# Patient Record
Sex: Male | Born: 1980 | Race: Black or African American | Hispanic: No | Marital: Single | Smoking: Never smoker
Health system: Southern US, Community
[De-identification: ages and names within clinical notes are randomized; demographics above are authoritative.]

## PROBLEM LIST (undated history)

## (undated) DIAGNOSIS — S92902A Unspecified fracture of left foot, initial encounter for closed fracture: Secondary | ICD-10-CM

## (undated) DIAGNOSIS — E119 Type 2 diabetes mellitus without complications: Secondary | ICD-10-CM

## (undated) DIAGNOSIS — T402X5A Adverse effect of other opioids, initial encounter: Secondary | ICD-10-CM

## (undated) DIAGNOSIS — F200 Paranoid schizophrenia: Secondary | ICD-10-CM

## (undated) DIAGNOSIS — I10 Essential (primary) hypertension: Secondary | ICD-10-CM

## (undated) DIAGNOSIS — S92901A Unspecified fracture of right foot, initial encounter for closed fracture: Secondary | ICD-10-CM

## (undated) DIAGNOSIS — K5903 Drug induced constipation: Secondary | ICD-10-CM

## (undated) HISTORY — DX: Essential (primary) hypertension: I10

## (undated) HISTORY — DX: Type 2 diabetes mellitus without complications: E11.9

## (undated) HISTORY — DX: Paranoid schizophrenia: F20.0

## (undated) HISTORY — DX: Adverse effect of other opioids, initial encounter: T40.2X5A

## (undated) HISTORY — DX: Unspecified fracture of right foot, initial encounter for closed fracture: S92.901A

## (undated) HISTORY — DX: Unspecified fracture of left foot, initial encounter for closed fracture: S92.902A

## (undated) HISTORY — DX: Drug induced constipation: K59.03

---

## 1999-08-04 ENCOUNTER — Emergency Department (HOSPITAL_COMMUNITY): Admission: EM | Admit: 1999-08-04 | Discharge: 1999-08-05 | Payer: Self-pay

## 2007-04-28 ENCOUNTER — Inpatient Hospital Stay (HOSPITAL_COMMUNITY): Admission: EM | Admit: 2007-04-28 | Discharge: 2007-05-04 | Payer: Self-pay | Admitting: Psychiatry

## 2007-05-09 ENCOUNTER — Ambulatory Visit: Payer: Self-pay | Admitting: Psychiatry

## 2007-06-14 ENCOUNTER — Emergency Department (HOSPITAL_COMMUNITY): Admission: EM | Admit: 2007-06-14 | Discharge: 2007-06-14 | Payer: Self-pay | Admitting: Emergency Medicine

## 2007-07-01 ENCOUNTER — Emergency Department (HOSPITAL_COMMUNITY): Admission: EM | Admit: 2007-07-01 | Discharge: 2007-07-02 | Payer: Self-pay | Admitting: Emergency Medicine

## 2008-03-10 ENCOUNTER — Ambulatory Visit: Payer: Self-pay | Admitting: Psychiatry

## 2008-03-10 ENCOUNTER — Emergency Department (HOSPITAL_COMMUNITY): Admission: EM | Admit: 2008-03-10 | Discharge: 2008-03-11 | Payer: Self-pay | Admitting: Emergency Medicine

## 2008-03-11 ENCOUNTER — Inpatient Hospital Stay (HOSPITAL_COMMUNITY): Admission: RE | Admit: 2008-03-11 | Discharge: 2008-03-17 | Payer: Self-pay | Admitting: Psychiatry

## 2010-05-31 LAB — CBC
HCT: 47.1 % (ref 39.0–52.0)
MCV: 87.6 fL (ref 78.0–100.0)
RBC: 5.37 MIL/uL (ref 4.22–5.81)
WBC: 9.5 10*3/uL (ref 4.0–10.5)

## 2010-05-31 LAB — COMPREHENSIVE METABOLIC PANEL
AST: 21 U/L (ref 0–37)
Alkaline Phosphatase: 60 U/L (ref 39–117)
BUN: 15 mg/dL (ref 6–23)
CO2: 25 mEq/L (ref 19–32)
Chloride: 104 mEq/L (ref 96–112)
Creatinine, Ser: 0.96 mg/dL (ref 0.4–1.5)
GFR calc non Af Amer: >60 mL/min (ref >60)
Potassium: 2.9 mEq/L — ABNORMAL LOW (ref 3.5–5.1)
Total Bilirubin: 1.9 mg/dL — ABNORMAL HIGH (ref 0.3–1.2)

## 2010-05-31 LAB — DIFFERENTIAL
Basophils Absolute: 0.1 10*3/uL (ref 0.0–0.1)
Basophils Relative: 1 % (ref 0–1)
Eosinophils Relative: 0 % (ref 0–5)
Lymphocytes Relative: 13 % (ref 12–46)
Neutro Abs: 7.3 10*3/uL (ref 1.7–7.7)

## 2010-05-31 LAB — RAPID URINE DRUG SCREEN, HOSP PERFORMED: Barbiturates: NOT DETECTED

## 2010-06-29 NOTE — Discharge Summary (Signed)
NAME:  Bob Reyes NO.:  [id number]   MEDICAL RECORD NO.:  [id number]          PATIENT TYPE:  IPS   LOCATION:  0407                          FACILITY:  [facility]   PHYSICIAN:  Anselm Jungling, MD  DATE OF BIRTH:  [birth year]   DATE OF ADMISSION:  04/28/2007  DATE OF DISCHARGE:  05/04/2007                               DISCHARGE SUMMARY   IDENTIFYING DATA/REASON FOR ADMISSION:  This was an inpatient  psychiatric admission for Bob Reyes, a 30 year old male who was admitted due  to recurrence of psychosis.  Please refer to the admission note for  further details pertaining to the symptoms, circumstances and history  that led to his hospitalization.  He was given initial an initial Axis I  diagnosis of psychosis NOS.   MEDICAL AND LABORATORY:  The patient was medically and physically  assessed by the psychiatric nurse practitioner.  He was in good health  without any chronic or active medical problems.   HOSPITAL COURSE:  The patient was admitted to the adult inpatient  psychiatric service.  He presented as an alert young man who was very  guarded and suspicious.  He was very slow in responding to questions in  the initial interview.  He appeared to be responding to internal  stimuli.   His family indicated that he had been previously treated at [facility] and had been treated there with Risperdal.   The patient was restarted on Risperdal with a plan for Risperdal 2 mg  q.h.s., and Risperdal Consta 50 mg IM every 2 weeks.  He was given  injections during the course of his inpatient stay with [place].  He was also  given Cogentin 1 mg twice daily.   The patient was initially evaluated by Dr. Geoffery Lyons.  The undersigned  assumed his care on hospital day #4.  On that day, I met with the  patient, and reviewed his care with the case manager and the nursing  staff.  The patient, at that point, continued impulsive, with  inappropriate behaviors.  That day, he tore  down the day room curtains,  not in anger, but for no apparent reason.  He showed very poor judgment  and no insight at all.  He needed one-to-one supervision.  At this  point, Risperdal was increased and Depakote was initiated because of the  appearance of this being a manic psychosis.   Unfortunately, the patient was willing to take Risperdal, but flatly  refused to take Depakote, for reasons that he was not able to make clear  to [place].  Fortunately, he did agree to continuing with Risperdal Consta,  and he did accept such an injection.  He continued highly variable and  impulsive over the next 3-4 days.   On hospital day #7, the patient was significantly improved.  He was  pleasant, calm, and had no complaints.  There were no overt symptoms of  psychosis or mania.  He had a positive family visit the night before,  and had slept well.  He was eating normally.  He was agreeable to  continuing to receive Risperdal Consta.  Following this, he was  discharged, as his family indicated they were that they were comfortable  with him coming home at that point.   AFTERCARE:  The patient was to follow-up at [facility] with an appointment on May 10, 2007, with their psychiatrist.   DISCHARGE MEDICATIONS:  1. Risperdal 2 mg q.h.s.  2. Cogentin 1 mg b.i.d.  3. Risperdal Consta 50 mg IM every 2 weeks, next one due on May 19, 2007.   DISCHARGE DIAGNOSES:  AXIS I:  Schizophrenia, paranoid type.  AXIS II:  Deferred.  AXIS III:  No acute or chronic illnesses.  AXIS IV:  Stressors severe.  AXIS V:  GAF on discharge 55.      Anselm Jungling, MD  Electronically Signed     SPB/MEDQ  D:  05/31/2007  T:  05/31/2007  Job:  [id number]

## 2010-07-02 NOTE — Discharge Summary (Signed)
NAME:  NORLAN, RANN NO.:  [id number]   MEDICAL RECORD NO.:  [id number]          PATIENT TYPE:  IPS   LOCATION:  0403                          FACILITY:  [facility]   PHYSICIAN:  Anselm Jungling, MD  DATE OF BIRTH:  01-12-1981   DATE OF ADMISSION:  03/11/2008  DATE OF DISCHARGE:  03/17/2008                               DISCHARGE SUMMARY   IDENTIFYING DATA AND REASON FOR ADMISSION:  This was the second [facility]  admission for Bob Reyes, a 30 year old single African American male with a  history of chronic schizophrenia.  He was admitted with increasing  symptoms of psychosis, after getting away from taking his medication on  a regular basis.  Please refer to the admission note for further details  pertaining to the symptoms, circumstances and history that led to his  hospitalization.  He was given an initial Axis I diagnosis of  schizophrenia, chronic paranoid type, acute exacerbation.   MEDICAL AND LABORATORY:  The patient was medically and physically  assessed by the psychiatric nurse practitioner.  He was in good health  without any active or chronic medical problems.  There were no  significant medical issues.   HOSPITAL COURSE:  The patient was admitted to the adult inpatient  psychiatric service.  He presented as a tall, slender [place]  male who showed prominent negative symptoms of schizophrenia, with  withdrawal, thought disorganization, latency, suspiciousness, and  guardedness.  However, he was cooperative.  He accepted medication when  offered, and he was restarted on an antipsychotic regimen.  He showed a  good response to Risperdal, which was restarted at a dose of 2 mg  nightly, and Risperdal Consta 25 mg IM q. 2 weeks which was given during  his stay.   The patient made gradual daily progress, becoming more spontaneous, more  verbal, and showing more in the way of insight and good judgment.   His mother was contacted and discharge and aftercare  planning were  coordinated with her.  He appeared appropriate for discharge on the  eighth hospital day.   DISCHARGE AND AFTERCARE PLANS:  The patient was to follow-up at [facility] with an appointment to see a psychiatrist on  February 2 at 3:00 p.m.   DISCHARGE MEDICATIONS:  1. Risperdal 2 mg nightly.  2. Risperdal Consta 25 mg IM q. 2 weeks, next due March 27, 2008.   DISCHARGE DIAGNOSES:  AXIS I:  Schizophrenia, chronic paranoid type,  acute exacerbation, resolving.  AXIS II:  Deferred.  AXIS III:  No acute or chronic illnesses.  AXIS IV:  Stressors severe.  AXIS V:  Global Assessment of Functioning on discharge 65.   It should be noted that the patient did have some asymptomatic  tachycardia during his stay.  Electrocardiogram did not show any acute  abnormality.  The patient was to follow-up with his usual medical  Teola Felipe regarding this.      Anselm Jungling, MD  Electronically Signed     SPB/MEDQ  D:  03/19/2008  T:  03/19/2008  Job:  [phone]

## 2010-10-15 ENCOUNTER — Emergency Department (HOSPITAL_COMMUNITY)
Admission: EM | Admit: 2010-10-15 | Discharge: 2010-10-15 | Discharge status: Against Medical Advice | Payer: Self-pay | Attending: Emergency Medicine | Admitting: Emergency Medicine

## 2010-11-08 LAB — COMPREHENSIVE METABOLIC PANEL
BUN: 10
Chloride: 103
GFR calc non Af Amer: >60
Potassium: 3.3 — ABNORMAL LOW
Sodium: 140

## 2010-11-08 LAB — CBC
HCT: 44.5
Platelets: 291
RDW: 12.8
WBC: 10.6 — ABNORMAL HIGH

## 2010-11-08 LAB — MAGNESIUM: Magnesium: 2.2

## 2010-11-09 LAB — CBC
HCT: 43.5
Hemoglobin: 15.3
MCHC: 35.2
RDW: 12.7

## 2010-11-09 LAB — DIFFERENTIAL
Basophils Absolute: 0
Eosinophils Relative: 1
Lymphocytes Relative: 19
Monocytes Absolute: 0.6

## 2010-11-09 LAB — BASIC METABOLIC PANEL
CO2: 28
GFR calc non Af Amer: >60
Glucose, Bld: 116 — ABNORMAL HIGH
Potassium: 4.2
Sodium: 137

## 2010-11-09 LAB — RAPID URINE DRUG SCREEN, HOSP PERFORMED
Barbiturates: NOT DETECTED
Opiates: NOT DETECTED

## 2010-11-10 LAB — COMPREHENSIVE METABOLIC PANEL
ALT: 17
BUN: 12
Calcium: 9.8
Creatinine, Ser: 0.96
Glucose, Bld: 106 — ABNORMAL HIGH
Sodium: 137
Total Protein: 7.7

## 2010-11-10 LAB — COMPREHENSIVE METABOLIC PANEL WITH GFR
AST: 19
Albumin: 4.6
Alkaline Phosphatase: 65
CO2: 25
Chloride: 101
GFR calc Af Amer: >60
GFR calc non Af Amer: >60
Potassium: 3.8
Total Bilirubin: 1.2

## 2010-11-10 LAB — RAPID URINE DRUG SCREEN, HOSP PERFORMED
Amphetamines: NOT DETECTED
Barbiturates: NOT DETECTED
Benzodiazepines: NOT DETECTED
Cocaine: NOT DETECTED
Opiates: NOT DETECTED
Tetrahydrocannabinol: NOT DETECTED

## 2010-11-10 LAB — CBC
HCT: 50.2
Hemoglobin: 16.9
MCHC: 33.7
MCV: 85.9
Platelets: 236
RBC: 5.84 — ABNORMAL HIGH
RDW: 12.7
WBC: 9.3

## 2010-11-10 LAB — DIFFERENTIAL
Basophils Absolute: 0
Basophils Relative: 0
Eosinophils Absolute: 0
Eosinophils Relative: 0
Lymphocytes Relative: 9 — ABNORMAL LOW
Lymphs Abs: 0.9
Monocytes Absolute: 0.5
Monocytes Relative: 5
Neutro Abs: 8 — ABNORMAL HIGH
Neutrophils Relative %: 86 — ABNORMAL HIGH

## 2010-11-10 LAB — URINALYSIS, ROUTINE W REFLEX MICROSCOPIC
Bilirubin Urine: NEGATIVE
Glucose, UA: NEGATIVE
Hgb urine dipstick: NEGATIVE
Ketones, ur: 15 — AB
Nitrite: NEGATIVE
Protein, ur: NEGATIVE
Specific Gravity, Urine: 1.028
Urobilinogen, UA: 1
pH: 6.5

## 2010-11-10 LAB — ETHANOL: Alcohol, Ethyl (B): <5

## 2014-05-31 ENCOUNTER — Encounter (HOSPITAL_COMMUNITY): Payer: Self-pay | Admitting: Emergency Medicine

## 2014-05-31 ENCOUNTER — Emergency Department (HOSPITAL_COMMUNITY)
Admission: EM | Admit: 2014-05-31 | Discharge: 2014-05-31 | Disposition: A | Discharge status: Home / Self Care | Payer: Medicaid Other | Source: Home / Self Care | Attending: Emergency Medicine | Admitting: Emergency Medicine

## 2014-05-31 DIAGNOSIS — Z760 Encounter for issue of repeat prescription: Secondary | ICD-10-CM | POA: Diagnosis not present

## 2014-05-31 DIAGNOSIS — Z8659 Personal history of other mental and behavioral disorders: Secondary | ICD-10-CM

## 2014-05-31 NOTE — ED Notes (Signed)
Pt is here needing refill on his Zyprex 15 mg for schizophrenic  Has appt w/physchiatrist on Monday Alert, no signs of acute distress

## 2014-05-31 NOTE — ED Provider Notes (Signed)
CSN: [id number]     Arrival date & time 05/31/14  1744 History   First MD Initiated Contact with Patient 05/31/14 1901     Chief Complaint  Patient presents with  . Medication Refill   (Consider location/radiation/quality/duration/timing/severity/associated sxs/prior Treatment) HPI  He is a 34 year old man here for medication question. He is on Zyprexa 15 mg daily. His current prescription has run out. He does have an appointment with his psychiatrist on Monday. He has some old pills, but they have expired 02/24/2014. He is wondering if he can take these today and tomorrow until he sees his psychiatrist.  History reviewed. No pertinent past medical history. History reviewed. No pertinent past surgical history. No family history on file. History  Substance Use Topics  . Smoking status: Never Smoker   . Smokeless tobacco: Not on file  . Alcohol Use: No    Review of Systems Negative Allergies  Review of patient's allergies indicates no known allergies.  Home Medications   Prior to Admission medications   Medication Sig Start Date End Date Taking? Authorizing Provider  OLANZapine (ZYPREXA) 15 MG tablet Take 15 mg by mouth at bedtime.   Yes Historical Provider, MD   BP 145/98 mmHg  Pulse 106  Temp(Src) 98.4 F (36.9 C) (Oral)  Resp 16  SpO2 100% Physical Exam  Constitutional: He is oriented to person, place, and time. He appears well-developed and well-nourished. No distress.  Cardiovascular: Normal rate.   Pulmonary/Chest: Effort normal.  Neurological: He is alert and oriented to person, place, and time.    ED Course  Procedures (including critical care time) Labs Review Labs Reviewed - No data to display  Imaging Review No results found.   MDM   1. Medication refill    Okay to use the Zyprexa tablets. Follow-up with psychiatrist as scheduled on Monday.    Charm RingsErin J Ion Gonnella, MD 05/31/14 [id number]

## 2014-05-31 NOTE — Discharge Instructions (Signed)
It is okay to use the Zyprexa. Follow-up with your psychiatrist as scheduled on Monday.

## 2016-06-27 ENCOUNTER — Encounter (HOSPITAL_COMMUNITY): Payer: Self-pay

## 2016-06-27 ENCOUNTER — Inpatient Hospital Stay (HOSPITAL_COMMUNITY)
Admission: EM | Admit: 2016-06-27 | Discharge: 2016-07-04 | DRG: 504 | Disposition: A | Discharge status: Skilled Nursing Facility | Payer: Medicaid Other | Attending: Oncology | Admitting: Oncology

## 2016-06-27 ENCOUNTER — Emergency Department (HOSPITAL_COMMUNITY): Payer: Medicaid Other

## 2016-06-27 ENCOUNTER — Inpatient Hospital Stay (HOSPITAL_COMMUNITY): Payer: Medicaid Other

## 2016-06-27 DIAGNOSIS — M25579 Pain in unspecified ankle and joints of unspecified foot: Secondary | ICD-10-CM | POA: Diagnosis present

## 2016-06-27 DIAGNOSIS — S92901A Unspecified fracture of right foot, initial encounter for closed fracture: Secondary | ICD-10-CM

## 2016-06-27 DIAGNOSIS — R5082 Postprocedural fever: Secondary | ICD-10-CM | POA: Diagnosis not present

## 2016-06-27 DIAGNOSIS — S92062A Displaced intraarticular fracture of left calcaneus, initial encounter for closed fracture: Principal | ICD-10-CM | POA: Diagnosis present

## 2016-06-27 DIAGNOSIS — S92061A Displaced intraarticular fracture of right calcaneus, initial encounter for closed fracture: Secondary | ICD-10-CM | POA: Diagnosis not present

## 2016-06-27 DIAGNOSIS — S92902A Unspecified fracture of left foot, initial encounter for closed fracture: Secondary | ICD-10-CM

## 2016-06-27 DIAGNOSIS — S92001A Unspecified fracture of right calcaneus, initial encounter for closed fracture: Secondary | ICD-10-CM | POA: Diagnosis not present

## 2016-06-27 DIAGNOSIS — W134XXA Fall from, out of or through window, initial encounter: Secondary | ICD-10-CM | POA: Diagnosis present

## 2016-06-27 DIAGNOSIS — I10 Essential (primary) hypertension: Secondary | ICD-10-CM | POA: Diagnosis present

## 2016-06-27 DIAGNOSIS — S92002A Unspecified fracture of left calcaneus, initial encounter for closed fracture: Secondary | ICD-10-CM

## 2016-06-27 DIAGNOSIS — W19XXXA Unspecified fall, initial encounter: Secondary | ICD-10-CM

## 2016-06-27 DIAGNOSIS — Z91040 Latex allergy status: Secondary | ICD-10-CM

## 2016-06-27 DIAGNOSIS — W1789XD Other fall from one level to another, subsequent encounter: Secondary | ICD-10-CM | POA: Diagnosis not present

## 2016-06-27 DIAGNOSIS — S92002D Unspecified fracture of left calcaneus, subsequent encounter for fracture with routine healing: Secondary | ICD-10-CM | POA: Diagnosis not present

## 2016-06-27 DIAGNOSIS — W1789XA Other fall from one level to another, initial encounter: Secondary | ICD-10-CM

## 2016-06-27 DIAGNOSIS — N62 Hypertrophy of breast: Secondary | ICD-10-CM | POA: Diagnosis not present

## 2016-06-27 DIAGNOSIS — Y92008 Other place in unspecified non-institutional (private) residence as the place of occurrence of the external cause: Secondary | ICD-10-CM | POA: Diagnosis not present

## 2016-06-27 DIAGNOSIS — F309 Manic episode, unspecified: Secondary | ICD-10-CM | POA: Diagnosis not present

## 2016-06-27 DIAGNOSIS — Z79899 Other long term (current) drug therapy: Secondary | ICD-10-CM | POA: Diagnosis not present

## 2016-06-27 DIAGNOSIS — T1491XA Suicide attempt, initial encounter: Secondary | ICD-10-CM | POA: Diagnosis not present

## 2016-06-27 DIAGNOSIS — Y9339 Activity, other involving climbing, rappelling and jumping off: Secondary | ICD-10-CM | POA: Diagnosis not present

## 2016-06-27 DIAGNOSIS — R000 Tachycardia, unspecified: Secondary | ICD-10-CM | POA: Diagnosis not present

## 2016-06-27 DIAGNOSIS — F209 Schizophrenia, unspecified: Secondary | ICD-10-CM

## 2016-06-27 DIAGNOSIS — F200 Paranoid schizophrenia: Secondary | ICD-10-CM | POA: Diagnosis present

## 2016-06-27 DIAGNOSIS — R9431 Abnormal electrocardiogram [ECG] [EKG]: Secondary | ICD-10-CM | POA: Diagnosis not present

## 2016-06-27 DIAGNOSIS — L818 Other specified disorders of pigmentation: Secondary | ICD-10-CM | POA: Diagnosis not present

## 2016-06-27 DIAGNOSIS — X80XXXA Intentional self-harm by jumping from a high place, initial encounter: Secondary | ICD-10-CM | POA: Diagnosis not present

## 2016-06-27 DIAGNOSIS — Z419 Encounter for procedure for purposes other than remedying health state, unspecified: Secondary | ICD-10-CM

## 2016-06-27 DIAGNOSIS — S92001D Unspecified fracture of right calcaneus, subsequent encounter for fracture with routine healing: Secondary | ICD-10-CM | POA: Diagnosis not present

## 2016-06-27 DIAGNOSIS — Y92009 Unspecified place in unspecified non-institutional (private) residence as the place of occurrence of the external cause: Secondary | ICD-10-CM

## 2016-06-27 LAB — CBC
HCT: 45.3 % (ref 39.0–52.0)
HEMOGLOBIN: 16.1 g/dL (ref 13.0–17.0)
MCH: 29.5 pg (ref 26.0–34.0)
MCHC: 35.5 g/dL (ref 30.0–36.0)
MCV: 83.1 fL (ref 78.0–100.0)
PLATELETS: 271 10*3/uL (ref 150–400)
RBC: 5.45 MIL/uL (ref 4.22–5.81)
RDW: 13 % (ref 11.5–15.5)
WBC: 18.2 10*3/uL — ABNORMAL HIGH (ref 4.0–10.5)

## 2016-06-27 LAB — BASIC METABOLIC PANEL
ANION GAP: 14 (ref 5–15)
BUN: 12 mg/dL (ref 6–20)
CALCIUM: 9.7 mg/dL (ref 8.9–10.3)
CO2: 22 mmol/L (ref 22–32)
Chloride: 103 mmol/L (ref 101–111)
Creatinine, Ser: 1.22 mg/dL (ref 0.61–1.24)
GFR calc Af Amer: >60 mL/min (ref >60)
GLUCOSE: 77 mg/dL (ref 65–99)
POTASSIUM: 3.6 mmol/L (ref 3.5–5.1)
SODIUM: 139 mmol/L (ref 135–145)

## 2016-06-27 LAB — DIFFERENTIAL
BASOS PCT: 0 %
Basophils Absolute: 0 10*3/uL (ref 0.0–0.1)
EOS PCT: 0 %
Eosinophils Absolute: 0 10*3/uL (ref 0.0–0.7)
LYMPHS PCT: 13 %
Lymphs Abs: 2 10*3/uL (ref 0.7–4.0)
MONO ABS: 0.7 10*3/uL (ref 0.1–1.0)
Monocytes Relative: 5 %
Neutro Abs: 12.7 10*3/uL — ABNORMAL HIGH (ref 1.7–7.7)
Neutrophils Relative %: 82 %

## 2016-06-27 LAB — ETHANOL

## 2016-06-27 MED ORDER — IBUPROFEN 800 MG PO TABS
800.0000 mg | ORAL_TABLET | Freq: Once | ORAL | Status: AC
  Administered 2016-06-27: 800 mg via ORAL
  Filled 2016-06-27: qty 1

## 2016-06-27 MED ORDER — HYDROCODONE-ACETAMINOPHEN 5-325 MG PO TABS
1.0000 | ORAL_TABLET | ORAL | Status: DC | PRN
  Administered 2016-06-28 – 2016-06-29 (×3): 1 via ORAL
  Filled 2016-06-27 (×3): qty 1

## 2016-06-27 MED ORDER — ACETAMINOPHEN 325 MG PO TABS: 650.0000 mg | ORAL_TABLET | Freq: Four times a day (QID) | ORAL | Status: DC | PRN

## 2016-06-27 MED ORDER — OXYCODONE HCL 5 MG PO TABS: 5.0000 mg | ORAL_TABLET | Freq: Once | ORAL | Status: DC

## 2016-06-27 MED ORDER — ACETAMINOPHEN 650 MG RE SUPP: 650.0000 mg | Freq: Four times a day (QID) | RECTAL | Status: DC | PRN

## 2016-06-27 MED ORDER — HEPARIN SODIUM (PORCINE) 5000 UNIT/ML IJ SOLN
5000.0000 [IU] | Freq: Three times a day (TID) | INTRAMUSCULAR | Status: DC
  Administered 2016-06-27 – 2016-06-29 (×5): 5000 [IU] via SUBCUTANEOUS
  Filled 2016-06-27 (×5): qty 1

## 2016-06-27 MED ORDER — OLANZAPINE 5 MG PO TBDP
10.0000 mg | ORAL_TABLET | Freq: Every morning | ORAL | Status: DC
  Administered 2016-06-28 – 2016-07-04 (×8): 10 mg via ORAL
  Filled 2016-06-27 (×8): qty 2
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
## 2016-06-27 NOTE — ED Notes (Signed)
Attempted report 
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
## 2016-06-27 NOTE — ED Notes (Signed)
Patient transported to X-ray 

## 2016-06-27 NOTE — Progress Notes (Signed)
Patient ID: Bob Reyes, male   DOB: 06/12/1980, 36 y.o.   MRN: [id number] Full consult to follow . Needs dressing by Ortho tech, their call back pending, needs elevation of both feet above heart and NWB.   Will be scheduled for surgery Wed. Afternoon on one foot and the other Friday. My cell [phone]
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
## 2016-06-27 NOTE — Progress Notes (Signed)
Orthopedic Tech Progress Note Patient Details:  Claris GladdenJosh M Lufkin November 20, 1980 [id number]  Ortho Devices Type of Ortho Device: Lenora BoysWatson Jones splint Ortho Device/Splint Location: As requested by Dr. Ophelia CharterYates applied Lenora BoysWatson Jones splints to pt right and left feet. Pt tolerated well.  Mother/family at bedside at the time.   (left and right feet).  Ortho Device/Splint Interventions: Application   Alvina ChouWilliams, Anhad Sheeley C 06/27/2016, 8:39 PM

## 2016-06-28 ENCOUNTER — Encounter (HOSPITAL_COMMUNITY): Payer: Self-pay | Admitting: *Deleted

## 2016-06-28 DIAGNOSIS — S92001A Unspecified fracture of right calcaneus, initial encounter for closed fracture: Secondary | ICD-10-CM

## 2016-06-28 DIAGNOSIS — F200 Paranoid schizophrenia: Secondary | ICD-10-CM

## 2016-06-28 DIAGNOSIS — S92002A Unspecified fracture of left calcaneus, initial encounter for closed fracture: Secondary | ICD-10-CM

## 2016-06-28 DIAGNOSIS — T1491XA Suicide attempt, initial encounter: Secondary | ICD-10-CM

## 2016-06-28 DIAGNOSIS — R9431 Abnormal electrocardiogram [ECG] [EKG]: Secondary | ICD-10-CM

## 2016-06-28 DIAGNOSIS — X80XXXA Intentional self-harm by jumping from a high place, initial encounter: Secondary | ICD-10-CM

## 2016-06-28 DIAGNOSIS — L818 Other specified disorders of pigmentation: Secondary | ICD-10-CM

## 2016-06-28 HISTORY — DX: Paranoid schizophrenia: F20.0

## 2016-06-28 LAB — RAPID URINE DRUG SCREEN, HOSP PERFORMED
Amphetamines: NOT DETECTED
Barbiturates: NOT DETECTED
Benzodiazepines: NOT DETECTED
Cocaine: NOT DETECTED
Opiates: NOT DETECTED
Tetrahydrocannabinol: NOT DETECTED

## 2016-06-28 LAB — HIV ANTIBODY (ROUTINE TESTING W REFLEX): HIV SCREEN 4TH GENERATION: NONREACTIVE

## 2016-06-28 NOTE — Plan of Care (Signed)
Problem: Education: Goal: Knowledge of [facility] Education information/materials will improve Outcome: Progressing POC reviewed with pt./mother.   

## 2016-06-28 NOTE — Progress Notes (Signed)
Subjective:   Procedure(s) (LRB): OPEN REDUCTION INTERNAL FIXATION (ORIF) CALCANEOUS FRACTURE (Left) Patient reports pain as moderate and severe.  Feet were not elevated  above heart level.   Objective: Vital signs in last 24 hours: Temp:  [98.1 F (36.7 C)-100.2 F (37.9 C)] 98.1 F (36.7 C) (05/15 0445) Pulse Rate:  [93-120] 93 (05/15 0445) Resp:  [13-26] 18 (05/15 0445) BP: (137-150)/(85-110) 139/85 (05/15 0445) SpO2:  [95 %-99 %] 99 % (05/15 0445)  Intake/Output from previous day: No intake/output data recorded. Intake/Output this shift: No intake/output data recorded.   Recent Labs  06/27/16 1837  HGB 16.1    Recent Labs  06/27/16 1837  WBC 18.2*  RBC 5.45  HCT 45.3  PLT 271    Recent Labs  06/27/16 1837  NA 139  K 3.6  CL 103  CO2 22  BUN 12  CREATININE 1.22  GLUCOSE 77  CALCIUM 9.7   No results for input(s): LABPT, INR in the last 72 hours.  Neurologically intact Dg Lumbar Spine Complete  Result Date: 06/27/2016 CLINICAL DATA:  Jumped from a 9 foot balcony. Lower extremity numbness and pain. EXAM: LUMBAR SPINE - COMPLETE 4+ VIEW COMPARISON:  None. FINDINGS: Five non rib-bearing lumbar-type vertebral bodies are intact and aligned with maintenance of the lumbar lordosis. Intervertebral disc heights are normal. No destructive bony lesions. Sacroiliac joints are symmetric. Included prevertebral and paraspinal soft tissue planes are non-suspicious. IMPRESSION: Negative. Electronically Signed   By: Awilda Metro M.D.   On: 06/27/2016 17:49   Dg Tibia/fibula Left  Result Date: 06/27/2016 CLINICAL DATA:  Patient reports jumping from a 11ft balcony, reports immediately after his left and right tib/fib were numb, reports now his pain is distal to both of his tib/fibs. EXAM: LEFT TIBIA AND FIBULA - 2 VIEW COMPARISON:  None. FINDINGS: No fracture of the tibia or fibula. Knee joint and ankle joint appear normal on two views. On the lateral view there is a  fracture of the calcaneus which is complex involving the posterior calcaneus as well as the neck of the calcaneus with depression of the subtalar surface. IMPRESSION: 1. Complex fracture of the calcaneus.  Recommend CT of the hindfoot. 2. No tibial fracture. Electronically Signed   By: Genevive Bi M.D.   On: 06/27/2016 16:13   Dg Tibia/fibula Right  Result Date: 06/27/2016 CLINICAL DATA:  Patient reports jumping from a 52ft balcony, reports immediately after his left and right tib/fib were numb, reports now his pain is distal to both of his tib/fibs. EXAM: RIGHT TIBIA AND FIBULA - 2 VIEW COMPARISON:  None. FINDINGS: No acute fracture or dislocation of the right tibia or fibula. No knee joint effusion. Severely comminuted fracture of the anterior, mid and posterior right calcaneus. Relative widening of the posterior subtalar joint. IMPRESSION: 1.  No acute osseous injury of the right tibia and fibula. 2. Severely comminuted fracture of the anterior, mid and posterior calcaneus. Electronically Signed   By: Elige Ko   On: 06/27/2016 16:09   Ct Foot Left Wo Contrast  Result Date: 06/27/2016 CLINICAL DATA:  Bilateral foot pain after jumping out from balcony. EXAM: CT OF THE LEFT FOOT WITHOUT CONTRAST CT OF THE RIGHT FOOT WITHOUT CONTRAST TECHNIQUE: Multidetector CT imaging of the both feet were performed according to the standard protocol. Multiplanar CT image reconstructions were also generated. COMPARISON:  Same day radiographs of the feet FINDINGS: RIGHT FOOT: An acute, comminuted Sanders type 3 AC fracture of the calcaneus with intra-articular  involvement of the posterior articular facet of the subtalar joint is noted with depressed middle component up to 5 mm. Fracture lucencies also extend into the calcaneocuboid articulation as well as the sinus tarsi without encroachment. Ligaments Suboptimally assessed by CT. Muscles and Tendons No intramuscular hemorrhage. The peroneal tendons are intact without  encroachment or entrapment. Medial tendons crossing the ankle joint are without definite entrapment. Extensor tendons crossing the ankle joint are unremarkable. Soft tissues Expected soft tissue swelling about the ankle and hindfoot secondary to the comminuted calcaneal fracture. No abnormal fluid collections. LEFT FOOT: An acute, comminuted Sanders type 3 AB fracture of the calcaneus is noted with intra-articular extension into the subtalar and calcaneocuboid articulations. Slight varus deformity of the calcaneus secondary to the fracture, series 12, image 162. The main fracture fragment is slightly depressed on the coronal images along its medial aspect by 5 mm as well. Remainder of the visualized mid foot, metatarsals and toes are intact. The distal tibia and fibula are unremarkable. The ankle mortise is congruent. Ligaments Suboptimally assessed by CT. Muscle and tendons The medial and lateral tendons crossing the ankle joint do not appear entrapped by the fracture fragments. Soft tissues: Expected soft tissue swelling about the ankle and hindfoot secondary to the comminuted calcaneal fractures. IMPRESSION: 1. Acute, closed, comminuted Sanders type 3 AC fracture the calcaneus with intra-articular involvement of the subtalar and calcaneocuboid articulations. Approximately 5 mm depression is noted of the central calcaneal fracture fragment. 2. Acute, closed, comminuted Sanders type 3 AB fracture of the calcaneus with intra-articular extension into the subtalar and calcaneocuboid articulations and also demonstrating 5 mm of depression along the medial aspect of the main central calcaneal fracture fragment. Electronically Signed   By: Tollie Eth M.D.   On: 06/27/2016 19:19   Ct Foot Right Wo Contrast  Result Date: 06/27/2016 CLINICAL DATA:  Bilateral foot pain after jumping out from balcony. EXAM: CT OF THE LEFT FOOT WITHOUT CONTRAST CT OF THE RIGHT FOOT WITHOUT CONTRAST TECHNIQUE: Multidetector CT imaging of  the both feet were performed according to the standard protocol. Multiplanar CT image reconstructions were also generated. COMPARISON:  Same day radiographs of the feet FINDINGS: RIGHT FOOT: An acute, comminuted Sanders type 3 AC fracture of the calcaneus with intra-articular involvement of the posterior articular facet of the subtalar joint is noted with depressed middle component up to 5 mm. Fracture lucencies also extend into the calcaneocuboid articulation as well as the sinus tarsi without encroachment. Ligaments Suboptimally assessed by CT. Muscles and Tendons No intramuscular hemorrhage. The peroneal tendons are intact without encroachment or entrapment. Medial tendons crossing the ankle joint are without definite entrapment. Extensor tendons crossing the ankle joint are unremarkable. Soft tissues Expected soft tissue swelling about the ankle and hindfoot secondary to the comminuted calcaneal fracture. No abnormal fluid collections. LEFT FOOT: An acute, comminuted Sanders type 3 AB fracture of the calcaneus is noted with intra-articular extension into the subtalar and calcaneocuboid articulations. Slight varus deformity of the calcaneus secondary to the fracture, series 12, image 162. The main fracture fragment is slightly depressed on the coronal images along its medial aspect by 5 mm as well. Remainder of the visualized mid foot, metatarsals and toes are intact. The distal tibia and fibula are unremarkable. The ankle mortise is congruent. Ligaments Suboptimally assessed by CT. Muscle and tendons The medial and lateral tendons crossing the ankle joint do not appear entrapped by the fracture fragments. Soft tissues: Expected soft tissue swelling about the ankle and  hindfoot secondary to the comminuted calcaneal fractures. IMPRESSION: 1. Acute, closed, comminuted Sanders type 3 AC fracture the calcaneus with intra-articular involvement of the subtalar and calcaneocuboid articulations. Approximately 5 mm  depression is noted of the central calcaneal fracture fragment. 2. Acute, closed, comminuted Sanders type 3 AB fracture of the calcaneus with intra-articular extension into the subtalar and calcaneocuboid articulations and also demonstrating 5 mm of depression along the medial aspect of the main central calcaneal fracture fragment. Electronically Signed   By: Tollie Ethavid  Kwon M.D.   On: 06/27/2016 19:19   Dg Foot Complete Left  Result Date: 06/27/2016 CLINICAL DATA:  Jumping from a balcony, left foot pain EXAM: LEFT FOOT - COMPLETE 3+ VIEW COMPARISON:  06/27/2016 FINDINGS: There is an acute comminuted fracture of the calcaneus extending into the subtalar joint articular surface. Lateral view is oblique but the calcaneus is severely flattened and deformed. The talus appears impacted upon the fractured calcaneus. No subluxation or dislocation. Diffuse soft tissue swelling. IMPRESSION: Comminuted calcaneal fracture involving the subtalar joint with soft tissue swelling. Electronically Signed   By: Judie PetitM.  Shick M.D.   On: 06/27/2016 16:16   Dg Foot Complete Right  Result Date: 06/27/2016 CLINICAL DATA:  Patient reports jumping from a 889ft balcony, reports immediately after his left and right tib/fib were numb, reports now his pain is distal to both of his tib/fibs. EXAM: RIGHT FOOT COMPLETE - 3+ VIEW COMPARISON:  None. FINDINGS: There is a comminuted fracture of the calcaneus, not well-defined due to positioning on the lateral view. This appears have an intra-articular component. No other fractures. The calcaneal articulations are not well visualized in this exam. Remaining foot articulations are normally spaced and aligned. There is diffuse mid and hindfoot soft tissue swelling. IMPRESSION: Comminuted fracture of the calcaneus not well visualized on this exam. Recommend follow-up CT for further characterization of the fracture. Electronically Signed   By: Amie Portlandavid  Ormond M.D.   On: 06/27/2016 16:12     Assessment/Plan:   Procedure(s) (LRB): OPEN REDUCTION INTERNAL FIXATION (ORIF) CALCANEOUS FRACTURE (Left) Plan:  Surgery left foot on Wednesday.   Eldred MangesMark C Duilio Heritage 06/28/2016, 7:32 AM
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
## 2016-06-28 NOTE — Progress Notes (Signed)
Subjective: Currently, the patient feels ok. Denies significant pain. Concerned about sedation, but understands need for surgery. Has feet elevated in bed, but concerned that he will not be able to use the bathroom.  Interval Events: Bilateral foot/ankle splints placed. Surgery L foot on Wed. Surgery R foot on Friday.  Objective: Vital signs in last 24 hours: Vitals:   06/27/16 1815 06/27/16 1830 06/27/16 2150 06/28/16 0445  BP:  (!) 147/110 (!) 137/97 139/85  Pulse: (!) 107 96 (!) 103 93  Resp: (!) 26 (!) 24 20 18   Temp:   99 F (37.2 C) 98.1 F (36.7 C)  TempSrc:   Oral Oral  SpO2: 98% 97% 99% 99%   Physical Exam: Physical Exam  Constitutional: He is oriented to person, place, and time. He appears well-developed and well-nourished. He is cooperative. No distress.  HENT:  Head: Normocephalic and atraumatic.  Right Ear: Hearing normal.  Left Ear: Hearing normal.  Nose: Nose normal.  Mouth/Throat: Mucous membranes are normal.  Cardiovascular: Normal rate, regular rhythm, S1 normal, S2 normal and intact distal pulses.  Exam reveals no gallop.   No murmur heard. Pulmonary/Chest: Effort normal and breath sounds normal. No respiratory distress. He has no wheezes. He has no rhonchi. He has no rales. He exhibits no tenderness.  Abdominal: Soft. Normal appearance and bowel sounds are normal. He exhibits no ascites. There is no hepatosplenomegaly. There is no tenderness.  Musculoskeletal:  Bilateral feet and ankles splinted. Able to moves toes. Sensation intact. No significant pain on exam.  Neurological: He is alert and oriented to person, place, and time. He has normal strength.  Skin: Skin is warm, dry and intact. He is not diaphoretic.  Psychiatric: His behavior is normal. His mood appears anxious. His speech is tangential (minimally).   Labs: CBC:  Recent Labs Lab 06/27/16 1837 06/27/16 2106  WBC 18.2*  --   NEUTROABS  --  12.7*  HGB 16.1  --   HCT 45.3  --   MCV 83.1   --   PLT 271  --    Metabolic Panel:  Recent Labs Lab 06/27/16 1837  NA 139  K 3.6  CL 103  CO2 22  GLUCOSE 77  BUN 12  CREATININE 1.22  CALCIUM 9.7    Medications: Scheduled Medications: . heparin  5,000 Units Subcutaneous Q8H  . OLANZapine zydis  10 mg Oral q morning - 10a  . oxyCODONE  5 mg Oral Once   PRN Medications: acetaminophen **OR** acetaminophen, HYDROcodone-acetaminophen  Assessment/Plan: Bob Reyes is a 36 y.o. male with schizophrenia who presents with bilateral calcaneal fractures after jumping off a balcony.  1) Schizophrenia: On Zyprexa 10mg , follows with [facility]. Previously on 15mg  1 year ago, but felt to be over-sedated. Mother has concerns that pt is now impulsive and hyper-active. No A/V hallucinations. Denies SI/HI. UDS negative for intoxication. Appreciate Psych assistance. - continue Zyprexa 10mg   2) Bilateral closed, comminuted calcaneal fx: After jumping off a second story balcony. Ortho c/s, appreciate recs, plan ORIF L foot Wed, R foot Fri. Splinted and elevated in bed. Strict non-wt bearing. Pain control NSAID/Percocet PRN, pt has high pain tolerance, comfortable w/o opioids today. Will appreciate CSW assistance with disposition. Pt currently lives on 3rd floot w/o elevator. Mother's residence is not wheelchair accessible. - Appreciate Ortho Recs - pain control w/ NSAIDs/Oxy PRN - PT/OT  Length of Stay: 1 day(s) Dispo: Anticipated discharge after stabilization from 2nd surgery Friday, approx 5-6 days.  Bob Comment,  MD Pager: [phone] (7AM-5PM) 06/28/2016, 9:08 AM

## 2016-06-28 NOTE — Progress Notes (Signed)
  Date: 06/28/2016  Patient name: Bob GladdenJosh M Reyes  Medical record number: [id number]  Date of birth: January 26, 1981   I have seen and evaluated Bob Reyes and discussed their care with the Residency Team. Mr. Bob GumsWinnex is a 36 year old man who presented to the ED with chief complaint of inability to walk after jumping off of a second floor balcony. I have read Dr. Alm BustardStrelow's H&P and we did not get any additional history today to supplement his documentation.  This morning, the patient is feeling well without significant pain. He only required ibuprofen overnight for pain control. He was requesting permission to get in a wheelchair to go to the toilet in the bathroom instead of using a bedpan. He is nervous about undergoing sedation for his surgery but understands the need. His mother was in the room and appreciated our psychiatry consult. We broached disposition plans today. His apartment is on the third floor and does not have an elevator. His mother's home is not wheelchair accessible and he prefers not to live with her at discharge. Her sister works at a SNF type facility and he and his mother are in agreement to looking into a temporary stay at the facility at discharge.  PMHx, Fam Hx, and/or Soc Hx : PMHx sig for schizophrenia. Soc hx : lives in 3rd floor apartment. Goes to [facility]. Never smoked.  Vitals:   06/27/16 2150 06/28/16 0445  BP: (!) 137/97 139/85  Pulse: (!) 103 93  Resp: 20 18  Temp: 99 F (37.2 C) 98.1 F (36.7 C)  NAD, lying with feet above heart Skin : warm and dry, multiple tattoos Neuro : Alert, clear speech but at times nonsensical comments, able to move feet B  I personally viewed his EKG and confirmed by reading with the official read. Sinus, Q I and aVL, RAD  Assessment and Plan: I have seen and evaluated the patient as outlined above. I agree with the formulated Assessment and Plan as detailed in the residents' note, with the following changes:   1. Schizophrenia - He  had a med adjustment to a lower dose one year ago to decrease sedation and increase energy per the mother. Records indicate that he has been stable on this dose. Due to the unusual activity leading to the need for admission and surgery, we will consult psychiatry to ensure he is on appropriate medications and dosages.  2. B calcaneal fx - surgery per orthopedics. Left foot on the 16th and right foot on the 18th. He will be at high risk for DVT and will need appropriate DVT prophylaxis peri and postoperatively.  3. Multiple tattoos - Case control studies have ID'd tattoos as an indep risk factor for Hep C infxn. He has been tested for HIV but not Hep C. Rec Hep C Ab testing.   4. Q waves in I and aVL - he is asymptomatic and low risk for CAD. W/U will not change need for surgical intervention. RCRI of 0 for a peri-operative risk of 0.4-0.5% for cardiac issues. I suspect this is an aberrant EKG.   Burns SpainButcher, Bob A, MD 5/15/201810:19 AM

## 2016-06-29 ENCOUNTER — Inpatient Hospital Stay (HOSPITAL_COMMUNITY): Payer: Medicaid Other

## 2016-06-29 ENCOUNTER — Encounter (HOSPITAL_COMMUNITY)
Admission: EM | Disposition: A | Discharge status: Skilled Nursing Facility | Payer: Self-pay | Source: Home / Self Care | Attending: Internal Medicine

## 2016-06-29 ENCOUNTER — Inpatient Hospital Stay (HOSPITAL_COMMUNITY): Payer: Medicaid Other | Admitting: Anesthesiology

## 2016-06-29 ENCOUNTER — Encounter (HOSPITAL_COMMUNITY): Payer: Self-pay | Admitting: *Deleted

## 2016-06-29 DIAGNOSIS — N62 Hypertrophy of breast: Secondary | ICD-10-CM

## 2016-06-29 DIAGNOSIS — S92902A Unspecified fracture of left foot, initial encounter for closed fracture: Secondary | ICD-10-CM

## 2016-06-29 HISTORY — PX: ORIF CALCANEOUS FRACTURE: SHX5030

## 2016-06-29 LAB — HEPATIC FUNCTION PANEL
ALBUMIN: 4 g/dL (ref 3.5–5.0)
ALT: 31 U/L (ref 17–63)
AST: 25 U/L (ref 15–41)
Alkaline Phosphatase: 70 U/L (ref 38–126)
Bilirubin, Direct: 0.2 mg/dL (ref 0.1–0.5)
Indirect Bilirubin: 1.5 mg/dL — ABNORMAL HIGH (ref 0.3–0.9)
Total Bilirubin: 1.7 mg/dL — ABNORMAL HIGH (ref 0.3–1.2)
Total Protein: 7 g/dL (ref 6.5–8.1)

## 2016-06-29 LAB — LIPID PANEL
Cholesterol: 159 mg/dL (ref 0–200)
HDL: 33 mg/dL — AB (ref >40)
LDL CALC: 90 mg/dL (ref 0–99)
Total CHOL/HDL Ratio: 4.8 RATIO
Triglycerides: 179 mg/dL — ABNORMAL HIGH (ref <150)
VLDL: 36 mg/dL (ref 0–40)

## 2016-06-29 LAB — HCV COMMENT:

## 2016-06-29 LAB — SURGICAL PCR SCREEN
MRSA, PCR: NEGATIVE
Staphylococcus aureus: POSITIVE — AB

## 2016-06-29 LAB — HEPATITIS C ANTIBODY (REFLEX)

## 2016-06-29 SURGERY — OPEN REDUCTION INTERNAL FIXATION (ORIF) CALCANEOUS FRACTURE
Anesthesia: Regional | Laterality: Left

## 2016-06-29 MED ORDER — ASPIRIN 325 MG PO TABS
325.0000 mg | ORAL_TABLET | Freq: Every day | ORAL | Status: DC
  Administered 2016-06-29 – 2016-07-04 (×6): 325 mg via ORAL
  Filled 2016-06-29 (×6): qty 1

## 2016-06-29 MED ORDER — METOCLOPRAMIDE HCL 5 MG PO TABS: 5.0000 mg | ORAL_TABLET | Freq: Three times a day (TID) | ORAL | Status: DC | PRN

## 2016-06-29 MED ORDER — HYDROMORPHONE HCL 1 MG/ML IJ SOLN: 0.5000 mg | INTRAMUSCULAR | Status: DC | PRN

## 2016-06-29 MED ORDER — HEPARIN SODIUM (PORCINE) 5000 UNIT/ML IJ SOLN: 5000.0000 [IU] | Freq: Three times a day (TID) | INTRAMUSCULAR | Status: DC

## 2016-06-29 MED ORDER — POLYETHYLENE GLYCOL 3350 17 G PO PACK: 17.0000 g | PACK | Freq: Every day | ORAL | Status: DC | PRN

## 2016-06-29 MED ORDER — ONDANSETRON HCL 4 MG/2ML IJ SOLN
INTRAMUSCULAR | Status: DC | PRN
  Administered 2016-06-29: 4 mg via INTRAVENOUS

## 2016-06-29 MED ORDER — ONDANSETRON HCL 4 MG PO TABS: 4.0000 mg | ORAL_TABLET | Freq: Four times a day (QID) | ORAL | Status: DC | PRN

## 2016-06-29 MED ORDER — HYDROMORPHONE HCL 1 MG/ML IJ SOLN: 0.2500 mg | INTRAMUSCULAR | Status: DC | PRN

## 2016-06-29 MED ORDER — LACTATED RINGERS IV SOLN
INTRAVENOUS | Status: DC
  Administered 2016-06-29 – 2016-07-01 (×2): via INTRAVENOUS

## 2016-06-29 MED ORDER — FENTANYL CITRATE (PF) 250 MCG/5ML IJ SOLN
INTRAMUSCULAR | Status: AC
  Filled 2016-06-29: qty 5

## 2016-06-29 MED ORDER — ROPIVACAINE HCL 5 MG/ML IJ SOLN
INTRAMUSCULAR | Status: DC | PRN
  Administered 2016-06-29: 30 mL via PERINEURAL

## 2016-06-29 MED ORDER — ACETAMINOPHEN 325 MG PO TABS
650.0000 mg | ORAL_TABLET | Freq: Four times a day (QID) | ORAL | Status: DC | PRN
  Administered 2016-07-01 – 2016-07-03 (×4): 650 mg via ORAL
  Filled 2016-06-29 (×4): qty 2

## 2016-06-29 MED ORDER — SODIUM CHLORIDE 0.45 % IV SOLN: INTRAVENOUS | Status: DC

## 2016-06-29 MED ORDER — FENTANYL CITRATE (PF) 100 MCG/2ML IJ SOLN
50.0000 ug | Freq: Once | INTRAMUSCULAR | Status: AC
  Administered 2016-06-29: 50 ug via INTRAVENOUS

## 2016-06-29 MED ORDER — LIDOCAINE 2% (20 MG/ML) 5 ML SYRINGE
INTRAMUSCULAR | Status: AC
  Filled 2016-06-29: qty 5

## 2016-06-29 MED ORDER — DOCUSATE SODIUM 50 MG/5ML PO LIQD
100.0000 mg | Freq: Two times a day (BID) | ORAL | Status: DC
  Administered 2016-06-29 – 2016-07-01 (×4): 100 mg via ORAL
  Filled 2016-06-29 (×8): qty 10

## 2016-06-29 MED ORDER — ROCURONIUM BROMIDE 10 MG/ML (PF) SYRINGE
PREFILLED_SYRINGE | INTRAVENOUS | Status: AC
  Filled 2016-06-29: qty 5

## 2016-06-29 MED ORDER — DOCUSATE SODIUM 100 MG PO CAPS
100.0000 mg | ORAL_CAPSULE | Freq: Two times a day (BID) | ORAL | Status: DC
  Filled 2016-06-29: qty 1

## 2016-06-29 MED ORDER — FENTANYL CITRATE (PF) 100 MCG/2ML IJ SOLN
INTRAMUSCULAR | Status: DC | PRN
Start: 2016-06-29 — End: 2016-06-29
  Administered 2016-06-29: 100 ug via INTRAVENOUS

## 2016-06-29 MED ORDER — LIDOCAINE 2% (20 MG/ML) 5 ML SYRINGE
INTRAMUSCULAR | Status: DC | PRN
  Administered 2016-06-29: 60 mg via INTRAVENOUS

## 2016-06-29 MED ORDER — METOCLOPRAMIDE HCL 5 MG/ML IJ SOLN: 5.0000 mg | Freq: Three times a day (TID) | INTRAMUSCULAR | Status: DC | PRN

## 2016-06-29 MED ORDER — ONDANSETRON HCL 4 MG/2ML IJ SOLN: 4.0000 mg | Freq: Four times a day (QID) | INTRAMUSCULAR | Status: DC | PRN

## 2016-06-29 MED ORDER — PROPOFOL 10 MG/ML IV BOLUS
INTRAVENOUS | Status: DC | PRN
  Administered 2016-06-29: 200 mg via INTRAVENOUS

## 2016-06-29 MED ORDER — MIDAZOLAM HCL 2 MG/2ML IJ SOLN
2.0000 mg | Freq: Once | INTRAMUSCULAR | Status: AC
  Administered 2016-06-29: 2 mg via INTRAVENOUS

## 2016-06-29 MED ORDER — CEFAZOLIN SODIUM-DEXTROSE 2-4 GM/100ML-% IV SOLN
INTRAVENOUS | Status: AC
  Filled 2016-06-29: qty 100

## 2016-06-29 MED ORDER — DEXAMETHASONE SODIUM PHOSPHATE 10 MG/ML IJ SOLN
INTRAMUSCULAR | Status: AC
  Filled 2016-06-29: qty 1

## 2016-06-29 MED ORDER — EPHEDRINE 5 MG/ML INJ
INTRAVENOUS | Status: AC
  Filled 2016-06-29: qty 10

## 2016-06-29 MED ORDER — OXYCODONE HCL 5 MG PO TABS
5.0000 mg | ORAL_TABLET | ORAL | Status: DC | PRN
  Administered 2016-07-01 – 2016-07-04 (×9): 5 mg via ORAL
  Filled 2016-06-29 (×7): qty 1

## 2016-06-29 MED ORDER — EPHEDRINE SULFATE-NACL 50-0.9 MG/10ML-% IV SOSY
PREFILLED_SYRINGE | INTRAVENOUS | Status: DC | PRN
  Administered 2016-06-29: 10 mg via INTRAVENOUS

## 2016-06-29 MED ORDER — ONDANSETRON HCL 4 MG/2ML IJ SOLN
INTRAMUSCULAR | Status: AC
  Filled 2016-06-29: qty 2

## 2016-06-29 MED ORDER — ROCURONIUM BROMIDE 10 MG/ML (PF) SYRINGE
PREFILLED_SYRINGE | INTRAVENOUS | Status: DC | PRN
  Administered 2016-06-29: 50 mg via INTRAVENOUS

## 2016-06-29 MED ORDER — DEXAMETHASONE SODIUM PHOSPHATE 10 MG/ML IJ SOLN
INTRAMUSCULAR | Status: DC | PRN
  Administered 2016-06-29: 5 mg via INTRAVENOUS

## 2016-06-29 MED ORDER — CEFAZOLIN SODIUM-DEXTROSE 1-4 GM/50ML-% IV SOLN
1.0000 g | Freq: Three times a day (TID) | INTRAVENOUS | Status: AC
  Administered 2016-06-29 – 2016-06-30 (×2): 1 g via INTRAVENOUS
  Filled 2016-06-29 (×2): qty 50

## 2016-06-29 MED ORDER — FENTANYL CITRATE (PF) 100 MCG/2ML IJ SOLN
INTRAMUSCULAR | Status: AC
  Administered 2016-06-29: 50 ug via INTRAVENOUS
  Filled 2016-06-29: qty 2

## 2016-06-29 MED ORDER — CEFAZOLIN SODIUM-DEXTROSE 2-3 GM-% IV SOLR
INTRAVENOUS | Status: DC | PRN
  Administered 2016-06-29: 2 g via INTRAVENOUS

## 2016-06-29 MED ORDER — SUGAMMADEX SODIUM 200 MG/2ML IV SOLN
INTRAVENOUS | Status: AC
  Filled 2016-06-29: qty 2

## 2016-06-29 MED ORDER — ACETAMINOPHEN 650 MG RE SUPP: 650.0000 mg | Freq: Four times a day (QID) | RECTAL | Status: DC | PRN

## 2016-06-29 MED ORDER — MIDAZOLAM HCL 2 MG/2ML IJ SOLN
INTRAMUSCULAR | Status: AC
  Administered 2016-06-29: 2 mg via INTRAVENOUS
  Filled 2016-06-29: qty 2

## 2016-06-29 SURGICAL SUPPLY — 68 items
BANDAGE ACE 4X5 VEL STRL LF (GAUZE/BANDAGES/DRESSINGS) IMPLANT
BANDAGE ACE 6X5 VEL STRL LF (GAUZE/BANDAGES/DRESSINGS) IMPLANT
BANDAGE ESMARK 6X9 LF (GAUZE/BANDAGES/DRESSINGS) IMPLANT
BIT DRILL 2.5X2.75 QC CALB (BIT) ×2 IMPLANT
BNDG CMPR 9X6 STRL LF SNTH (GAUZE/BANDAGES/DRESSINGS)
BNDG COHESIVE 6X5 TAN STRL LF (GAUZE/BANDAGES/DRESSINGS) IMPLANT
BNDG ESMARK 6X9 LF (GAUZE/BANDAGES/DRESSINGS)
CONNECTOR 5 IN 1 STRAIGHT STRL (MISCELLANEOUS) ×2 IMPLANT
COTTON STERILE ROLL (GAUZE/BANDAGES/DRESSINGS) ×2 IMPLANT
COVER MAYO STAND STRL (DRAPES) ×3 IMPLANT
COVER SURGICAL LIGHT HANDLE (MISCELLANEOUS) ×3 IMPLANT
CUFF TOURNIQUET SINGLE 34IN LL (TOURNIQUET CUFF) ×2 IMPLANT
CUFF TOURNIQUET SINGLE 44IN (TOURNIQUET CUFF) IMPLANT
DRAPE C-ARM 42X72 X-RAY (DRAPES) ×2 IMPLANT
DRAPE C-ARMOR (DRAPES) ×2 IMPLANT
DRAPE HALF SHEET 40X57 (DRAPES) ×3 IMPLANT
DRAPE INCISE IOBAN 66X45 STRL (DRAPES) ×1 IMPLANT
DRAPE U-SHAPE 47X51 STRL (DRAPES) ×3 IMPLANT
DRSG PAD ABDOMINAL 8X10 ST (GAUZE/BANDAGES/DRESSINGS) ×3 IMPLANT
DURAPREP 26ML APPLICATOR (WOUND CARE) ×3 IMPLANT
ELECT REM PT RETURN 9FT ADLT (ELECTROSURGICAL) ×3
ELECTRODE REM PT RTRN 9FT ADLT (ELECTROSURGICAL) ×1 IMPLANT
GAUZE SPONGE 4X4 12PLY STRL (GAUZE/BANDAGES/DRESSINGS) ×3 IMPLANT
GAUZE XEROFORM 1X8 LF (GAUZE/BANDAGES/DRESSINGS) ×2 IMPLANT
GAUZE XEROFORM 5X9 LF (GAUZE/BANDAGES/DRESSINGS) ×3 IMPLANT
GLOVE BIOGEL PI IND STRL 8 (GLOVE) ×2 IMPLANT
GLOVE BIOGEL PI INDICATOR 8 (GLOVE) ×4
GLOVE ORTHO TXT STRL SZ7.5 (GLOVE) ×6 IMPLANT
GOWN STRL REUS W/ TWL LRG LVL3 (GOWN DISPOSABLE) ×1 IMPLANT
GOWN STRL REUS W/ TWL XL LVL3 (GOWN DISPOSABLE) ×1 IMPLANT
GOWN STRL REUS W/TWL 2XL LVL3 (GOWN DISPOSABLE) ×3 IMPLANT
GOWN STRL REUS W/TWL LRG LVL3 (GOWN DISPOSABLE) ×3
GOWN STRL REUS W/TWL XL LVL3 (GOWN DISPOSABLE) ×3
KIT BASIN OR (CUSTOM PROCEDURE TRAY) ×3 IMPLANT
KIT ROOM TURNOVER OR (KITS) ×3 IMPLANT
MANIFOLD NEPTUNE II (INSTRUMENTS) ×3 IMPLANT
NS IRRIG 1000ML POUR BTL (IV SOLUTION) ×3 IMPLANT
PACK ORTHO EXTREMITY (CUSTOM PROCEDURE TRAY) ×3 IMPLANT
PAD ARMBOARD 7.5X6 YLW CONV (MISCELLANEOUS) ×6 IMPLANT
PAD CAST 4YDX4 CTTN HI CHSV (CAST SUPPLIES) ×1 IMPLANT
PADDING CAST COTTON 4X4 STRL (CAST SUPPLIES) ×3
PADDING CAST COTTON 6X4 STRL (CAST SUPPLIES) ×3 IMPLANT
PIN 4.5MM CANC THREADED AO (PIN) ×2 IMPLANT
PLATE LOCK LG LT CALC FOOT (Plate) ×2 IMPLANT
SCREW 3.5MM CORT LP 34MM (Screw) ×2 IMPLANT
SCREW CORT 3.5X40 (Screw) ×2 IMPLANT
SCREW CORT T15 30X3.5XST LCK (Screw) IMPLANT
SCREW CORTICAL 3.5X30MM (Screw) ×3 IMPLANT
SCREW CORTICAL LOW PROF 3.5X20 (Screw) ×2 IMPLANT
SCREW LOCK CORT STAR 3.5X20 (Screw) ×2 IMPLANT
SCREW LOCK CORT STAR 3.5X28 (Screw) ×4 IMPLANT
SCREW LOCK CORT STAR 3.5X30 (Screw) ×6 IMPLANT
SCREW LOCK CORT STAR 3.5X38 (Screw) ×2 IMPLANT
SCREW LOCK CORT STAR 3.5X48 (Screw) ×2 IMPLANT
SPONGE LAP 18X18 X RAY DECT (DISPOSABLE) ×3 IMPLANT
STAPLER VISISTAT 35W (STAPLE) IMPLANT
SUCTION FRAZIER HANDLE 10FR (MISCELLANEOUS) ×2
SUCTION TUBE FRAZIER 10FR DISP (MISCELLANEOUS) ×1 IMPLANT
SUT ETHILON 3 0 PS 1 (SUTURE) ×2 IMPLANT
SUT VIC AB 2-0 CT1 27 (SUTURE)
SUT VIC AB 2-0 CT1 TAPERPNT 27 (SUTURE) ×2 IMPLANT
TOWEL OR 17X24 6PK STRL BLUE (TOWEL DISPOSABLE) ×3 IMPLANT
TOWEL OR 17X26 10 PK STRL BLUE (TOWEL DISPOSABLE) ×3 IMPLANT
TUBE CONNECTING 12'X1/4 (SUCTIONS) ×2
TUBE CONNECTING 12X1/4 (SUCTIONS) ×3 IMPLANT
WATER STERILE IRR 1000ML POUR (IV SOLUTION) ×3 IMPLANT
WIRE K 1.6MM 144256 (MISCELLANEOUS) ×12 IMPLANT
YANKAUER SUCT BULB TIP NO VENT (SUCTIONS) ×3 IMPLANT

## 2016-06-29 NOTE — Anesthesia Postprocedure Evaluation (Signed)
Anesthesia Post Note  Patient: Colin BentonJosh M Yokley  Procedure(s) Performed: Procedure(s) (LRB): OPEN REDUCTION INTERNAL FIXATION (ORIF) CALCANEOUS FRACTURE (Left)  Patient location during evaluation: PACU Anesthesia Type: Regional and General Level of consciousness: awake and alert Pain management: pain level controlled Vital Signs Assessment: post-procedure vital signs reviewed and stable Respiratory status: spontaneous breathing, nonlabored ventilation and respiratory function stable Cardiovascular status: blood pressure returned to baseline and stable Postop Assessment: no signs of nausea or vomiting Anesthetic complications: no       Last Vitals:  Vitals:   06/29/16 1645 06/29/16 1700  BP: 139/89 139/89  Pulse: 93 92  Resp: 18 17  Temp:  36.7 C    Last Pain:  Vitals:   06/29/16 1415  TempSrc:   PainSc: 0-No pain                 Kyri Shader,W. EDMOND

## 2016-06-29 NOTE — Progress Notes (Signed)
To O.R. via bed

## 2016-06-29 NOTE — Progress Notes (Signed)
   Subjective: Currently, the patient feels ok. Anxious about surgery, but ready.  Interval Events: Cleared by Psych yesterday.  Objective: Vital signs in last 24 hours: Vitals:   06/28/16 0445 06/28/16 1427 06/28/16 2153 06/29/16 0422  BP: 139/85 122/68 (!) 155/89 121/74  Pulse: 93 94 80 93  Resp: 18 18 18 18   Temp: 98.1 F (36.7 C) 99.8 F (37.7 C) 98.5 F (36.9 C) 98.5 F (36.9 C)  TempSrc: Oral Oral Oral Oral  SpO2: 99% 99% 100% 99%   Physical Exam: Physical Exam  Constitutional: He is oriented to person, place, and time. He appears well-developed and well-nourished. He is cooperative. No distress.  HENT:  Head: Normocephalic and atraumatic.  Right Ear: Hearing normal.  Left Ear: Hearing normal.  Nose: Nose normal.  Mouth/Throat: Mucous membranes are normal.  Cardiovascular: Normal rate, regular rhythm, S1 normal, S2 normal and intact distal pulses.  Exam reveals no gallop.   No murmur heard. Pulmonary/Chest: Effort normal and breath sounds normal. No respiratory distress. He has no wheezes. He has no rhonchi. He has no rales. He exhibits no tenderness.  Abdominal: Soft. Normal appearance and bowel sounds are normal. He exhibits no ascites. There is no hepatosplenomegaly. There is no tenderness.  Musculoskeletal:  Bilateral feet and ankles splinted. Able to moves toes. Sensation intact. No significant pain on exam.  Neurological: He is alert and oriented to person, place, and time. He has normal strength.  Skin: Skin is warm, dry and intact. He is not diaphoretic.  Psychiatric: His behavior is normal. His mood appears anxious. His speech is tangential (minimally).   Labs: CBC:  Recent Labs Lab 06/27/16 1837 06/27/16 2106  WBC 18.2*  --   NEUTROABS  --  12.7*  HGB 16.1  --   HCT 45.3  --   MCV 83.1  --   PLT 271  --    Metabolic Panel:  Recent Labs Lab 06/27/16 1837  NA 139  K 3.6  CL 103  CO2 22  GLUCOSE 77  BUN 12  CREATININE 1.22  CALCIUM 9.7      Medications: Scheduled Medications: . heparin  5,000 Units Subcutaneous Q8H  . OLANZapine zydis  10 mg Oral q morning - 10a  . oxyCODONE  5 mg Oral Once   PRN Medications: acetaminophen **OR** acetaminophen, HYDROcodone-acetaminophen  Assessment/Plan: [name] is a 36 y.o. male with schizophrenia who presents with bilateral calcaneal fractures after jumping off a balcony.  1) Schizophrenia: On Zyprexa 10mg , follows with [facility]. Psych c/s yesterday. Recommend continuing current therapy. - continue Zyprexa 10mg   2) Bilateral closed, comminuted calcaneal fx: Plan ORIF L foot today, R foot Fri. Splinted and elevated in bed. Strict non-wt bearing. Pain control NSAID/Percocet PRN. CSW assistance with disposition. - Appreciate Ortho Recs - pain control w/ NSAIDs/Oxy PRN - PT/OT - CSW  Length of Stay: 2 day(s) Dispo: Anticipated discharge after stabilization from 2nd surgery Friday, approx 5-6 days.  Carolynn CommentStrelow, [name], MD Pager: [phone] (7AM-5PM) 06/29/2016, 7:22 AM
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
## 2016-06-29 NOTE — Op Note (Signed)
Preop diagnosis: Left comminuted displaced closed calcaneus fracture  Postop diagnosis: Same  Procedure: ORIF left calcaneus fracture. Lateral calcaneal plate.  Surgeon: Annell GreeningMark Devonta Blanford M.D.  Asst.: Zonia KiefJames Owens PA-C medically necessary and present for the entire procedure  Anesthesia Gen. plus preoperative block.  Procedure after the standard prepping draping patient lateral position with axillary roll proximal thigh tourniquet DuraPrep externally sheets and drapes were applied sterile gloves over the toe timeout procedure Ancef prophylaxis. Incision was made after sterile skin marker midway between the Achilles tendon and peroneal tendons bun the fibula extended to 90 angle and then following the inferior aspect of the calcaneus. Sharp dissection directly down the cortex of the the calcaneus was performed with direct elevation coming off the periosteum sharply with a 15 blade. It was elevated anterior to the peroneal tendons. A Schanz pin was placed in the calcaneus to restore Bohler's angle. Small well wide Hohmann retractors are used push up the anterior portion and the elevate given up to match the medial sustentacular fragment. K wires used dependent holder. Checking intermittently with the sterilely draped C-arm. Once reduction was good position the large anatomic plate was selected adjusted checked under C-arm K wires placed a hold it and then filled with locking and nonlocking screws appropriately. Nonlocking screws used to suck the plate down flush with the bone . There was good correction of varus and restoration of Boehler's angle. Couple to holes were left empty not required for fixation. Gutters were too long no screws were in the sub-talar joint. Satisfactory position and alignment. Tourniquet was deflated after about the 40 minutes irrigated with saline solution and then the closure of the skin with interrupted 2-0 nylon sutures. Skin flap viability looked good. Xeroform 4 x 4's ABDs giant  cotton roll and Coban was applied.

## 2016-06-29 NOTE — Progress Notes (Addendum)
   Subjective:   Procedure(s) (LRB): OPEN REDUCTION INTERNAL FIXATION (ORIF) CALCANEOUS FRACTURE (Left) Patient reports pain as mild.    Objective: Vital signs in last 24 hours: Temp:  [98.5 F (36.9 C)-99.8 F (37.7 C)] 98.5 F (36.9 C) (05/16 0422) Pulse Rate:  [80-94] 93 (05/16 0422) Resp:  [18] 18 (05/16 0422) BP: (121-155)/(68-89) 121/74 (05/16 0422) SpO2:  [99 %-100 %] 99 % (05/16 0422)  Intake/Output from previous day: 05/15 0701 - 05/16 0700 In: 720 [P.O.:720] Out: 650 [Urine:650] Intake/Output this shift: No intake/output data recorded.   Recent Labs  06/27/16 1837  HGB 16.1    Recent Labs  06/27/16 1837  WBC 18.2*  RBC 5.45  HCT 45.3  PLT 271    Recent Labs  06/27/16 1837  NA 139  K 3.6  CL 103  CO2 22  BUN 12  CREATININE 1.22  GLUCOSE 77  CALCIUM 9.7   No results for input(s): LABPT, INR in the last 72 hours.  Neurologically intact No results found.  Assessment/Plan:   Procedure(s) (LRB): OPEN REDUCTION INTERNAL FIXATION (ORIF) CALCANEOUS FRACTURE (Left)  Plan:  Surgery today for ORIF left calcaneus fracture. IV fluid at 75cc/hr , NPO.   SCD's  Hold heparin Eldred MangesMark C Yates 06/29/2016, 7:36 AM

## 2016-06-29 NOTE — Interval H&P Note (Signed)
History and Physical Interval Note:  06/29/2016 2:03 PM  Bob Reyes  has presented today for surgery, with the diagnosis of Left Calcaneous fracture  The various methods of treatment have been discussed with the patient and family. After consideration of risks, benefits and other options for treatment, the patient has consented to  Procedure(s): OPEN REDUCTION INTERNAL FIXATION (ORIF) CALCANEOUS FRACTURE (Left) as a surgical intervention .  The patient's history has been reviewed, patient examined, no change in status, stable for surgery.  I have reviewed the patient's chart and labs.  Questions were answered to the patient's satisfaction.     Eldred MangesMark C Terriana Barreras

## 2016-06-29 NOTE — Anesthesia Procedure Notes (Signed)
Anesthesia Regional Block: Popliteal block   Pre-Anesthetic Checklist: ,, timeout performed, Correct Patient, Correct Site, Correct Laterality, Correct Procedure,, site marked, risks and benefits discussed, Surgical consent,  Pre-op evaluation,  At surgeon's request and post-op pain management  Laterality: Left  Prep: chloraprep       Needles:  Injection technique: Single-shot  Needle Type: Echogenic Stimulator Needle     Needle Length: 9cm  Needle Gauge: 21     Additional Needles:   Procedures: ultrasound guided,,,,,,,,   Nerve Stimulator or Paresthesia:  Response: Quadriceps muscle contraction, 0.45 mA,   Additional Responses:   Narrative:  Start time: 06/29/2016 2:15 PM End time: 06/29/2016 2:25 PM Injection made incrementally with aspirations every 5 mL.  Performed by: Personally  Anesthesiologist: Karna ChristmasELLENDER, RYAN P  Additional Notes: Functioning IV was confirmed and monitors were applied.  A 90mm 21ga Arrow echogenic stimulator needle was used. Sterile prep,hand hygiene and sterile gloves were used.  Negative aspiration and negative test dose prior to incremental administration of local anesthetic. The patient tolerated the procedure well.

## 2016-06-29 NOTE — Transfer of Care (Signed)
Immediate Anesthesia Transfer of Care Note  Patient: Colin BentonJosh M Picchi  Procedure(s) Performed: Procedure(s): OPEN REDUCTION INTERNAL FIXATION (ORIF) CALCANEOUS FRACTURE (Left)  Patient Location: PACU  Anesthesia Type:GA combined with regional for post-op pain  Level of Consciousness: awake, oriented and patient cooperative  Airway & Oxygen Therapy: Patient Spontanous Breathing and Patient connected to nasal cannula oxygen  Post-op Assessment: Report given to RN, Post -op Vital signs reviewed and stable and Patient moving all extremities  Post vital signs: Reviewed and stable  Last Vitals:  Vitals:   06/29/16 1415 06/29/16 1641  BP: (!) 147/102   Pulse: (!) 125 (!) 112  Resp: (!) 24 18  Temp:  37.1 C    Last Pain:  Vitals:   06/29/16 1415  TempSrc:   PainSc: 0-No pain      Patients Stated Pain Goal: 2 (06/28/16 1755)  Complications: No apparent anesthesia complications

## 2016-06-29 NOTE — Anesthesia Preprocedure Evaluation (Addendum)
Anesthesia Evaluation  Patient identified by MRN, date of birth, ID band Patient awake    Reviewed: Allergy & Precautions, NPO status , Patient's Chart, lab work & pertinent test results  Airway Mallampati: II  TM Distance: >3 FB Neck ROM: Full    Dental no notable dental hx.    Pulmonary neg pulmonary ROS,    Pulmonary exam normal breath sounds clear to auscultation       Cardiovascular negative cardio ROS Normal cardiovascular exam Rhythm:Regular Rate:Normal     Neuro/Psych Schizophrenia negative neurological ROS     GI/Hepatic negative GI ROS, Neg liver ROS,   Endo/Other  negative endocrine ROS  Renal/GU negative Renal ROS  negative genitourinary   Musculoskeletal negative musculoskeletal ROS (+)   Abdominal   Peds negative pediatric ROS (+)  Hematology negative hematology ROS (+)   Anesthesia Other Findings   Reproductive/Obstetrics negative OB ROS                             Anesthesia Physical Anesthesia Plan  ASA: II  Anesthesia Plan: General and Regional   Post-op Pain Management: GA combined w/ Regional for post-op pain   Induction: Intravenous  Airway Management Planned: Oral ETT  Additional Equipment:   Intra-op Plan:   Post-operative Plan: Extubation in OR  Informed Consent: I have reviewed the patients History and Physical, chart, labs and discussed the procedure including the risks, benefits and alternatives for the proposed anesthesia with the patient or authorized representative who has indicated his/her understanding and acceptance.   Dental advisory given  Plan Discussed with: CRNA and Surgeon  Anesthesia Plan Comments:         Anesthesia Quick Evaluation

## 2016-06-29 NOTE — Brief Op Note (Signed)
06/27/2016 - 06/29/2016  4:52 PM  PATIENT:  Colin BentonJosh M Delorey  36 y.o. male  PRE-OPERATIVE DIAGNOSIS:  Left Calcaneous fracture  POST-OPERATIVE DIAGNOSIS:  Left Calcaneous fracture  PROCEDURE:  Procedure(s): OPEN REDUCTION INTERNAL FIXATION (ORIF) CALCANEOUS FRACTURE (Left)  SURGEON:  Surgeon(s) and Role:    * Eldred MangesYates, Mark C, MD - Primary  PHYSICIAN ASSISTANT: Zonia Kiefjames Joylene Wescott     ANESTHESIA:   general  EBL:  No intake/output data recorded.  BLOOD ADMINISTERED:none  DRAINS: none   LOCAL MEDICATIONS USED:  NONE  SPECIMEN:  No Specimen  DISPOSITION OF SPECIMEN:  N/A  COUNTS:  YES  TOURNIQUET:   Total Tourniquet Time Documented: Thigh (Left) - 52 minutes Total: Thigh (Left) - 52 minutes   PATIENT DISPOSITION:  PACU - hemodynamically stable.

## 2016-06-29 NOTE — Anesthesia Procedure Notes (Signed)
Procedure Name: Intubation Date/Time: 06/29/2016 2:56 PM Performed by: Melina Copa, Raychelle Hudman R Pre-anesthesia Checklist: Patient identified, Emergency Drugs available, Suction available and Patient being monitored Patient Re-evaluated:Patient Re-evaluated prior to inductionOxygen Delivery Method: Circle System Utilized Preoxygenation: Pre-oxygenation with 100% oxygen Intubation Type: IV induction Ventilation: Mask ventilation without difficulty Laryngoscope Size: Mac and 4 Grade View: Grade I Tube type: Oral Tube size: 8.0 mm Number of attempts: 1 Airway Equipment and Method: Stylet Placement Confirmation: ETT inserted through vocal cords under direct vision,  positive ETCO2 and breath sounds checked- equal and bilateral Secured at: 22 cm Tube secured with: Tape Dental Injury: Teeth and Oropharynx as per pre-operative assessment

## 2016-06-30 ENCOUNTER — Encounter (HOSPITAL_COMMUNITY): Payer: Self-pay | Admitting: Orthopaedic Surgery

## 2016-06-30 ENCOUNTER — Other Ambulatory Visit (INDEPENDENT_AMBULATORY_CARE_PROVIDER_SITE_OTHER): Payer: Self-pay | Admitting: Orthopaedic Surgery

## 2016-06-30 LAB — BASIC METABOLIC PANEL
ANION GAP: 12 (ref 5–15)
BUN: 11 mg/dL (ref 6–20)
CHLORIDE: 98 mmol/L — AB (ref 101–111)
CO2: 26 mmol/L (ref 22–32)
Calcium: 9 mg/dL (ref 8.9–10.3)
Creatinine, Ser: 1.14 mg/dL (ref 0.61–1.24)
GFR calc Af Amer: >60 mL/min (ref >60)
GLUCOSE: 119 mg/dL — AB (ref 65–99)
POTASSIUM: 3.5 mmol/L (ref 3.5–5.1)
Sodium: 136 mmol/L (ref 135–145)

## 2016-06-30 LAB — PROLACTIN: PROLACTIN: 23.5 ng/mL — AB (ref 4.0–15.2)

## 2016-06-30 MED ORDER — CEFAZOLIN SODIUM-DEXTROSE 2-4 GM/100ML-% IV SOLN
2.0000 g | Freq: Once | INTRAVENOUS | Status: AC
  Administered 2016-07-01: 2 g via INTRAVENOUS
  Filled 2016-06-30: qty 100

## 2016-06-30 NOTE — Progress Notes (Signed)
   Subjective: 1 Day Post-Op Procedure(s) (LRB): OPEN REDUCTION INTERNAL FIXATION (ORIF) CALCANEOUS FRACTURE (Left) Patient reports pain as mild.    Objective: Vital signs in last 24 hours: Temp:  [98.1 F (36.7 C)-99.8 F (37.7 C)] 99.1 F (37.3 C) (05/17 0300) Pulse Rate:  [92-125] 105 (05/17 0300) Resp:  [17-25] 18 (05/17 0300) BP: (119-158)/(73-104) 147/88 (05/17 0300) SpO2:  [96 %-100 %] 98 % (05/17 0300) Weight:  [225 lb (102.1 kg)] 225 lb (102.1 kg) (05/16 1341)  Intake/Output from previous day: 05/16 0701 - 05/17 0700 In: 462 [P.O.:240; I.V.:172; IV Piggyback:50] Out: 400 [Urine:400] Intake/Output this shift: No intake/output data recorded.   Recent Labs  06/27/16 1837  HGB 16.1    Recent Labs  06/27/16 1837  WBC 18.2*  RBC 5.45  HCT 45.3  PLT 271    Recent Labs  06/27/16 1837 06/30/16 0546  NA 139 136  K 3.6 3.5  CL 103 98*  CO2 22 26  BUN 12 11  CREATININE 1.22 1.14  GLUCOSE 77 119*  CALCIUM 9.7 9.0   No results for input(s): LABPT, INR in the last 72 hours.  Neurologically intact Dg Os Calcis Left  Result Date: 06/29/2016 CLINICAL DATA:  ORIF left calcaneus. EXAM: LEFT OS CALCIS - 2+ VIEW COMPARISON:  CT 06/27/2016.  Left foot 06/27/2016. FINDINGS: Plate and screw fixation of left calcaneal fracture with near anatomic alignment. Hardware intact. Two images obtained. 0 minutes 43 seconds fluoroscopy time . IMPRESSION: ORIF left calcaneal fracture. Electronically Signed   By: Maisie Fushomas  Register   On: 06/29/2016 16:28   Dg C-arm 1-60 Min  Result Date: 06/29/2016 CLINICAL DATA:  ORIF left calcaneal fracture. EXAM: DG C-ARM 61-120 MIN COMPARISON:  CT 06/27/2016.  Left foot 06/27/2016 FINDINGS: ORIF left calcaneal fracture. Hardware intact. Near anatomic alignment. Two images obtained. 0 minutes 43 seconds fluoroscopy time . IMPRESSION: ORIF left calcaneal fracture. Electronically Signed   By: Maisie Fushomas  Register   On: 06/29/2016 16:23     Assessment/Plan: 1 Day Post-Op Procedure(s) (LRB): OPEN REDUCTION INTERNAL FIXATION (ORIF) CALCANEOUS FRACTURE (Left) Plan:  Surgery on right calcaneus on Friday AM.   NPO after MN  Eldred MangesMark C Yates 06/30/2016, 7:36 AM

## 2016-06-30 NOTE — Progress Notes (Addendum)
   Subjective: Currently, the patient is doing well POD 1 of 1/2 ORIF procedures. No pain. No other complaints. Had BM this AM. Eating well.  Objective: Vital signs in last 24 hours: Vitals:   06/29/16 1700 06/29/16 2014 06/30/16 0013 06/30/16 0300  BP: 139/89 119/85 134/74 (!) 147/88  Pulse: 92 (!) 110 94 (!) 105  Resp: 17 18 18 18   Temp: 98.1 F (36.7 C) 99.2 F (37.3 C) 98.2 F (36.8 C) 99.1 F (37.3 C)  TempSrc:  Oral Oral Oral  SpO2: 100% 96% 98% 98%  Weight:      Height:       Physical Exam: Physical Exam  Constitutional: He is oriented to person, place, and time. He appears well-developed and well-nourished. He is cooperative. No distress.  HENT:  Head: Normocephalic and atraumatic.  Right Ear: Hearing normal.  Left Ear: Hearing normal.  Nose: Nose normal.  Mouth/Throat: Mucous membranes are normal.  Cardiovascular: Normal rate, regular rhythm, S1 normal, S2 normal and intact distal pulses.  Exam reveals no gallop.   No murmur heard. Pulmonary/Chest: Effort normal and breath sounds normal. No respiratory distress. He has no wheezes. He has no rhonchi. He has no rales. He exhibits no tenderness.  Abdominal: Soft. Normal appearance and bowel sounds are normal. He exhibits no ascites. There is no hepatosplenomegaly. There is no tenderness.  Musculoskeletal:  R foot in splint and wrap. L ankle in sterile dressing from OR.  Neurological: He is alert and oriented to person, place, and time. He has normal strength.  Skin: Skin is warm, dry and intact. He is not diaphoretic.  Psychiatric: His behavior is normal.   Labs: CBC:  Recent Labs Lab 06/27/16 1837 06/27/16 2106  WBC 18.2*  --   NEUTROABS  --  12.7*  HGB 16.1  --   HCT 45.3  --   MCV 83.1  --   PLT 271  --    Metabolic Panel:  Recent Labs Lab 06/27/16 1837 06/29/16 0654 06/30/16 0546  NA 139  --  136  K 3.6  --  3.5  CL 103  --  98*  CO2 22  --  26  GLUCOSE 77  --  119*  BUN 12  --  11    CREATININE 1.22  --  1.14  CALCIUM 9.7  --  9.0  ALT  --  31  --   ALKPHOS  --  70  --   BILITOT  --  1.7*  --   PROT  --  7.0  --   ALBUMIN  --  4.0  --     Medications: Scheduled Medications: . aspirin  325 mg Oral Daily  . docusate  100 mg Oral BID  . OLANZapine zydis  10 mg Oral q morning - 10a   PRN Medications: acetaminophen **OR** acetaminophen, HYDROmorphone (DILAUDID) injection, metoCLOPramide **OR** metoCLOPramide (REGLAN) injection, oxyCODONE, polyethylene glycol  Assessment/Plan: Mr. Bob GladdenJosh M Reyes is a 36 y.o. male with schizophrenia who presents with bilateral calcaneal fractures after jumping off a balcony.  1) Schizophrenia: Stable. - continue Zyprexa 10mg   2) Bilateral closed, comminuted calcaneal fx: s/p ORIF, plan R foot Fri. Appreciate Ortho management. - pain control post-op per Ortho - PT/OT - CSW for Dispo  Length of Stay: 3 day(s) Dispo: Anticipated discharge after stabilization surgery Friday, approx 3-4 days.  Carolynn CommentStrelow, Quinterius Gaida, MD Pager: [phone] (7AM-5PM) 06/30/2016, 11:20 AM

## 2016-06-30 NOTE — NC FL2 (Signed)
[place] MEDICAID FL2 LEVEL OF CARE SCREENING TOOL     IDENTIFICATION  Patient Name: Bob Reyes Birthdate: [birth year] Sex: male Admission Date (Current Location): 06/27/2016  [facility] [place]   Facility and Address:  [facility], [address], [place], [place] [address]      Provider Number: [id number]  Attending Physician Name and Address:  Burns SpainButcher, Anniston Nellums A, MD  Relative Name and Phone Number:       Current Level of Care: Hospital Recommended Level of Care: Skilled Nursing Facility Prior Approval Number:    Date Approved/Denied:   PASRR Number:    Discharge Plan: SNF    Current Diagnoses: Patient Active Problem List   Diagnosis Date Noted  . Closed fracture of bone of left foot   . Paranoid schizophrenia, chronic condition (HCC) 06/28/2016  . Bilateral calcaneal fractures 06/27/2016    Orientation RESPIRATION BLADDER Height & Weight     Self, Time, Situation, Place  Normal Continent Weight: 225 lb (102.1 kg) Height:  6\' 5"  (195.6 cm)  BEHAVIORAL SYMPTOMS/MOOD NEUROLOGICAL BOWEL NUTRITION STATUS      Continent    AMBULATORY STATUS COMMUNICATION OF NEEDS Skin   Limited Assist Verbally Surgical wounds                       Personal Care Assistance Level of Assistance  Bathing, Dressing Bathing Assistance: Limited assistance   Dressing Assistance: Limited assistance     Functional Limitations Info             SPECIAL CARE FACTORS FREQUENCY  PT (By licensed PT), OT (By licensed OT)     PT Frequency: 5x/wk OT Frequency: 5x/wk            Contractures      Additional Factors Info  Code Status, Allergies, Psychotropic Code Status Info: full Allergies Info: nka Psychotropic Info: Zyprexa 10 mg         Current Medications (06/30/2016):  This is the current hospital active medication list Current Facility-Administered Medications  Medication Dose Route Frequency Provider Last Rate  Last Dose  . 0.45 % sodium chloride infusion   Intravenous Continuous Eldred MangesYates, Mark C, MD      . acetaminophen (TYLENOL) tablet 650 mg  650 mg Oral Q6H PRN Naida Sleightwens, James M, PA-C       Or  . acetaminophen (TYLENOL) suppository 650 mg  650 mg Rectal Q6H PRN Naida Sleightwens, James M, PA-C      . aspirin tablet 325 mg  325 mg Oral Daily Zonia KiefOwens, James M, PA-C   325 mg at 06/30/16 1135  . docusate (COLACE) 50 MG/5ML liquid 100 mg  100 mg Oral BID Burns SpainButcher, Babara Buffalo A, MD   100 mg at 06/30/16 1135  . HYDROmorphone (DILAUDID) injection 0.5 mg  0.5 mg Intravenous Q3H PRN Zonia Kiefwens, James M, PA-C      . lactated ringers infusion   Intravenous Continuous Ellender, Catheryn Baconyan P, MD 10 mL/hr at 06/29/16 1342    . metoCLOPramide (REGLAN) tablet 5-10 mg  5-10 mg Oral Q8H PRN Naida Sleightwens, James M, PA-C       Or  . metoCLOPramide (REGLAN) injection 5-10 mg  5-10 mg Intravenous Q8H PRN Zonia Kiefwens, James M, PA-C      . OLANZapine zydis (ZYPREXA) disintegrating tablet 10 mg  10 mg Oral q morning - 10a John Giovanniathore, Vasundhra, MD   10 mg at 06/30/16 1136  . oxyCODONE (Oxy IR/ROXICODONE) immediate release tablet  5 mg  5 mg Oral Q4H PRN Naida Sleight, PA-C      . polyethylene glycol (MIRALAX / GLYCOLAX) packet 17 g  17 g Oral Daily PRN Naida Sleight, PA-C         Discharge Medications: Please see discharge summary for a list of discharge medications.  Relevant Imaging Results:  Relevant Lab Results:   Additional Information SS#: [id number]  Baldemar Lenis, LCSW

## 2016-07-01 ENCOUNTER — Encounter (HOSPITAL_COMMUNITY)
Admission: EM | Disposition: A | Discharge status: Skilled Nursing Facility | Payer: Self-pay | Source: Home / Self Care | Attending: Internal Medicine

## 2016-07-01 ENCOUNTER — Inpatient Hospital Stay (HOSPITAL_COMMUNITY): Payer: Medicaid Other | Admitting: Anesthesiology

## 2016-07-01 ENCOUNTER — Inpatient Hospital Stay (HOSPITAL_COMMUNITY): Payer: Medicaid Other

## 2016-07-01 ENCOUNTER — Encounter (HOSPITAL_COMMUNITY): Payer: Self-pay | Admitting: Anesthesiology

## 2016-07-01 DIAGNOSIS — S92901A Unspecified fracture of right foot, initial encounter for closed fracture: Secondary | ICD-10-CM

## 2016-07-01 DIAGNOSIS — S92002D Unspecified fracture of left calcaneus, subsequent encounter for fracture with routine healing: Secondary | ICD-10-CM

## 2016-07-01 DIAGNOSIS — F203 Undifferentiated schizophrenia: Secondary | ICD-10-CM

## 2016-07-01 DIAGNOSIS — R030 Elevated blood-pressure reading, without diagnosis of hypertension: Secondary | ICD-10-CM

## 2016-07-01 DIAGNOSIS — S92001D Unspecified fracture of right calcaneus, subsequent encounter for fracture with routine healing: Secondary | ICD-10-CM

## 2016-07-01 DIAGNOSIS — R5082 Postprocedural fever: Secondary | ICD-10-CM

## 2016-07-01 DIAGNOSIS — R000 Tachycardia, unspecified: Secondary | ICD-10-CM

## 2016-07-01 DIAGNOSIS — W1789XD Other fall from one level to another, subsequent encounter: Secondary | ICD-10-CM

## 2016-07-01 HISTORY — PX: ORIF CALCANEOUS FRACTURE: SHX5030

## 2016-07-01 LAB — BASIC METABOLIC PANEL
Anion gap: 9 (ref 5–15)
BUN: 15 mg/dL (ref 6–20)
CALCIUM: 8.6 mg/dL — AB (ref 8.9–10.3)
CHLORIDE: 102 mmol/L (ref 101–111)
CO2: 26 mmol/L (ref 22–32)
CREATININE: 1.09 mg/dL (ref 0.61–1.24)
Glucose, Bld: 110 mg/dL — ABNORMAL HIGH (ref 65–99)
Potassium: 3.4 mmol/L — ABNORMAL LOW (ref 3.5–5.1)
SODIUM: 137 mmol/L (ref 135–145)

## 2016-07-01 SURGERY — OPEN REDUCTION INTERNAL FIXATION (ORIF) CALCANEOUS FRACTURE
Anesthesia: Regional | Laterality: Right

## 2016-07-01 MED ORDER — MIDAZOLAM HCL 2 MG/2ML IJ SOLN
2.0000 mg | Freq: Once | INTRAMUSCULAR | Status: AC
  Administered 2016-07-01: 2 mg via INTRAVENOUS

## 2016-07-01 MED ORDER — LIDOCAINE HCL (CARDIAC) 20 MG/ML IV SOLN
INTRAVENOUS | Status: DC | PRN
  Administered 2016-07-01: 50 mg via INTRAVENOUS

## 2016-07-01 MED ORDER — CEFAZOLIN SODIUM-DEXTROSE 2-3 GM-% IV SOLR
INTRAVENOUS | Status: DC | PRN
  Administered 2016-07-01: 2 g via INTRAVENOUS

## 2016-07-01 MED ORDER — MIDAZOLAM HCL 2 MG/2ML IJ SOLN
INTRAMUSCULAR | Status: AC
  Filled 2016-07-01: qty 2

## 2016-07-01 MED ORDER — SUGAMMADEX SODIUM 200 MG/2ML IV SOLN
INTRAVENOUS | Status: DC | PRN
  Administered 2016-07-01: 200 mg via INTRAVENOUS

## 2016-07-01 MED ORDER — ONDANSETRON HCL 4 MG/2ML IJ SOLN
INTRAMUSCULAR | Status: DC | PRN
  Administered 2016-07-01: 4 mg via INTRAVENOUS

## 2016-07-01 MED ORDER — CEFAZOLIN SODIUM-DEXTROSE 1-4 GM/50ML-% IV SOLN
1.0000 g | Freq: Three times a day (TID) | INTRAVENOUS | Status: AC
  Administered 2016-07-01 (×2): 1 g via INTRAVENOUS
  Filled 2016-07-01 (×2): qty 50

## 2016-07-01 MED ORDER — FENTANYL CITRATE (PF) 100 MCG/2ML IJ SOLN
INTRAMUSCULAR | Status: AC
  Administered 2016-07-01: 50 ug via INTRAVENOUS
  Filled 2016-07-01: qty 2

## 2016-07-01 MED ORDER — ROCURONIUM BROMIDE 10 MG/ML (PF) SYRINGE
PREFILLED_SYRINGE | INTRAVENOUS | Status: AC
Start: 2016-07-01 — End: 2016-07-01
  Filled 2016-07-01: qty 5

## 2016-07-01 MED ORDER — DEXAMETHASONE SODIUM PHOSPHATE 10 MG/ML IJ SOLN
INTRAMUSCULAR | Status: DC | PRN
  Administered 2016-07-01: 10 mg via INTRAVENOUS

## 2016-07-01 MED ORDER — FENTANYL CITRATE (PF) 100 MCG/2ML IJ SOLN
50.0000 ug | Freq: Once | INTRAMUSCULAR | Status: AC
  Administered 2016-07-01: 50 ug via INTRAVENOUS

## 2016-07-01 MED ORDER — CHLORHEXIDINE GLUCONATE CLOTH 2 % EX PADS
6.0000 | MEDICATED_PAD | Freq: Every day | CUTANEOUS | Status: DC
  Administered 2016-07-02 – 2016-07-04 (×3): 6 via TOPICAL

## 2016-07-01 MED ORDER — FENTANYL CITRATE (PF) 250 MCG/5ML IJ SOLN
INTRAMUSCULAR | Status: AC
  Filled 2016-07-01: qty 5

## 2016-07-01 MED ORDER — MIDAZOLAM HCL 2 MG/2ML IJ SOLN
INTRAMUSCULAR | Status: AC
  Administered 2016-07-01: 2 mg via INTRAVENOUS
  Filled 2016-07-01: qty 2

## 2016-07-01 MED ORDER — FENTANYL CITRATE (PF) 100 MCG/2ML IJ SOLN
INTRAMUSCULAR | Status: DC | PRN
  Administered 2016-07-01: 50 ug via INTRAVENOUS

## 2016-07-01 MED ORDER — OXYCODONE HCL 5 MG/5ML PO SOLN: 5.0000 mg | Freq: Once | ORAL | Status: DC | PRN

## 2016-07-01 MED ORDER — ONDANSETRON HCL 4 MG/2ML IJ SOLN
INTRAMUSCULAR | Status: AC
  Filled 2016-07-01: qty 2

## 2016-07-01 MED ORDER — HYDROMORPHONE HCL 1 MG/ML IJ SOLN: 0.2500 mg | INTRAMUSCULAR | Status: DC | PRN

## 2016-07-01 MED ORDER — PROMETHAZINE HCL 25 MG/ML IJ SOLN: 6.2500 mg | INTRAMUSCULAR | Status: DC | PRN

## 2016-07-01 MED ORDER — OXYCODONE HCL 5 MG PO TABS
5.0000 mg | ORAL_TABLET | Freq: Once | ORAL | Status: DC | PRN
  Filled 2016-07-01 (×2): qty 1

## 2016-07-01 MED ORDER — PROPOFOL 10 MG/ML IV BOLUS
INTRAVENOUS | Status: DC | PRN
  Administered 2016-07-01: 200 mg via INTRAVENOUS
  Administered 2016-07-01: 50 mg via INTRAVENOUS

## 2016-07-01 MED ORDER — CEFAZOLIN SODIUM-DEXTROSE 2-4 GM/100ML-% IV SOLN
INTRAVENOUS | Status: AC
  Filled 2016-07-01: qty 100

## 2016-07-01 MED ORDER — DEXAMETHASONE SODIUM PHOSPHATE 10 MG/ML IJ SOLN
INTRAMUSCULAR | Status: AC
  Filled 2016-07-01: qty 1

## 2016-07-01 MED ORDER — 0.9 % SODIUM CHLORIDE (POUR BTL) OPTIME
TOPICAL | Status: DC | PRN
  Administered 2016-07-01: 1000 mL

## 2016-07-01 MED ORDER — ROPIVACAINE HCL 5 MG/ML IJ SOLN
INTRAMUSCULAR | Status: DC | PRN
  Administered 2016-07-01: 30 mL via PERINEURAL

## 2016-07-01 MED ORDER — PROPOFOL 10 MG/ML IV BOLUS
INTRAVENOUS | Status: AC
  Filled 2016-07-01: qty 40

## 2016-07-01 MED ORDER — SUGAMMADEX SODIUM 200 MG/2ML IV SOLN
INTRAVENOUS | Status: AC
  Filled 2016-07-01: qty 2

## 2016-07-01 MED ORDER — LACTATED RINGERS IV SOLN
INTRAVENOUS | Status: DC
  Administered 2016-07-01: 10:00:00 via INTRAVENOUS

## 2016-07-01 MED ORDER — ONDANSETRON HCL 4 MG/2ML IJ SOLN: 4.0000 mg | Freq: Four times a day (QID) | INTRAMUSCULAR | Status: DC | PRN

## 2016-07-01 MED ORDER — ROCURONIUM BROMIDE 100 MG/10ML IV SOLN
INTRAVENOUS | Status: DC | PRN
  Administered 2016-07-01: 10 mg via INTRAVENOUS
  Administered 2016-07-01: 50 mg via INTRAVENOUS

## 2016-07-01 MED ORDER — BUPIVACAINE HCL (PF) 0.25 % IJ SOLN
INTRAMUSCULAR | Status: AC
  Filled 2016-07-01: qty 30

## 2016-07-01 MED ORDER — LIDOCAINE 2% (20 MG/ML) 5 ML SYRINGE
INTRAMUSCULAR | Status: AC
  Filled 2016-07-01: qty 5

## 2016-07-01 MED ORDER — SODIUM CHLORIDE 0.45 % IV SOLN
INTRAVENOUS | Status: DC
  Administered 2016-07-01 – 2016-07-04 (×3): via INTRAVENOUS
  Filled 2016-07-01 (×8): qty 1000

## 2016-07-01 MED ORDER — MUPIROCIN 2 % EX OINT
1.0000 "application " | TOPICAL_OINTMENT | Freq: Two times a day (BID) | CUTANEOUS | Status: DC
  Administered 2016-07-01 – 2016-07-04 (×6): 1 via NASAL
  Filled 2016-07-01: qty 22

## 2016-07-01 MED ORDER — ONDANSETRON HCL 4 MG PO TABS: 4.0000 mg | ORAL_TABLET | Freq: Four times a day (QID) | ORAL | Status: DC | PRN

## 2016-07-01 SURGICAL SUPPLY — 77 items
BANDAGE ACE 4X5 VEL STRL LF (GAUZE/BANDAGES/DRESSINGS) IMPLANT
BANDAGE ACE 6X5 VEL STRL LF (GAUZE/BANDAGES/DRESSINGS) IMPLANT
BANDAGE ESMARK 6X9 LF (GAUZE/BANDAGES/DRESSINGS) IMPLANT
BIT DRILL 2.5X2.75 QC CALB (BIT) ×2 IMPLANT
BIT DRILL CALIBRATED 2.7 (BIT) ×1 IMPLANT
BIT DRILL CALIBRATED 2.7MM (BIT) ×1
BNDG CMPR 9X6 STRL LF SNTH (GAUZE/BANDAGES/DRESSINGS) ×1
BNDG COHESIVE 6X5 TAN STRL LF (GAUZE/BANDAGES/DRESSINGS) ×2 IMPLANT
BNDG ESMARK 6X9 LF (GAUZE/BANDAGES/DRESSINGS) ×3
COTTON STERILE ROLL (GAUZE/BANDAGES/DRESSINGS) ×2 IMPLANT
COVER MAYO STAND STRL (DRAPES) ×3 IMPLANT
COVER SURGICAL LIGHT HANDLE (MISCELLANEOUS) ×3 IMPLANT
CUFF TOURNIQUET SINGLE 34IN LL (TOURNIQUET CUFF) ×2 IMPLANT
CUFF TOURNIQUET SINGLE 44IN (TOURNIQUET CUFF) IMPLANT
DRAPE C-ARM 42X72 X-RAY (DRAPES) ×2 IMPLANT
DRAPE C-ARMOR (DRAPES) ×2 IMPLANT
DRAPE HALF SHEET 40X57 (DRAPES) ×3 IMPLANT
DRAPE INCISE IOBAN 66X45 STRL (DRAPES) ×3 IMPLANT
DRAPE U-SHAPE 47X51 STRL (DRAPES) ×3 IMPLANT
DRSG PAD ABDOMINAL 8X10 ST (GAUZE/BANDAGES/DRESSINGS) ×3 IMPLANT
DURAPREP 26ML APPLICATOR (WOUND CARE) ×3 IMPLANT
ELECT REM PT RETURN 9FT ADLT (ELECTROSURGICAL) ×3
ELECTRODE REM PT RTRN 9FT ADLT (ELECTROSURGICAL) ×1 IMPLANT
GAUZE SPONGE 4X4 12PLY STRL (GAUZE/BANDAGES/DRESSINGS) ×3 IMPLANT
GAUZE SPONGE 4X4 12PLY STRL LF (GAUZE/BANDAGES/DRESSINGS) ×2 IMPLANT
GAUZE XEROFORM 5X9 LF (GAUZE/BANDAGES/DRESSINGS) ×3 IMPLANT
GLOVE BIOGEL PI IND STRL 8 (GLOVE) ×2 IMPLANT
GLOVE BIOGEL PI INDICATOR 8 (GLOVE) ×4
GLOVE ORTHO TXT STRL SZ7.5 (GLOVE) ×6 IMPLANT
GOWN STRL REUS W/ TWL LRG LVL3 (GOWN DISPOSABLE) ×1 IMPLANT
GOWN STRL REUS W/ TWL XL LVL3 (GOWN DISPOSABLE) ×1 IMPLANT
GOWN STRL REUS W/TWL 2XL LVL3 (GOWN DISPOSABLE) ×3 IMPLANT
GOWN STRL REUS W/TWL LRG LVL3 (GOWN DISPOSABLE) ×3
GOWN STRL REUS W/TWL XL LVL3 (GOWN DISPOSABLE) ×3
K-WIRE ACE 1.6X6 (WIRE) ×6
KIT BASIN OR (CUSTOM PROCEDURE TRAY) ×3 IMPLANT
KIT ROOM TURNOVER OR (KITS) ×3 IMPLANT
KWIRE ACE 1.6X6 (WIRE) IMPLANT
MANIFOLD NEPTUNE II (INSTRUMENTS) ×3 IMPLANT
NS IRRIG 1000ML POUR BTL (IV SOLUTION) ×3 IMPLANT
PACK ORTHO EXTREMITY (CUSTOM PROCEDURE TRAY) ×3 IMPLANT
PAD ARMBOARD 7.5X6 YLW CONV (MISCELLANEOUS) ×6 IMPLANT
PAD CAST 4YDX4 CTTN HI CHSV (CAST SUPPLIES) ×1 IMPLANT
PADDING CAST COTTON 4X4 STRL (CAST SUPPLIES) ×3
PADDING CAST COTTON 6X4 STRL (CAST SUPPLIES) ×3 IMPLANT
PIN 4.5MM CANC THREADED AO (PIN) ×2 IMPLANT
PLATE LOCK LG RT CALC FOOT (Plate) ×2 IMPLANT
PUTTY DBM STAGRAFT PLUS 10CC (Putty) ×2 IMPLANT
SCREW CORT 3.5X32 (Screw) ×3 IMPLANT
SCREW CORT 3.5X40 (Screw) ×6 IMPLANT
SCREW CORT T15 24X3.5XST LCK (Screw) IMPLANT
SCREW CORT T15 28X3.5XST LCK (Screw) IMPLANT
SCREW CORT T15 30X3.5XST LCK (Screw) IMPLANT
SCREW CORT T15 32X3.5XST LCK (Screw) IMPLANT
SCREW CORTICAL 3.5X24MM (Screw) ×3 IMPLANT
SCREW CORTICAL 3.5X28MM (Screw) ×3 IMPLANT
SCREW CORTICAL 3.5X30MM (Screw) ×3 IMPLANT
SCREW LOCK CORT STAR 3.5X24 (Screw) ×2 IMPLANT
SCREW LOCK CORT STAR 3.5X26 (Screw) ×2 IMPLANT
SCREW LOCK CORT STAR 3.5X28 (Screw) ×4 IMPLANT
SCREW LOCK CORT STAR 3.5X40 (Screw) ×2 IMPLANT
SPONGE LAP 18X18 X RAY DECT (DISPOSABLE) ×3 IMPLANT
STAPLER VISISTAT 35W (STAPLE) IMPLANT
SUCTION FRAZIER HANDLE 10FR (MISCELLANEOUS) ×4
SUCTION TUBE FRAZIER 10FR DISP (MISCELLANEOUS) ×1 IMPLANT
SUT ETHILON 2 0 PSLX (SUTURE) ×4 IMPLANT
SUT ETHILON 3 0 PS 1 (SUTURE) ×6 IMPLANT
SUT VIC AB 2-0 CT1 27 (SUTURE) ×6
SUT VIC AB 2-0 CT1 TAPERPNT 27 (SUTURE) ×2 IMPLANT
TOWEL OR 17X24 6PK STRL BLUE (TOWEL DISPOSABLE) ×3 IMPLANT
TOWEL OR 17X26 10 PK STRL BLUE (TOWEL DISPOSABLE) ×3 IMPLANT
TUBE CONNECTING 12'X1/4 (SUCTIONS) ×1
TUBE CONNECTING 12X1/4 (SUCTIONS) ×2 IMPLANT
TUBE CONNECTING 20'X1/4 (TUBING) ×1
TUBE CONNECTING 20X1/4 (TUBING) ×1 IMPLANT
WATER STERILE IRR 1000ML POUR (IV SOLUTION) ×3 IMPLANT
YANKAUER SUCT BULB TIP NO VENT (SUCTIONS) ×5 IMPLANT

## 2016-07-01 NOTE — Anesthesia Postprocedure Evaluation (Signed)
Anesthesia Post Note  Patient: Bob Reyes  Procedure(s) Performed: Procedure(s) (LRB): OPEN REDUCTION INTERNAL FIXATION (ORIF) RIGHT CALCANEOUS FRACTURE (Right)  Patient location during evaluation: PACU Anesthesia Type: Regional and General Level of consciousness: awake and alert Pain management: pain level controlled Vital Signs Assessment: post-procedure vital signs reviewed and stable Respiratory status: spontaneous breathing, nonlabored ventilation, respiratory function stable and patient connected to nasal cannula oxygen Cardiovascular status: blood pressure returned to baseline and stable Postop Assessment: no signs of nausea or vomiting Anesthetic complications: no       Last Vitals:  Vitals:   07/01/16 1239 07/01/16 1447  BP: (!) 141/84 (!) 143/87  Pulse: 98 (!) 120  Resp: 18 18  Temp:  37.8 C    Last Pain:  Vitals:   07/01/16 1447  TempSrc: Axillary  PainSc:                  Catheryn Baconyan P Ellender

## 2016-07-01 NOTE — Anesthesia Procedure Notes (Signed)
Procedure Name: Intubation Date/Time: 07/01/2016 10:29 AM Performed by: Lovie CholOCK, Caliope Ruppert K Pre-anesthesia Checklist: Patient identified, Emergency Drugs available, Suction available and Patient being monitored Patient Re-evaluated:Patient Re-evaluated prior to inductionOxygen Delivery Method: Circle System Utilized Preoxygenation: Pre-oxygenation with 100% oxygen Intubation Type: IV induction Ventilation: Mask ventilation without difficulty and Oral airway inserted - appropriate to patient size Laryngoscope Size: Miller and 3 Grade View: Grade I Tube type: Oral Tube size: 8.0 mm Number of attempts: 1 Airway Equipment and Method: Stylet and Oral airway Placement Confirmation: ETT inserted through vocal cords under direct vision,  positive ETCO2 and breath sounds checked- equal and bilateral Secured at: 22 cm Tube secured with: Tape Dental Injury: Teeth and Oropharynx as per pre-operative assessment

## 2016-07-01 NOTE — Transfer of Care (Signed)
Immediate Anesthesia Transfer of Care Note  Patient: Bob Reyes  Procedure(s) Performed: Procedure(s): OPEN REDUCTION INTERNAL FIXATION (ORIF) RIGHT CALCANEOUS FRACTURE (Right)  Patient Location: PACU  Anesthesia Type:GA combined with regional for post-op pain  Level of Consciousness: awake and alert   Airway & Oxygen Therapy: Patient Spontanous Breathing and Patient connected to nasal cannula oxygen  Post-op Assessment: Report given to RN, Post -op Vital signs reviewed and stable and Patient moving all extremities X 4  Post vital signs: Reviewed and stable  Last Vitals:  Vitals:   07/01/16 0950 07/01/16 1224  BP: (!) 154/86 (!) 142/94  Pulse: (!) 103 (!) 104  Resp: 15 (!) 22  Temp:  37.3 C    Last Pain:  Vitals:   07/01/16 1224  TempSrc:   PainSc: 0-No pain      Patients Stated Pain Goal: 2 (06/28/16 1755)  Complications: No apparent anesthesia complications

## 2016-07-01 NOTE — Interval H&P Note (Signed)
History and Physical Interval Note:  07/01/2016 9:14 AM  Bob Reyes  has presented today for surgery, with the diagnosis of Right Calcaneus Fracture  The various methods of treatment have been discussed with the patient and family. After consideration of risks, benefits and other options for treatment, the patient has consented to  Procedure(s): OPEN REDUCTION INTERNAL FIXATION (ORIF) RIGHT CALCANEOUS FRACTURE (Right) as a surgical intervention .  The patient's history has been reviewed, patient examined, no change in status, stable for surgery.  I have reviewed the patient's chart and labs.  Questions were answered to the patient's satisfaction.     Eldred MangesMark C Seth Friedlander

## 2016-07-01 NOTE — Progress Notes (Signed)
Medicine attending: Blood pressure (!) 143/87, pulse (!) 120, temperature 100.1 F (37.8 C), temperature source Axillary, resp. rate 18, height 6\' 5"  (1.956 m), weight 225 lb (102.1 kg), SpO2 98 %. He tolerated second surgery on the right calcaneal fracture without incident. He is comfortable.  No dyspnea.  Oxygen saturation 98% on room air.  Regular cardiac rhythm.  Rapid rate.  Lungs are clear to auscultation.  Abdomen soft.  Calves are nontender.  Bulky bilateral lower extremity surgical dressings. AM. labs stable.  Borderline decrease in potassium. Impression: 1.  Clinically stable status post bilateral traumatic calcaneal fracture repair 2.  Undifferentiated schizophrenia 3.  Systolic hypertension-borderline.  We will consider adding antihypertensive medication prior to discharge. 4.  Asymptomatic tachycardia and low-grade fever immediately post op.  Continue to monitor.

## 2016-07-01 NOTE — Brief Op Note (Signed)
06/27/2016 - 07/01/2016  12:39 PM  PATIENT:  Bob Reyes  36 y.o. male  PRE-OPERATIVE DIAGNOSIS:  Right Calcaneus Fracture  POST-OPERATIVE DIAGNOSIS:  Right Calcaneus Fracture  PROCEDURE:  Procedure(s): OPEN REDUCTION INTERNAL FIXATION (ORIF) RIGHT CALCANEOUS FRACTURE (Right)  SURGEON:  Surgeon(s) and Role:    * Eldred MangesYates, Mark C, MD - Primary  PHYSICIAN ASSISTANT: Zonia Kiefjames Venola Castello     ANESTHESIA:   general  EBL:  Total I/O In: 1000 [I.V.:1000] Out: 20 [Blood:20]  BLOOD ADMINISTERED:none  DRAINS: none   LOCAL MEDICATIONS USED:  NONE  SPECIMEN:  No Specimen  DISPOSITION OF SPECIMEN:  N/A  COUNTS:  YES  TOURNIQUET:   Total Tourniquet Time Documented: Thigh (Right) - 55 minutes Total: Thigh (Right) - 55 minutes   DICTATION: .Reubin Milanragon Dictation  PATIENT DISPOSITION:  PACU - hemodynamically stable.

## 2016-07-01 NOTE — Anesthesia Preprocedure Evaluation (Addendum)
Anesthesia Evaluation  Patient identified by MRN, date of birth, ID band Patient awake    Reviewed: Allergy & Precautions, NPO status , Patient's Chart, lab work & pertinent test results  Airway Mallampati: II  TM Distance: >3 FB Neck ROM: Full    Dental no notable dental hx. (+) Teeth Intact, Dental Advisory Given   Pulmonary neg pulmonary ROS,    Pulmonary exam normal breath sounds clear to auscultation       Cardiovascular negative cardio ROS Normal cardiovascular exam Rhythm:Regular Rate:Normal     Neuro/Psych Schizophrenia negative neurological ROS     GI/Hepatic negative GI ROS, Neg liver ROS,   Endo/Other  negative endocrine ROS  Renal/GU negative Renal ROS  negative genitourinary   Musculoskeletal negative musculoskeletal ROS (+)   Abdominal   Peds negative pediatric ROS (+)  Hematology negative hematology ROS (+)   Anesthesia Other Findings   Reproductive/Obstetrics negative OB ROS                            Anesthesia Physical Anesthesia Plan  ASA: II  Anesthesia Plan: General and Regional   Post-op Pain Management:    Induction: Intravenous  Airway Management Planned: Oral ETT  Additional Equipment:   Intra-op Plan:   Post-operative Plan: Extubation in OR  Informed Consent: I have reviewed the patients History and Physical, chart, labs and discussed the procedure including the risks, benefits and alternatives for the proposed anesthesia with the patient or authorized representative who has indicated his/her understanding and acceptance.   Dental advisory given  Plan Discussed with: CRNA and Surgeon  Anesthesia Plan Comments:         Anesthesia Quick Evaluation

## 2016-07-01 NOTE — Op Note (Signed)
Preop diagnosis: Right comminuted displaced closed calcaneus fracture.  Postop diagnosis: Same  Procedure: ORIF right calcaneus fracture lateral plating.  Surgeon: Annell GreeningMark Keslie Gritz M.D.  Assistant: Zonia KiefJames Owens PA-C medically necessary and present for the entire procedure  Anesthesia Gen.  Tourniquet time: Approximately 1 hour see anesthetic record  Implants large show lateral Biomet calcaneal plate with the locking and nonlocking screws.  Brief history 36 year old male jumped off a balcony suffering bilateral calcaneus fractures. Right foot has significantly more swelling and left calcaneus was 62 days ago and he is brought back to have his right calcaneus fixed now the swelling is down.  Procedure: Absent prepping draping the patient lateral position beanbag axillary roll careful padding Ancef prophylaxis in timeout procedure with proximal thigh tourniquet. Foot was prepped with DuraPrep the usual extremity sheets drapes sterile glove over the toes stockinette were applied. Leg was wrapped an Esmarch tourniquet inflated. Skin marker was used for lateral incision starting between the Achilles tendon and peroneal tendon following down to the heel surface with a near 90 turn and then extending in line with calcaneus. Sharp dissection keeping the flap thick directly off of the bone feelings soft tissue directly off the cortex of bone was performed until distally the peroneal tendons were visualized elevated and small narrow Hohmann retractors were placed. Cortical window was made and osteotomes, wouldn't handle periosteal elevator was used after a shots pain was placed in the tuberosity of the calcaneus after stab incision correcting Boehler's angle and then elevating the fragment back up to the stable superior-medial fragment. Once this was reduced visualized under C-arm pins were placed with K wires for stabilization still pulling on the signs and plate was attached adjusted K wires were placed and once  it was satisfactorily positioned initially nonlocking screws used pulled plate directly down to the bone and then combination of locking and nonlocking screws were placed appropriately. Intermittently the Harris heel view with the C armor drape was used to confirm screws were adequate length and penetrated just up to the cortex. For placing the plate with the defect from elevating the fragment of the articular surface back up to its anatomic position some DBX bone putty was used to pack into the defect compacting with a bone impactor pushing and elevating it and filling the defect. With plate in place screws were placed checked intermittently with C-arm once all screws holes were filled final spot pictures were taken confirming good reduction no screws in the joint is screws penetrating medially where they could damage the medial neurovascular bundle or tendons irrigation with saline solution tourniquet deflation and then closure with 2-0 nylon. Skin flap was pink looked good. There was some ecchymosis from the original fracture as expected. Xeroform 4 x 4's a large cotton wrap was applied with Caban. Patient will be nonweightbearing for 8 weeks. He will need skilled nursing facility placement since he lives by himself and has schizophrenia.

## 2016-07-01 NOTE — Anesthesia Procedure Notes (Signed)
Anesthesia Regional Block: Popliteal block   Pre-Anesthetic Checklist: ,, timeout performed, Correct Patient, Correct Site, Correct Laterality, Correct Procedure,, site marked, risks and benefits discussed, Surgical consent,  Pre-op evaluation,  At surgeon's request and post-op pain management  Laterality: Right  Prep: chloraprep       Needles:  Injection technique: Single-shot  Needle Type: Echogenic Stimulator Needle     Needle Length: 9cm  Needle Gauge: 21     Additional Needles:   Procedures: ultrasound guided,,,,,,,,  Narrative:  Start time: 07/01/2016 9:30 AM End time: 07/01/2016 9:40 AM Injection made incrementally with aspirations every 5 mL.  Performed by: Personally  Anesthesiologist: Karna ChristmasELLENDER, RYAN P  Additional Notes: Functioning IV was confirmed and monitors were applied.  A 90mm 21ga Arrow echogenic stimulator needle was used. Sterile prep,hand hygiene and sterile gloves were used.  Negative aspiration and negative test dose prior to incremental administration of local anesthetic. The patient tolerated the procedure well.

## 2016-07-01 NOTE — Progress Notes (Signed)
Subjective: Currently, the patient is doing well. Ready for OR today for 2nd surgery. S/w CSW regarding SNF for rehab.  Objective: Vital signs in last 24 hours: Vitals:   06/30/16 0300 06/30/16 1300 06/30/16 2020 07/01/16 0448  BP: (!) 147/88 132/70 129/72 126/77  Pulse: (!) 105 100 83 68  Resp: 18  18 16   Temp: 99.1 F (37.3 C) 98.8 F (37.1 C) 99.4 F (37.4 C) 98.1 F (36.7 C)  TempSrc: Oral Oral Oral Oral  SpO2: 98% 97% 99% 98%  Weight:      Height:       Physical Exam: Physical Exam  Constitutional: He is oriented to person, place, and time. He appears well-developed and well-nourished. He is cooperative. No distress.  HENT:  Head: Normocephalic and atraumatic.  Right Ear: Hearing normal.  Left Ear: Hearing normal.  Nose: Nose normal.  Mouth/Throat: Mucous membranes are normal.  Cardiovascular: Normal rate, regular rhythm, S1 normal, S2 normal and intact distal pulses.  Exam reveals no gallop.   No murmur heard. Pulmonary/Chest: Effort normal and breath sounds normal. No respiratory distress. He has no wheezes. He has no rhonchi. He has no rales. He exhibits no tenderness.  Abdominal: Soft. Normal appearance and bowel sounds are normal. He exhibits no ascites. There is no hepatosplenomegaly. There is no tenderness.  Musculoskeletal:  R foot in splint and wrap. L ankle in sterile dressing from OR.  Neurological: He is alert and oriented to person, place, and time. He has normal strength.  Skin: Skin is warm, dry and intact. He is not diaphoretic.  Psychiatric: His behavior is normal.   Labs: CBC:  Recent Labs Lab 06/27/16 1837 06/27/16 2106  WBC 18.2*  --   NEUTROABS  --  12.7*  HGB 16.1  --   HCT 45.3  --   MCV 83.1  --   PLT 271  --    Metabolic Panel:  Recent Labs Lab 06/27/16 1837 06/29/16 0654 06/30/16 0546 07/01/16 0432  NA 139  --  136 137  K 3.6  --  3.5 3.4*  CL 103  --  98* 102  CO2 22  --  26 26  GLUCOSE 77  --  119* 110*  BUN 12  --   11 15  CREATININE 1.22  --  1.14 1.09  CALCIUM 9.7  --  9.0 8.6*  ALT  --  31  --   --   ALKPHOS  --  70  --   --   BILITOT  --  1.7*  --   --   PROT  --  7.0  --   --   ALBUMIN  --  4.0  --   --     Medications: Scheduled Medications: . aspirin  325 mg Oral Daily  . docusate  100 mg Oral BID  . OLANZapine zydis  10 mg Oral q morning - 10a   PRN Medications: acetaminophen **OR** acetaminophen, HYDROmorphone (DILAUDID) injection, metoCLOPramide **OR** metoCLOPramide (REGLAN) injection, oxyCODONE, polyethylene glycol  Assessment/Plan: [name] is a 36 y.o. male with schizophrenia who presents with bilateral calcaneal fractures after jumping off a balcony.  1) Schizophrenia: Stable. - continue Zyprexa 10mg   2) Bilateral closed, comminuted calcaneal fx: s/p ORIF, plan R foot today. Appreciate Ortho management. DC to SNF when stable. - pain control post-op per Ortho - PT/OT - CSW for Dispo  Length of Stay: 4 day(s) Dispo: Anticipated discharge to SNF after stabilization post-surgery.  Carolynn CommentStrelow, Bob Autry, MD  Pager: [phone] (7AM-5PM) 07/01/2016, 7:32 AM

## 2016-07-01 NOTE — Clinical Social Work Note (Signed)
Clinical Social Work Assessment  Patient Details  Name: Bob Reyes MRN: [id number] Date of Birth: 05/16/1980  Date of referral:  07/01/16               Reason for consult:  Facility Placement                Permission sought to share information with:  Facility Contact Representative Permission granted to share information::  Yes, Verbal Permission Granted  Name::        Agency::  SNF  Relationship::     Contact Information:     Housing/Transportation Living arrangements for the past 2 months:  Apartment Source of Information:  Patient Patient Interpreter Needed:  None Criminal Activity/Legal Involvement Pertinent to Current Situation/Hospitalization:  No - Comment as needed Significant Relationships:  Other Family Members, Parents, Friend Lives with:  Self Do you feel safe going back to the place where you live?  No Need for family participation in patient care:  No (Coment)  Care giving concerns:  Patient resides alone and unsafe to return home at this time due to impairment.  Social Worker assessment / plan:  CSW met with patient and family at bedside. CSW explained her role and the DC recommendations form clinical team. Patient in agreement with SNF at DC. CSW explained SNF options and placement. CSW discussed limited options for placement due to insurance. CSW obtained permission from patient to send out offers to local area.  Employment status:  Unemployed, Disabled (Comment on whether or not currently receiving Disability) Insurance information:  Medicaid In State PT Recommendations:  Skilled Nursing Facility Information / Referral to community resources:  Skilled Nursing Facility  Patient/Family's Response to care:  Patient and family appreciative of CSW assistance with SNF placement at DC. No issues or concerns reported at this time.  Patient/Family's Understanding of and Emotional Response to Diagnosis, Current Treatment, and Prognosis:  CSW has good understanding of  diagnosis, current treatment and prognosis and is hopeful that skilled nursing needs will assist with impairment stability. No issues or concerns reported to CSW at this time.  Emotional Assessment Appearance:  Appears stated age Attitude/Demeanor/Rapport:   (Cooperative) Affect (typically observed):  Accepting, Appropriate Orientation:  Oriented to Self, Oriented to Place, Oriented to  Time, Oriented to Situation Alcohol / Substance use:  Not Applicable Psych involvement (Current and /or in the community):  No (Comment)  Discharge Needs  Concerns to be addressed:  Care Coordination Readmission within the last 30 days:  No Current discharge risk:  Physical Impairment, Dependent with Mobility Barriers to Discharge:  No Barriers Identified    V , LCSW 07/01/2016, 8:04 AM  

## 2016-07-02 LAB — CULTURE, BLOOD (ROUTINE X 2)
CULTURE: NO GROWTH
Culture: NO GROWTH
Special Requests: ADEQUATE
Special Requests: ADEQUATE

## 2016-07-02 LAB — BASIC METABOLIC PANEL
Anion gap: 9 (ref 5–15)
BUN: 13 mg/dL (ref 6–20)
CALCIUM: 8.6 mg/dL — AB (ref 8.9–10.3)
CO2: 26 mmol/L (ref 22–32)
CREATININE: 1.01 mg/dL (ref 0.61–1.24)
Chloride: 103 mmol/L (ref 101–111)
Glucose, Bld: 119 mg/dL — ABNORMAL HIGH (ref 65–99)
Potassium: 3.7 mmol/L (ref 3.5–5.1)
SODIUM: 138 mmol/L (ref 135–145)

## 2016-07-02 MED ORDER — RIVAROXABAN 10 MG PO TABS: 10.0000 mg | ORAL_TABLET | Freq: Every day | ORAL | 0 refills | Status: DC

## 2016-07-02 MED ORDER — POLYETHYLENE GLYCOL 3350 17 G PO PACK: 17.0000 g | PACK | Freq: Every day | ORAL | 0 refills | Status: DC | PRN

## 2016-07-02 MED ORDER — DOCUSATE SODIUM 50 MG/5ML PO LIQD: 100.0000 mg | Freq: Two times a day (BID) | ORAL | 0 refills | Status: DC

## 2016-07-02 MED ORDER — OXYCODONE HCL 5 MG PO TABS: 5.0000 mg | ORAL_TABLET | ORAL | 0 refills | Status: DC | PRN

## 2016-07-02 NOTE — Progress Notes (Signed)
Medicine attending: I personally examined this patient today and I concur with the evaluation and management plan as recorded in the progress note by resident physician Dr. Nicholes RoughBrian Street low. He is stable postop day 1 repair of right calcaneal fracture and 3 days postop repair of left calcaneal fracture.  Pulse rate has slowed.  Maximum temperature decreased to 99.2.  Blood pressure has normalized without additional medication.  Oxygen saturation remains 98% on room air.  Lungs are clear.  Regular cardiac rhythm.  Both the lower extremity dressings.  No calf tenderness. We will begin prophylactic dose Xarelto 10 mg daily in anticipation of prolonged immobilization. Plan to transfer to a rehab facility when bed available.

## 2016-07-02 NOTE — Progress Notes (Signed)
Subjective: 1 Day Post-Op Procedure(s) (LRB): OPEN REDUCTION INTERNAL FIXATION (ORIF) RIGHT CALCANEOUS FRACTURE (Right) Patient reports pain as mild.    Objective: Vital signs in last 24 hours: Temp:  [99 F (37.2 C)-100.1 F (37.8 C)] 99 F (37.2 C) (05/19 0445) Pulse Rate:  [90-120] 90 (05/19 0445) Resp:  [15-22] 18 (05/18 1447) BP: (132-154)/(77-94) 134/85 (05/19 0445) SpO2:  [97 %-100 %] 98 % (05/19 0445)  Intake/Output from previous day: 05/18 0701 - 05/19 0700 In: 2327.5 [P.O.:480; I.V.:1847.5] Out: 1020 [Urine:1000; Blood:20] Intake/Output this shift: No intake/output data recorded.  No results for input(s): HGB in the last 72 hours. No results for input(s): WBC, RBC, HCT, PLT in the last 72 hours.  Recent Labs  07/01/16 0432 07/02/16 0539  NA 137 138  K 3.4* 3.7  CL 102 103  CO2 26 26  BUN 15 13  CREATININE 1.09 1.01  GLUCOSE 110* 119*  CALCIUM 8.6* 8.6*   No results for input(s): LABPT, INR in the last 72 hours.  Neurologically intact Dorsiflexion/Plantar flexion intact  Assessment/Plan: 1 Day Post-Op Procedure(s) (LRB): OPEN REDUCTION INTERNAL FIXATION (ORIF) RIGHT CALCANEOUS FRACTURE (Right) Advance diet POD #3 left calcaneous, POD#1 right-comfortable, awaiting rehab placement on Monday. Bulky dressings in place, no toe edema, good cap refill Valeria Batmaneter W Dorrell [place]ree 07/02/2016, 9:43 AM

## 2016-07-02 NOTE — Discharge Summary (Signed)
Name: Bob GladdenJosh M Reyes MRN: [id number] DOB: 1981/01/12 36 y.o. PCP: Patient, No Pcp Per  Date of Admission: 06/27/2016  2:32 PM Date of Discharge: 07/04/2016 Attending Physician: Burns SpainButcher, Elizabeth A, MD  Discharge Diagnosis: Principal Problem:   Bilateral calcaneal fractures Active Problems:   Paranoid schizophrenia, chronic condition (HCC)   Closed fracture of bone of left foot   Closed fracture of bone of right foot   Discharge Medications: Allergies as of 07/04/2016   No Known Allergies     Medication List    TAKE these medications   docusate 50 MG/5ML liquid Commonly known as:  COLACE Take 10 mLs (100 mg total) by mouth 2 (two) times daily.   OLANZapine zydis 10 MG disintegrating tablet Commonly known as:  ZYPREXA Take 10 mg by mouth every morning.   oxyCODONE 5 MG immediate release tablet Commonly known as:  Oxy IR/ROXICODONE Take 1 tablet (5 mg total) by mouth every 4 (four) hours as needed for breakthrough pain.   polyethylene glycol packet Commonly known as:  MIRALAX / GLYCOLAX Take 17 g by mouth daily as needed for mild constipation.   rivaroxaban 10 MG Tabs tablet Commonly known as:  XARELTO Take 1 tablet (10 mg total) by mouth daily.            Durable Medical Equipment        Start     Ordered   07/02/16 1235  For home use only DME standard manual wheelchair with seat cushion  (Wheelchairs)  Once    Comments:  Patient suffers from bilateral calcaneal fractures which impairs their ability to perform daily activities like bathing, dressing, feeding, grooming and toileting in the home.  A cane, crutch or walker will not resolve issue with performing activities of daily living. A wheelchair will allow patient to safely perform daily activities. Patient can safely propel the wheelchair in the home or has a caregiver who can provide assistance.  Accessories: elevating leg rests (ELRs), wheel locks, extensions and anti-tippers.   07/02/16 1236       Disposition and follow-up:   Bob Reyes [facility] Good [facility].  At the hospital follow up visit please address:  1.  Bilateral Calcaneal Fx: assess functional status and healing. Ensure Ortho f/u. Assess compliance with VTE prophylaxis on Xarelto. Schizophrenia: assess compliance with medication and symptoms. Ensure continued [facility] f/u. HTN: Follow-up patient's blood pressure and consider treatment if persistently elevated in the absence of acute injury.  Follow-up Appointments:  Contact information for follow-up providers    Eldred MangesYates, Mark C, MD Follow up in 2 week(s).   Specialty:  Orthopedic Surgery Contact information: [address] [place] [address] [phone]        [facility]. Call on 07/04/2016.   Why:  Go to follow up after your hospitalization and to establish care with a primary care provider. Contact information: [address] [place] [phone]           Contact information for after-discharge care    Destination    [facility] Follow up.   Specialty:  Skilled Nursing Facility Contact information: [address] [place] [id number] [phone]                  Hospital Course by problem list: Principal Problem:   Bilateral calcaneal fractures Active Problems:   Paranoid schizophrenia, chronic condition (HCC)  Closed fracture of bone of left foot   Closed fracture of bone of right foot   1. Bilateral calcaneal fractures: Patient presented with bilateral calcaneal fractures after jumping off the second story balcony at his residence. Patient reports that he was feeling euphoric and was attempting to get to a friend who was about to get in fights when he had an impulse to jump off a balcony to get there more quickly. Patient realized that he had made about decision after  restarting in the air. Patient was taken to the OR on 06/29/2016 for ORIF of the L foot and tolerated the procedure well, and was taken on 07/01/2016 for ORIF of the R foot and tolerated the procedure well. Patient was stabilized after these procedures and transferred to skilled nursing facility for continued rehabilitation. Orthopedic surgeons recommended nonweightbearing on his bilateral lower extremity's for 8 weeks. Patient was provided with a wheelchair and had PT and OT evaluation during his hospitalization. Given the patient's significant immobility of bilateral lower extremity for prolonged period, patient was provided with prophylactic anticoagulation using 10 mg Xarelto.  2. Schizophrenia: Patient is a long history of schizophreniais well controlled on Zyprexa 10 mg. He reports he was compliant with his medication and denies any hallucinations or paranoia. He reports that the impulse to jump off the building was unrelated to symptoms of his schizophrenia. He was evaluated by psychiatry during his hospitalization and thought to be well controlled on his current medicines. Patient remained stable in terms of his mental health during his hospitalization.  3. HTN: Patient has had intermittent hypertension throughout his hospitalization. However this is in the setting of acute injury and pain. Recommended patient continue to be followed up as an outpatient and that he establish care with a PCP for continued evaluation and management.  Discharge Vitals:   BP 131/71 (BP Location: Right Arm)   Pulse 90   Temp 98.4 F (36.9 C) (Oral)   Resp 16   Ht 6\' 5"  (1.956 m)   Wt 225 lb (102.1 kg)   SpO2 99%   BMI 26.68 kg/m   Pertinent Labs, Studies, and Procedures:  Recent Labs Lab 06/27/16 1837  HGB 16.1  HCT 45.3  WBC 18.2*  PLT 271   Procedures Performed:  Dg Lumbar Spine Complete Result Date: 06/27/2016 CLINICAL DATA:  Jumped from a 9 foot balcony. Lower extremity numbness and pain. EXAM:  LUMBAR SPINE - COMPLETE 4+ VIEW COMPARISON:  None. FINDINGS: Five non rib-bearing lumbar-type vertebral bodies are intact and aligned with maintenance of the lumbar lordosis. Intervertebral disc heights are normal. No destructive bony lesions. Sacroiliac joints are symmetric. Included prevertebral and paraspinal soft tissue planes are non-suspicious. IMPRESSION: Negative. Electronically Signed   By: Awilda Metro M.D.   On: 06/27/2016 17:49   Dg Tibia/fibula Left Result Date: 06/27/2016 CLINICAL DATA:  Patient reports jumping from a 63ft balcony, reports immediately after his left and right tib/fib were numb, reports now his pain is distal to both of his tib/fibs. EXAM: LEFT TIBIA AND FIBULA - 2 VIEW COMPARISON:  None. FINDINGS: No fracture of the tibia or fibula. Knee joint and ankle joint appear normal on two views. On the lateral view there is a fracture of the calcaneus which is complex involving the posterior calcaneus as well as the neck of the calcaneus with depression of the subtalar surface. IMPRESSION: 1. Complex fracture of the calcaneus.  Recommend CT of the hindfoot. 2. No tibial fracture. Electronically Signed   By: Roseanne Reno  Amil Amen M.D.   On: 06/27/2016 16:13   Dg Foot Complete Left Result Date: 06/27/2016 CLINICAL DATA:  Jumping from a balcony, left foot pain EXAM: LEFT FOOT - COMPLETE 3+ VIEW COMPARISON:  06/27/2016 FINDINGS: There is an acute comminuted fracture of the calcaneus extending into the subtalar joint articular surface. Lateral view is oblique but the calcaneus is severely flattened and deformed. The talus appears impacted upon the fractured calcaneus. No subluxation or dislocation. Diffuse soft tissue swelling. IMPRESSION: Comminuted calcaneal fracture involving the subtalar joint with soft tissue swelling. Electronically Signed   By: Judie Petit.  Shick M.D.   On: 06/27/2016 16:16   Dg Tibia/fibula Right Result Date: 06/27/2016 CLINICAL DATA:  Patient reports jumping from a 38ft  balcony, reports immediately after his left and right tib/fib were numb, reports now his pain is distal to both of his tib/fibs. EXAM: RIGHT TIBIA AND FIBULA - 2 VIEW COMPARISON:  None. FINDINGS: No acute fracture or dislocation of the right tibia or fibula. No knee joint effusion. Severely comminuted fracture of the anterior, mid and posterior right calcaneus. Relative widening of the posterior subtalar joint. IMPRESSION: 1.  No acute osseous injury of the right tibia and fibula. 2. Severely comminuted fracture of the anterior, mid and posterior calcaneus. Electronically Signed   By: Elige Ko   On: 06/27/2016 16:09   Dg Foot Complete Right Result Date: 06/27/2016 CLINICAL DATA:  Patient reports jumping from a 39ft balcony, reports immediately after his left and right tib/fib were numb, reports now his pain is distal to both of his tib/fibs. EXAM: RIGHT FOOT COMPLETE - 3+ VIEW COMPARISON:  None. FINDINGS: There is a comminuted fracture of the calcaneus, not well-defined due to positioning on the lateral view. This appears have an intra-articular component. No other fractures. The calcaneal articulations are not well visualized in this exam. Remaining foot articulations are normally spaced and aligned. There is diffuse mid and hindfoot soft tissue swelling. IMPRESSION: Comminuted fracture of the calcaneus not well visualized on this exam. Recommend follow-up CT for further characterization of the fracture. Electronically Signed   By: Amie Portland M.D.   On: 06/27/2016 16:12  Consultations: Treatment Team:  Eldred Manges, MD Leata Mouse, MD  Discharge Instructions: Discharge Instructions    Call MD for:  difficulty breathing, headache or visual disturbances    Complete by:  As directed    Call MD for:  redness, tenderness, or signs of infection (pain, swelling, redness, odor or green/yellow discharge around incision site)    Complete by:  As directed    Call MD for:  severe  uncontrolled pain    Complete by:  As directed    Call MD for:  temperature >100.4    Complete by:  As directed    Diet - low sodium heart healthy    Complete by:  As directed    Discharge instructions    Complete by:  As directed    He had fractures of both inferior heel bones. These were repaired by the orthopedic surgeons in the operating room. These will continue to heal but will take several weeks before their strong enough to walk on. He should not put any weight on your feet for 8 weeks. We will provide a wheelchair to help you get around and we will discharge her to a skilled nursing facility to help you with your rehabilitation. If you're able to acquire a residence which is handicapped assessable this will be very helpful as your insurance may  only cover rehabilitation facility for 30 days. He should've follow-up with orthopedic surgeon. In addition we would like him to follow up with her primary care physician who can continue to watch your general health, including your blood pressure. Given her prolonged immobility related to these injuries, we have prescribed a medication to help prevent any blood clots from forming in her legs. This medication is called Xarelto he will take 10 mg pill every day for 8 weeks until you're able to walk.   Other Restrictions    Complete by:  As directed    Non-weight bearing for 8 weeks.      Signed: Carolynn Comment, MD 07/04/2016, 11:15 AM   Pager: [phone]

## 2016-07-02 NOTE — Progress Notes (Signed)
Subjective: Currently, the patient is well. No complaints. No pain. Able to feel toes bilaterally. Passing flatus, no BM, will await return of bowel function. Tolerating diet.  Objective: Vital signs in last 24 hours: Vitals:   07/01/16 1239 07/01/16 1447 07/01/16 2033 07/02/16 0445  BP: (!) 141/84 (!) 143/87 132/77 134/85  Pulse: 98 (!) 120 (!) 111 90  Resp: 18 18    Temp:  100.1 F (37.8 C) 99.2 F (37.3 C) 99 F (37.2 C)  TempSrc:  Axillary Oral Oral  SpO2: 100% 98% 97% 98%  Weight:      Height:       Physical Exam: Physical Exam  Constitutional: He is oriented to person, place, and time. He appears well-developed and well-nourished. He is cooperative. No distress.  HENT:  Head: Normocephalic and atraumatic.  Right Ear: Hearing normal.  Left Ear: Hearing normal.  Nose: Nose normal.  Mouth/Throat: Mucous membranes are normal.  Cardiovascular: Normal rate, regular rhythm, S1 normal, S2 normal and intact distal pulses.  Exam reveals no gallop.   No murmur heard. Pulmonary/Chest: Effort normal and breath sounds normal. No respiratory distress. He has no wheezes. He has no rhonchi. He has no rales. He exhibits no tenderness.  Abdominal: Soft. Normal appearance and bowel sounds are normal. He exhibits no ascites. There is no hepatosplenomegaly. There is no tenderness.  Musculoskeletal:  R foot in splint and wrap. L ankle in sterile dressing from OR.  Neurological: He is alert and oriented to person, place, and time. He has normal strength.  Skin: Skin is warm, dry and intact. He is not diaphoretic.  Psychiatric: His behavior is normal.   Labs: CBC:  Recent Labs Lab 06/27/16 1837 06/27/16 2106  WBC 18.2*  --   NEUTROABS  --  12.7*  HGB 16.1  --   HCT 45.3  --   MCV 83.1  --   PLT 271  --    Metabolic Panel:  Recent Labs Lab 06/27/16 1837 06/29/16 0654 06/30/16 0546 07/01/16 0432 07/02/16 0539  NA 139  --  136 137 138  K 3.6  --  3.5 3.4* 3.7  CL 103  --   98* 102 103  CO2 22  --  26 26 26   GLUCOSE 77  --  119* 110* 119*  BUN 12  --  11 15 13   CREATININE 1.22  --  1.14 1.09 1.01  CALCIUM 9.7  --  9.0 8.6* 8.6*  ALT  --  31  --   --   --   ALKPHOS  --  70  --   --   --   BILITOT  --  1.7*  --   --   --   PROT  --  7.0  --   --   --   ALBUMIN  --  4.0  --   --   --     Medications: Scheduled Medications: . aspirin  325 mg Oral Daily  . Chlorhexidine Gluconate Cloth  6 each Topical Daily  . docusate  100 mg Oral BID  . mupirocin ointment  1 application Nasal BID  . OLANZapine zydis  10 mg Oral q morning - 10a   PRN Medications: acetaminophen **OR** acetaminophen, HYDROmorphone (DILAUDID) injection, HYDROmorphone (DILAUDID) injection, metoCLOPramide **OR** metoCLOPramide (REGLAN) injection, ondansetron **OR** ondansetron (ZOFRAN) IV, oxyCODONE, oxyCODONE **OR** oxyCODONE, polyethylene glycol, promethazine  Assessment/Plan: [name] is a 36 y.o. male with schizophrenia who presents with bilateral calcaneal fractures after jumping  off a balcony.  1) Schizophrenia: Stable. - continue Zyprexa 10mg   2) Bilateral closed, comminuted calcaneal fx: s/p ORIF x2. Appreciate Ortho management. DC to SNF when stable. - pain control post-op per Ortho - await return of bowel function, passing flatus no BM - PT/OT - CSW for Dispo  Length of Stay: 5 day(s) Dispo: Anticipated discharge to SNF tomorrow/Monday.  Carolynn Comment, MD Pager: [phone] (7AM-5PM) 07/02/2016, 12:18 PM

## 2016-07-03 MED ORDER — SENNOSIDES-DOCUSATE SODIUM 8.6-50 MG PO TABS
2.0000 | ORAL_TABLET | Freq: Two times a day (BID) | ORAL | Status: DC
  Administered 2016-07-03 – 2016-07-04 (×2): 2 via ORAL
  Filled 2016-07-03 (×2): qty 2

## 2016-07-03 MED ORDER — POLYETHYLENE GLYCOL 3350 17 G PO PACK
17.0000 g | PACK | Freq: Every day | ORAL | Status: DC
  Administered 2016-07-04: 17 g via ORAL
  Filled 2016-07-03: qty 1

## 2016-07-03 NOTE — Progress Notes (Signed)
CSW attempted assessment but phone at [phone] was busy.  Please reconsult if future social work needs arise.    Dorothe PeaJonathan F. Daeshawn Redmann, LCSWA, LCAS Clinical Social Worker Ph: [phone]

## 2016-07-03 NOTE — Progress Notes (Signed)
Subjective: Currently, the patient is well. No pain. Able to feel toes bilaterally. Passing flatus, no BM in 2 days. Tolerating diet well.  Objective: Vital signs in last 24 hours: Vitals:   07/02/16 0445 07/02/16 1405 07/02/16 2002 07/03/16 0615  BP: 134/85 (!) 145/77 134/78 130/78  Pulse: 90 84 86 84  Resp:  16    Temp: 99 F (37.2 C) 98.5 F (36.9 C) 98.3 F (36.8 C) 98.2 F (36.8 C)  TempSrc: Oral Axillary Oral Oral  SpO2: 98% 99% 98% 98%  Weight:      Height:       Physical Exam: Physical Exam  Constitutional: He is oriented to person, place, and time. He appears well-developed and well-nourished. He is cooperative. No distress.  HENT:  Head: Normocephalic and atraumatic.  Cardiovascular: Normal rate, regular rhythm, S1 normal, S2 normal and intact distal pulses.  Exam reveals no gallop.   No murmur heard. Pulmonary/Chest: Effort normal and breath sounds normal. No respiratory distress. He has no wheezes. He has no rhonchi. He has no rales. He exhibits no tenderness.  Abdominal: Soft. Normal appearance and bowel sounds are normal. He exhibits no ascites. There is no hepatosplenomegaly. There is no tenderness.  Musculoskeletal:  Bilateral lower extremities in splint and wrap.   Neurological: He is alert and oriented to person, place, and time. He has normal strength.  Skin: Skin is warm, dry and intact. He is not diaphoretic.  Psychiatric: His behavior is normal.   Labs: CBC:  Recent Labs Lab 06/27/16 1837 06/27/16 2106  WBC 18.2*  --   NEUTROABS  --  12.7*  HGB 16.1  --   HCT 45.3  --   MCV 83.1  --   PLT 271  --    Metabolic Panel:  Recent Labs Lab 06/27/16 1837 06/29/16 0654 06/30/16 0546 07/01/16 0432 07/02/16 0539  NA 139  --  136 137 138  K 3.6  --  3.5 3.4* 3.7  CL 103  --  98* 102 103  CO2 22  --  26 26 26   GLUCOSE 77  --  119* 110* 119*  BUN 12  --  11 15 13   CREATININE 1.22  --  1.14 1.09 1.01  CALCIUM 9.7  --  9.0 8.6* 8.6*  ALT  --   31  --   --   --   ALKPHOS  --  70  --   --   --   BILITOT  --  1.7*  --   --   --   PROT  --  7.0  --   --   --   ALBUMIN  --  4.0  --   --   --     Medications: Scheduled Medications: . aspirin  325 mg Oral Daily  . Chlorhexidine Gluconate Cloth  6 each Topical Daily  . docusate  100 mg Oral BID  . mupirocin ointment  1 application Nasal BID  . OLANZapine zydis  10 mg Oral q morning - 10a   PRN Medications: acetaminophen **OR** acetaminophen, HYDROmorphone (DILAUDID) injection, HYDROmorphone (DILAUDID) injection, metoCLOPramide **OR** metoCLOPramide (REGLAN) injection, ondansetron **OR** ondansetron (ZOFRAN) IV, oxyCODONE, oxyCODONE **OR** oxyCODONE, polyethylene glycol, promethazine  Assessment/Plan: Bob Reyes is a 36 y.o. male with schizophrenia who presents with bilateral calcaneal fractures after jumping off a balcony.  1) Schizophrenia: Stable. - continue Zyprexa 10mg   2) Bilateral closed, comminuted calcaneal fx: s/p ORIF x2. Appreciate Ortho management. DC to SNF when stable.  Await return of bowel function, passing flatus no BM in 2 days.  - pain control post-op per Ortho - Miralax daily and Senokot-S 2 tabs BID for constipation  - PT/OT - CSW for Dispo  Length of Stay: 6 day(s) Dis[facility]: [facility]  Bob Reyes, Bob Sagan, MD Pager: [phone] (7AM-5PM) 07/03/2016, 7:23 AM

## 2016-07-03 NOTE — Progress Notes (Signed)
Subjective: 2 Days Post-Op Procedure(s) (LRB): OPEN REDUCTION INTERNAL FIXATION (ORIF) RIGHT CALCANEOUS FRACTURE (Right) Patient reports pain as mild.    Objective: Vital signs in last 24 hours: Temp:  [98.2 F (36.8 C)-98.5 F (36.9 C)] 98.2 F (36.8 C) (05/20 0615) Pulse Rate:  [84-86] 84 (05/20 0615) Resp:  [16] 16 (05/19 1405) BP: (130-145)/(77-78) 130/78 (05/20 0615) SpO2:  [98 %-99 %] 98 % (05/20 0615)  Intake/Output from previous day: 05/19 0701 - 05/20 0700 In: 720 [P.O.:720] Out: -  Intake/Output this shift: Total I/O In: 240 [P.O.:240] Out: -   No results for input(s): HGB in the last 72 hours. No results for input(s): WBC, RBC, HCT, PLT in the last 72 hours.  Recent Labs  07/01/16 0432 07/02/16 0539  NA 137 138  K 3.4* 3.7  CL 102 103  CO2 26 26  BUN 15 13  CREATININE 1.09 1.01  GLUCOSE 110* 119*  CALCIUM 8.6* 8.6*   No results for input(s): LABPT, INR in the last 72 hours.  Neurologically intact Sensation intact distally  Assessment/Plan: 2 Days Post-Op Procedure(s) (LRB): OPEN REDUCTION INTERNAL FIXATION (ORIF) RIGHT CALCANEOUS FRACTURE (Right) Discharge to SNF Comfortable without complaints-SS to discuss rehab in am Bob Reyes 07/03/2016, 11:01 AM

## 2016-07-03 NOTE — Evaluation (Signed)
Physical Therapy Evaluation Patient Details Name: Bob Reyes MRN: [id number] DOB: March 08, 1980 Today's Date: 07/03/2016   History of Present Illness  Bob Reyes a 36 y.o.malewith schizophrenia who presents with bilateral calcaneal fractures after jumping off a balcony. Bilateral calcaneal fxs, nows/p ORIFs; PMH of schizophrenia  Clinical Impression   Patient is s/p above surgery resulting in functional limitations due to the deficits listed below (see PT Problem List). Overall moving well in bed and with lateral scoot transfers;  Will need continued wheelchair training; He is well aware that he must keep NWB bilateral LEs for at least 8 weeks and adheres to NWB well; Should do well at wc level; Patient will benefit from skilled PT to increase their independence and safety with mobility to allow discharge to the venue listed below.       Follow Up Recommendations SNF;Other (comment) (My hope would be that he recieves therapies at SNF, however, I don't know that with his current insurance he will get therapies - pt tells me his mother will help him pay if needed; I have tasked him with getting with his landlord to move into a first floor wheelchair accessible apartment )    Equipment Recommendations  Wheelchair (measurements PT);Wheelchair cushion (measurements PT);Other (comment) (drop-arm BSC)    Recommendations for Other Services OT consult     Precautions / Restrictions Restrictions RLE Weight Bearing: Non weight bearing LLE Weight Bearing: Non weight bearing      Mobility  Bed Mobility Overal bed mobility: Needs Assistance Bed Mobility: Supine to Sit     Supine to sit: Supervision     General bed mobility comments: Cues for technique; moving very well  Transfers Overall transfer level: Needs assistance   Transfers: Lateral/Scoot Transfers          Lateral/Scoot Transfers: Min guard General transfer comment: Minguard for safety; verbal and demo cues for  technqiue; moving very well  Ambulation/Gait                Stairs            Wheelchair Mobility    Modified Rankin (Stroke Patients Only)       Balance                                             Pertinent Vitals/Pain Pain Assessment: 0-10 Pain Score: 2  Pain Location: bil feet Pain Descriptors / Indicators: Throbbing Pain Intervention(s): Monitored during session    Home Living Family/patient expects to be discharged to:: Skilled nursing facility                 Additional Comments: My hope would be that he recieves therapies at SNF, however, I don't know that with his current insurance he will get therapies; I have tasked him with getting with his landlord to move into a first floor wheelchair accessible apartment    Prior Function Level of Independence: Independent               Hand Dominance        Extremity/Trunk Assessment   Upper Extremity Assessment Upper Extremity Assessment: Overall WFL for tasks assessed    Lower Extremity Assessment Lower Extremity Assessment: RLE deficits/detail;LLE deficits/detail RLE Deficits / Details: Bulky dressings bil feet; positive active toe wiggle and light touch is intact toes LLE Deficits / Details: Bulky dressings bil feet;  positive active toe wiggle and light touch is intact toes       Communication   Communication: No difficulties  Cognition Arousal/Alertness: Awake/alert Behavior During Therapy: WFL for tasks assessed/performed Overall Cognitive Status: Within Functional Limits for tasks assessed                                        General Comments      Exercises     Assessment/Plan    PT Assessment Patient needs continued PT services  PT Problem List Decreased strength;Decreased range of motion;Decreased balance;Decreased mobility;Decreased knowledge of use of DME;Decreased knowledge of precautions;Pain       PT Treatment Interventions  DME instruction;Functional mobility training;Therapeutic activities;Therapeutic exercise;Balance training;Patient/family education;Wheelchair mobility training    PT Goals (Current goals can be found in the Care Plan section)  Acute Rehab PT Goals Patient Stated Goal: seems happy to be oOB PT Goal Formulation: With patient Time For Goal Achievement: 07/10/16 Additional Goals Additional Goal #1: Bob Reyes with manage wheelchair including legrests safely and propell wc 100 feet including turns     Frequency Min 3X/week   Barriers to discharge Other (comment) Pt tells me he is working with landlord to get moved to wheelchair accessible apartment    Co-evaluation               AM-PAC PT "6 Clicks" Daily Activity  Outcome Measure Difficulty turning over in bed (including adjusting bedclothes, sheets and blankets)?: A Little Difficulty moving from lying on back to sitting on the side of the bed? : None Difficulty sitting down on and standing up from a chair with arms (e.g., wheelchair, bedside commode, etc,.)?: Total Help needed moving to and from a bed to chair (including a wheelchair)?: A Little Help needed walking in hospital room?: Total Help needed climbing 3-5 steps with a railing? : Total 6 Click Score: 13    End of Session   Activity Tolerance: Patient tolerated treatment well Patient left: in chair;with call bell/phone within reach;with chair alarm set Nurse Communication: Mobility status PT Visit Diagnosis: Other abnormalities of gait and mobility (R26.89)    Time: 0941-1000 PT Time Calculation (min) (ACUTE ONLY): 19 min   Charges:   PT Evaluation $PT Eval Low Complexity: 1 Procedure     PT G Codes:        Van ClinesHolly Ezzard Ditmer, PT  Acute Rehabilitation Services Pager [phone] Office [phone]   Levi AlandHolly H Marsh Heckler 07/03/2016, 11:26 AM

## 2016-07-04 ENCOUNTER — Encounter (HOSPITAL_COMMUNITY): Payer: Self-pay | Admitting: Orthopaedic Surgery

## 2016-07-04 NOTE — Social Work (Signed)
Clinical Social Worker facilitated patient discharge including contacting patient family and facility to confirm patient discharge plans.  Clinical information faxed to facility and family agreeable with plan.  CSW arranged ambulance transport via [facility] to [facility] .  RN to call [phone] report prior to discharge.  Clinical Social Worker will sign off for now as social work intervention is no longer needed. Please consult us again if new need arises.  Keene BreathPatricia Grady Lucci, LCSW Clinical Social Worker [phone]

## 2016-07-04 NOTE — Progress Notes (Signed)
Orthopedic Tech Progress Note Patient Details:  Claris GladdenJosh M Shingler 10/18/1980 [id number]  Ortho Devices Type of Ortho Device: Ace wrap, Lenora BoysWatson Jones splint Ortho Device/Splint Location: bilateral Ortho Device/Splint Interventions: Application   Winter Jocelyn 07/04/2016, 11:54 AM As ordered by Dr. Ophelia CharterYates

## 2016-07-04 NOTE — Progress Notes (Signed)
   Subjective: 3 Days Post-Op Procedure(s) (LRB): OPEN REDUCTION INTERNAL FIXATION (ORIF) RIGHT CALCANEOUS FRACTURE (Right) Patient reports pain as 0 on 0-10 scale.    Objective: Vital signs in last 24 hours: Temp:  [98 F (36.7 C)-98.4 F (36.9 C)] 98.4 F (36.9 C) (05/21 0409) Pulse Rate:  [90-94] 90 (05/21 0409) Resp:  [16] 16 (05/20 1500) BP: (129-135)/(70-77) 131/71 (05/21 0409) SpO2:  [95 %-99 %] 99 % (05/21 0409)  Intake/Output from previous day: 05/20 0701 - 05/21 0700 In: 1545 [P.O.:720; I.V.:825] Out: 600 [Urine:600] Intake/Output this shift: No intake/output data recorded.  No results for input(s): HGB in the last 72 hours. No results for input(s): WBC, RBC, HCT, PLT in the last 72 hours.  Recent Labs  07/02/16 0539  NA 138  K 3.7  CL 103  CO2 26  BUN 13  CREATININE 1.01  GLUCOSE 119*  CALCIUM 8.6*   No results for input(s): LABPT, INR in the last 72 hours.  dressing taken down to incision and skin flap looks good. new dressing to be applied shortly by Ortho tech and RN for each foot.  No results found.  Assessment/Plan: 3 Days Post-Op Procedure(s) (LRB): OPEN REDUCTION INTERNAL FIXATION (ORIF) RIGHT CALCANEOUS FRACTURE (Right) Discharge to SNF when bed available. Office 2 wks.    Eldred MangesMark C Lorin Hauck 07/04/2016, 10:17 AM

## 2016-07-04 NOTE — Progress Notes (Signed)
[facility]. IV removed. [facility] Nurse called and give report to Marathon Oilndrea Coshman.

## 2016-07-04 NOTE — Clinical Social Work Placement (Signed)
   CLINICAL SOCIAL WORK PLACEMENT  NOTE  Date:  07/04/2016  Patient Details  Name: Bob Reyes MRN: [id number] Date of Birth: 08-23-1980  Clinical Social Work is seeking post-discharge placement for this patient at the Skilled  Nursing Facility level of care (*CSW will initial, date and re-position this form in  chart as items are completed):  Yes   Patient/family provided with Secretary Clinical Social Work Department's list of facilities offering this level of care within the geographic area requested by the patient (or if unable, by the patient's family).  Yes   Patient/family informed of their freedom to choose among providers that offer the needed level of care, that participate in Medicare, Medicaid or managed care program needed by the patient, have an available bed and are willing to accept the patient.  Yes   Patient/family informed of [facility]'s ownership interest in [facility] and [facility], as well as of the fact that they are under no obligation to receive care at these facilities.  PASRR submitted to EDS on       PASRR number received on 07/04/16     Existing PASRR number confirmed on 06/30/16     FL2 transmitted to all facilities in geographic area requested by pt/family on 06/30/16     FL2 transmitted to all facilities within larger geographic area on 06/30/16     Patient informed that his/her managed care company has contracts with or will negotiate with certain facilities, including the following:        Yes   Patient/family informed of bed offers received.  Patient chooses bed at  [facility]     Physician recommends and patient chooses bed at      Patient to be transferred to  [facility]) on 07/04/16.  Patient to be transferred to facility by [facility]     Patient family notified on 07/04/16 of transfer.  Name of family member notified:  Mom, Ms. Durene CalHunter     PHYSICIAN Please prepare priority discharge  summary, including medications, Please prepare prescriptions, Please sign FL2     Additional Comment:    _______________________________________________ Tresa MoorePatricia V Amarilys Lyles, LCSW 07/04/2016, 10:54 AM

## 2016-07-04 NOTE — Clinical Social Work Note (Signed)
Clinical Social Work Assessment  Patient Details  Name: Bob Reyes MRN: [id number] Date of Birth: 07/31/1980  Date of referral:  07/01/16               Reason for consult:  Facility Placement                Permission sought to share information with:  Facility Art therapist granted to share information::  Yes, Verbal Permission Granted  Name::        Agency::  SNF  Relationship::     Contact Information:     Housing/Transportation Living arrangements for the past 2 months:  Apartment Source of Information:  Patient Patient Interpreter Needed:  None Criminal Activity/Legal Involvement Pertinent to Current Situation/Hospitalization:  No - Comment as needed Significant Relationships:  Other Family Members, Parents, Friend Lives with:  Self Do you feel safe going back to the place where you live?  No Need for family participation in patient care:  No (Coment)  Care giving concerns:   Patient resides alone and has no one to care for him at home. Patient will need SNF.  Social Worker assessment / plan:  CSW met with patient at bedside along with family member to discuss DC recommendations from clinical team. Patient in agreement and CSW explained role and SNF options and placement. CSW obtained permission to send out offers to local area for SNF placement. CSW will f/u.  Employment status:  Unemployed, Disabled (Comment on whether or not currently receiving Disability) Insurance information:  Medicaid [place] PT Recommendations:  [place] / Referral to community resources:  [place]  Patient/Family's Response to care:  Patient appreciative of CSW assistance with SNF placement. No issues reported at this time.  Patient/Family's Understanding of and Emotional Response to Diagnosis, Current Treatment, and Prognosis:  Patient has good understanding of diagnosis, current treatment and prognosis and hopeful that short term  rehabilitation will be effective with addressing impairment. No issues or concerns at this time.  Emotional Assessment Appearance:  Appears stated age Attitude/Demeanor/Rapport:   (Cooperative) Affect (typically observed):  Accepting, Appropriate Orientation:  Oriented to Self, Oriented to Place, Oriented to  Time, Oriented to Situation Alcohol / Substance use:  Not Applicable Psych involvement (Current and /or in the community):  No (Comment)  Discharge Needs  Concerns to be addressed:  Care Coordination Readmission within the last 30 days:  No Current discharge risk:  Physical Impairment, Dependent with Mobility Barriers to Discharge:  No Barriers Identified   Normajean Baxter, LCSW 07/04/2016, 10:49 AM

## 2016-07-04 NOTE — NC FL2 (Signed)
[place] MEDICAID FL2 LEVEL OF CARE SCREENING TOOL     IDENTIFICATION  Patient Name: Bob Reyes Birthdate: Jul 04, 1980 Sex: male Admission Date (Current Location): 06/27/2016  [facility] and [place] Number:  Producer, television/film/video and Address:  [facility], [address] [place], [place] [address]      Provider Number: [id number]  Attending Physician Name and Address:  Burns Spain, MD  Relative Name and Phone Number:       Current Level of Care: Hospital Recommended Level of Care: Skilled Nursing Facility Prior Approval Number:    Date Approved/Denied:   PASRR Number:  pending  Discharge Plan: SNF    Current Diagnoses: Patient Active Problem List   Diagnosis Date Noted  . Closed fracture of bone of right foot   . Closed fracture of bone of left foot   . Paranoid schizophrenia, chronic condition (HCC) 06/28/2016  . Bilateral calcaneal fractures 06/27/2016    Orientation RESPIRATION BLADDER Height & Weight     Self, Time, Situation, Place  Normal Continent Weight: 225 lb (102.1 kg) Height:  6\' 5"  (195.6 cm)  BEHAVIORAL SYMPTOMS/MOOD NEUROLOGICAL BOWEL NUTRITION STATUS      Continent    AMBULATORY STATUS COMMUNICATION OF NEEDS Skin   Limited Assist Verbally Surgical wounds                       Personal Care Assistance Level of Assistance  Bathing, Dressing Bathing Assistance: Limited assistance   Dressing Assistance: Limited assistance     Functional Limitations Info             SPECIAL CARE FACTORS FREQUENCY  PT (By licensed PT), OT (By licensed OT)     PT Frequency: 5x/wk OT Frequency: 5x/wk            Contractures      Additional Factors Info  Code Status, Allergies, Psychotropic Code Status Info: full Allergies Info: nka Psychotropic Info: Zyprexa 10 mg         Current Medications (07/04/2016):  This is the current hospital active medication list Current Facility-Administered Medications   Medication Dose Route Frequency Provider Last Rate Last Dose  . acetaminophen (TYLENOL) tablet 650 mg  650 mg Oral Q6H PRN Naida Sleight, PA-C   650 mg at 07/03/16 1820   Or  . acetaminophen (TYLENOL) suppository 650 mg  650 mg Rectal Q6H PRN Naida Sleight, PA-C      . aspirin tablet 325 mg  325 mg Oral Daily Naida Sleight, PA-C   325 mg at 07/04/16 8119  . Chlorhexidine Gluconate Cloth 2 % PADS 6 each  6 each Topical Daily Burns Spain, MD   6 each at 07/04/16 [phone]  . HYDROmorphone (DILAUDID) injection 0.25-0.5 mg  0.25-0.5 mg Intravenous Q5 min PRN Ellender, Catheryn Bacon, MD      . HYDROmorphone (DILAUDID) injection 0.5 mg  0.5 mg Intravenous Q3H PRN Naida Sleight, PA-C      . lactated ringers infusion   Intravenous Continuous Ellender, Catheryn Bacon, MD   Stopped at 07/01/16 1456  . metoCLOPramide (REGLAN) tablet 5-10 mg  5-10 mg Oral Q8H PRN Naida Sleight, PA-C       Or  . metoCLOPramide (REGLAN) injection 5-10 mg  5-10 mg Intravenous Q8H PRN Zonia Kief M, PA-C      . mupirocin ointment (BACTROBAN) 2 % 1 application  1 application Nasal BID Burns Spain, MD  1 application at 07/04/16 0929  . OLANZapine zydis (ZYPREXA) disintegrating tablet 10 mg  10 mg Oral q morning - 10a John Giovanniathore, Vasundhra, MD   10 mg at 07/04/16 0927  . ondansetron (ZOFRAN) tablet 4 mg  4 mg Oral Q6H PRN Naida Sleightwens, James M, PA-C       Or  . ondansetron [facility] [place](ZOFRAN) injection 4 mg  4 mg Intravenous Q6H PRN Naida Sleightwens, James M, PA-C      . oxyCODONE (Oxy IR/ROXICODONE) immediate release tablet 5 mg  5 mg Oral Q4H PRN Naida Sleightwens, James M, PA-C   5 mg at 07/04/16 [id number]  . oxyCODONE (Oxy IR/ROXICODONE) immediate release tablet 5 mg  5 mg Oral Once PRN Ellender, Catheryn Baconyan P, MD       Or  . oxyCODONE (ROXICODONE) 5 MG/5ML solution 5 mg  5 mg Oral Once PRN Ellender, Catheryn Baconyan P, MD      . polyethylene glycol (MIRALAX / GLYCOLAX) packet 17 g  17 g Oral Daily John Giovanniathore, Vasundhra, MD   17 g at 07/04/16 0926  . promethazine (PHENERGAN) injection  6.25-12.5 mg  6.25-12.5 mg Intravenous Q15 min PRN Ellender, Catheryn Baconyan P, MD      . senna-docusate (Senokot-S) tablet 2 tablet  2 tablet Oral BID John Giovanniathore, Vasundhra, MD   2 tablet at 07/04/16 [phone]  . sodium chloride 0.45 % 1,000 mL with potassium chloride 40 mEq infusion   Intravenous Continuous Carolynn CommentStrelow, Bryan, MD 75 mL/hr at 07/04/16 0250       Discharge Medications: Please see discharge summary for a list of discharge medications.  Relevant Imaging Results:  Relevant Lab Results:   Additional Information SS#: [id number]  Tresa MoorePatricia V Nakyia Dau, LCSW

## 2016-07-04 NOTE — Plan of Care (Signed)
Problem: Safety: Goal: Ability to remain free from injury will improve Outcome: Progressing No incidence of falls during this admission. Call bell within reach. Bed in low and locked position. Patient alert and oriented. Clean and clear environment maintained. Patient NWB to bilateral legs. 3/4 siderails in place. Patient verbalized understanding of safety instruction.  Problem: Pain Management: Goal: Pain level will decrease Outcome: Progressing Pain being controlled with PO pain medication at this time. Vital signs are stable. Patient resting well.

## 2016-07-04 NOTE — Progress Notes (Addendum)
Subjective: Currently, the patient is well. No pain. BM yesterday. Ready for DC  Objective: Vital signs in last 24 hours: Vitals:   07/03/16 0615 07/03/16 1500 07/03/16 1937 07/04/16 0409  BP: 130/78 135/70 129/77 131/71  Pulse: 84 94 90 90  Resp:  16    Temp: 98.2 F (36.8 C) 98.4 F (36.9 C) 98 F (36.7 C) 98.4 F (36.9 C)  TempSrc: Oral Oral Oral Oral  SpO2: 98% 98% 95% 99%  Weight:      Height:       Physical Exam: Physical Exam  Constitutional: He is oriented to person, place, and time. He appears well-developed and well-nourished. He is cooperative. No distress.  HENT:  Head: Normocephalic and atraumatic.  Cardiovascular: Normal rate, regular rhythm, S1 normal, S2 normal and intact distal pulses.  Exam reveals no gallop.   No murmur heard. Pulmonary/Chest: Effort normal and breath sounds normal. No respiratory distress. He has no wheezes. He has no rhonchi. He has no rales. He exhibits no tenderness.  Abdominal: Soft. Normal appearance and bowel sounds are normal. He exhibits no ascites. There is no hepatosplenomegaly. There is no tenderness.  Musculoskeletal:  Bilateral lower extremities in splint and wrap.   Neurological: He is alert and oriented to person, place, and time. He has normal strength.  Skin: Skin is warm, dry and intact. He is not diaphoretic.  Psychiatric: His behavior is normal.   Labs: CBC:  Recent Labs Lab 06/27/16 1837 06/27/16 2106  WBC 18.2*  --   NEUTROABS  --  12.7*  HGB 16.1  --   HCT 45.3  --   MCV 83.1  --   PLT 271  --    Metabolic Panel:  Recent Labs Lab 06/27/16 1837 06/29/16 0654 06/30/16 0546 07/01/16 0432 07/02/16 0539  NA 139  --  136 137 138  K 3.6  --  3.5 3.4* 3.7  CL 103  --  98* 102 103  CO2 22  --  26 26 26   GLUCOSE 77  --  119* 110* 119*  BUN 12  --  11 15 13   CREATININE 1.22  --  1.14 1.09 1.01  CALCIUM 9.7  --  9.0 8.6* 8.6*  ALT  --  31  --   --   --   ALKPHOS  --  70  --   --   --   BILITOT  --   1.7*  --   --   --   PROT  --  7.0  --   --   --   ALBUMIN  --  4.0  --   --   --     Medications: Scheduled Medications: . aspirin  325 mg Oral Daily  . Chlorhexidine Gluconate Cloth  6 each Topical Daily  . mupirocin ointment  1 application Nasal BID  . OLANZapine zydis  10 mg Oral q morning - 10a  . polyethylene glycol  17 g Oral Daily  . senna-docusate  2 tablet Oral BID   PRN Medications: acetaminophen **OR** acetaminophen, HYDROmorphone (DILAUDID) injection, HYDROmorphone (DILAUDID) injection, metoCLOPramide **OR** metoCLOPramide (REGLAN) injection, ondansetron **OR** ondansetron (ZOFRAN) IV, oxyCODONE, oxyCODONE **OR** oxyCODONE, promethazine  Assessment/Plan: [name] is a 36 y.o. male with schizophrenia who presents with bilateral calcaneal fractures after jumping off a balcony.  1) Schizophrenia: Stable. - continue Zyprexa 10mg   2) Bilateral closed, comminuted calcaneal fx: s/p ORIF x2. DC to SNF today. BM yesterday. Pain controlled. - Miralax daily -  PT/OT  Length of Stay: 7 day(s) Dispo: Anticipated discharge to SNF today.  Carolynn CommentStrelow, Aurthur Wingerter, MD Pager: [phone] (7AM-5PM) 07/04/2016, 8:56 AM

## 2016-07-05 ENCOUNTER — Encounter: Payer: Self-pay | Admitting: Adult Health

## 2016-07-05 ENCOUNTER — Non-Acute Institutional Stay (SKILLED_NURSING_FACILITY): Payer: Medicaid Other | Admitting: Adult Health

## 2016-07-05 DIAGNOSIS — S92902S Unspecified fracture of left foot, sequela: Secondary | ICD-10-CM | POA: Diagnosis not present

## 2016-07-05 DIAGNOSIS — K5903 Drug induced constipation: Secondary | ICD-10-CM

## 2016-07-05 DIAGNOSIS — T402X5A Adverse effect of other opioids, initial encounter: Secondary | ICD-10-CM | POA: Diagnosis not present

## 2016-07-05 DIAGNOSIS — S92901S Unspecified fracture of right foot, sequela: Secondary | ICD-10-CM | POA: Diagnosis not present

## 2016-07-05 DIAGNOSIS — F200 Paranoid schizophrenia: Secondary | ICD-10-CM | POA: Diagnosis not present

## 2016-07-05 HISTORY — DX: Drug induced constipation: K59.03

## 2016-07-05 HISTORY — DX: Adverse effect of other opioids, initial encounter: T40.2X5A

## 2016-07-05 NOTE — Progress Notes (Signed)
Location:   [facility] Room Number: 120 B Place of Service:  SNF (31)   CODE STATUS: Full Code  No Known Allergies  Chief Complaint  Patient presents with  . Hospitalization Follow-up    Hospital follow up    HPI:  He has a history of schizophrenia; jumped off the second story balcony at his residence. He told hospital staff at that time he felt euphoric and attempted to get to his friend. He had an impulse to jump off the balcony to get there faster.  He suffered bilateral calcaneal fractures. He is non-weight bearing for 8 weeks. He is for short term rehab with his goal to return back home.   Past Medical History:  Diagnosis Date  . Closed fracture of bone of left foot   . Closed fracture of bone of right foot   . Paranoid schizophrenia, chronic condition (HCC) 06/28/2016    Past Surgical History:  Procedure Laterality Date  . ORIF CALCANEOUS FRACTURE Left 06/29/2016   Procedure: OPEN REDUCTION INTERNAL FIXATION (ORIF) CALCANEOUS FRACTURE;  Surgeon: Eldred Manges, MD;  Location: [facility] OR;  Service: Orthopedics;  Laterality: Left;  . ORIF CALCANEOUS FRACTURE Right 07/01/2016   Procedure: OPEN REDUCTION INTERNAL FIXATION (ORIF) RIGHT CALCANEOUS FRACTURE;  Surgeon: Eldred Manges, MD;  Location: [facility] OR;  Service: Orthopedics;  Laterality: Right;    Social History   Social History  . Marital status: Single    Spouse name: N/A  . Number of children: N/A  . Years of education: N/A   Occupational History  . Not on file.   Social History Main Topics  . Smoking status: Never Smoker  . Smokeless tobacco: Never Used  . Alcohol use No  . Drug use: No  . Sexual activity: Not on file   Other Topics Concern  . Not on file   Social History Narrative  . No narrative on file   No family history on file.    VITAL SIGNS BP 112/80   Pulse 95   Temp 100.2 F (37.9 C)   Resp 12   Ht 6\' 5"  (1.956 m)   Wt 219 lb 1.6 oz (99.4 kg)   SpO2 100%   BMI 25.98 kg/m    Patient's Medications  New Prescriptions   No medications on file  Previous Medications   DOCUSATE SODIUM (COLACE) 50 MG CAPSULE    Take 2 capsules (100 mg) by mouth two times daily for Bowel Management   OLANZAPINE ZYDIS (ZYPREXA) 10 MG DISINTEGRATING TABLET    Take 10 mg by mouth every morning.   OXYCODONE (OXY IR/ROXICODONE) 5 MG IMMEDIATE RELEASE TABLET    Take 1 tablet (5 mg total) by mouth every 4 (four) hours as needed for breakthrough pain.   POLYETHYLENE GLYCOL (MIRALAX / GLYCOLAX) PACKET    Take 17 g by mouth daily as needed for mild constipation.   RIVAROXABAN (XARELTO) 10 MG TABS TABLET    Take 1 tablet (10 mg total) by mouth daily.  Modified Medications   No medications on file  Discontinued Medications   DOCUSATE (COLACE) 50 MG/5ML LIQUID    Take 10 mLs (100 mg total) by mouth 2 (two) times daily.     SIGNIFICANT DIAGNOSTIC EXAMS  06-27-16: left foot x-ray: Comminuted calcaneal fracture involving the subtalar joint with soft tissue swelling.   06-27-16: right foot x-ray: Comminuted fracture of the calcaneus not well visualized on this exam. Recommend follow-up CT for further characterization of the fracture  06-27-16:  right tibia/fibula x-ray: 1.  No acute osseous injury of the right tibia and fibula. 2. Severely comminuted fracture of the anterior, mid and posterior calcaneus.   06-27-16: left tibia/fibula x-ray: 1. Complex fracture of the calcaneus.  Recommend CT of the hindfoot. 2. No tibial fracture.  06-27-16: ct of bilateral feet: 1. Acute, closed, comminuted Sanders type 3 AC fracture the calcaneus with intra-articular involvement of the subtalar and calcaneocuboid articulations. Approximately 5 mm depression is noted of the central calcaneal fracture fragment. 2. Acute, closed, comminuted Sanders type 3 AB fracture of the calcaneus with intra-articular extension into the subtalar and calcaneocuboid articulations and also demonstrating 5 mm of depression along the  medial aspect of the main central calcaneal fracture fragment.  LABS REVIEWED:   06-27-16: wbc 18.2; hgb 16.1; hct 45.3; mcv 83.1; plt 271; glucose 77; bun 12; creat 1.22; k+ 3.6; na++ 139; ca 9.7; HIV: nr 06-29-16: total bili 1.6; indirect bili: 1.5; albumin 4.0; chol 159; ldl 90; trig 179; hdl 33 07-02-16: glucose 119; bun 13; creat 1.01; k+ 3.7; na++ 138; ca 8.6   Review of Systems  Constitutional: Negative for malaise/fatigue.  Respiratory: Negative for cough and shortness of breath.   Cardiovascular: Negative for chest pain, palpitations and leg swelling.  Gastrointestinal: Negative for abdominal pain, constipation and heartburn.  Musculoskeletal: Negative for back pain, joint pain and myalgias.       Has bilateral heel fractures; has ace cast present   Skin: Negative.   Neurological: Negative for dizziness.  Psychiatric/Behavioral: The patient is not nervous/anxious.     Physical Exam  Constitutional: He is oriented to person, place, and time. No distress.  Eyes: Conjunctivae are normal.  Neck: Neck supple. No JVD present. No thyromegaly present.  Cardiovascular: Normal rate, regular rhythm and intact distal pulses.   Respiratory: Effort normal and breath sounds normal. No respiratory distress. He has no wheezes.  GI: Soft. Bowel sounds are normal. He exhibits no distension. There is no tenderness.  Musculoskeletal: He exhibits no edema.  Able to move all extremities  Bilateral heel fractures in ace wrap splint Small amount of serous drainage on right heel  Lymphadenopathy:    He has no cervical adenopathy.  Neurological: He is alert and oriented to person, place, and time.  Skin: Skin is warm and dry. He is not diaphoretic.  Has numerous tattoos on face and bilateral arms   Psychiatric: He has a normal mood and affect. His behavior is normal.      ASSESSMENT/ PLAN:  1. Paranoid schizophrenia; chronic condition: he is stable; is on long ter zyprex 10 mg daily will  monitor  2. Constipation: will continue colace twice daily and miralax daily as needed  3. Bilateral calcaneal fractures: is non-weight bearing for 8 weeks; will continue therapy as directed; will follow up with orthopedics; has oxycodone 5 mg every 4 hours as needed; will xarelto 10 mg daily for 56 days post discharge from hospital. He is running a low grade temp. His wbc was elevated in the hospital; more than likely this does represent an inflammatory response.   Will check cbc   Time spent with patient  50  minutes >50% time spent counseling; reviewing medical record; tests; labs; and developing future plan of care   MD is aware of resident's narcotic use and is in agreement with current plan of care. We will attempt to wean resident as apropriate   Synthia Innocenteborah Maneh Sieben NP [facility]  Contact [phone] Monday through Friday 8am-  5pm  After hours call [phone]

## 2016-07-06 LAB — CBC AND DIFFERENTIAL
HEMATOCRIT: 42 % (ref 41–53)
Hemoglobin: 14.4 g/dL (ref 13.5–17.5)
NEUTROS ABS: 10 /uL
Platelets: 366 10*3/uL (ref 150–399)
WBC: 14.5 10^3/mL

## 2016-07-07 ENCOUNTER — Encounter: Payer: Self-pay | Admitting: Internal Medicine

## 2016-07-07 ENCOUNTER — Non-Acute Institutional Stay (SKILLED_NURSING_FACILITY): Payer: Medicaid Other | Admitting: Internal Medicine

## 2016-07-07 DIAGNOSIS — Z8781 Personal history of (healed) traumatic fracture: Secondary | ICD-10-CM | POA: Diagnosis not present

## 2016-07-07 DIAGNOSIS — Z967 Presence of other bone and tendon implants: Secondary | ICD-10-CM | POA: Diagnosis not present

## 2016-07-07 DIAGNOSIS — R112 Nausea with vomiting, unspecified: Secondary | ICD-10-CM | POA: Diagnosis not present

## 2016-07-07 DIAGNOSIS — R197 Diarrhea, unspecified: Secondary | ICD-10-CM | POA: Diagnosis not present

## 2016-07-07 DIAGNOSIS — Z9889 Other specified postprocedural states: Secondary | ICD-10-CM

## 2016-07-07 DIAGNOSIS — T402X5A Adverse effect of other opioids, initial encounter: Secondary | ICD-10-CM | POA: Diagnosis not present

## 2016-07-07 DIAGNOSIS — K5903 Drug induced constipation: Secondary | ICD-10-CM

## 2016-07-07 DIAGNOSIS — F200 Paranoid schizophrenia: Secondary | ICD-10-CM

## 2016-07-07 NOTE — Progress Notes (Signed)
Patient ID: Bob Reyes, male   DOB: [birth year], 36 y.o.   MRN: [id number]    HISTORY AND PHYSICAL   DATE: 07/07/2016  Location:    [facility] Room Number: 125 B Place of Service: SNF (31)   Extended Emergency Contact Information Primary Emergency Contact: Hunter,Tia Address: [address]          [place] [address] [place] of [place] Phone: [phone] Relation: Mother  Advanced Directive information Does Patient Have a Medical Advance Directive?: No, Would patient like information on creating a medical advance directive?: No - Patient declined  Chief Complaint  Patient presents with  . New Admit To SNF    Admission    HPI:  36 yo male seen today as a new admission into SNF following hospital stay for b/l calcaneal fx, paranoid schizophrenia. He underwent left ORIF of calcaneus with lateral calcaneal plate placement on 9/03ES followed by right ORIF with lateral plate on 9/23RA. Hgb 16.1; K 3.7; LDL 90; UDS neg; Etoh <5. He presents to SNF for short term rehab.  Today he reports episode of N/V after taking colace this AM. No nausea at this time. He has not taken any oxy and denies pain at this time. He prefers to not take any pain med. Appetite good. Sleeps well. He is NWB at this time. He is scheduled to f/u with Ortho Dr Lorin Mercy in 2 weeks.  Paranoid schizophrenia - mood stable on long term zyprexa 10 mg daily  Constipation - well controlled and now with loose stools on colace twice daily and miralax daily as needed   Past Medical History:  Diagnosis Date  . Closed fracture of bone of left foot   . Closed fracture of bone of right foot   . Paranoid schizophrenia, chronic condition ([place]) 06/28/2016    Past Surgical History:  Procedure Laterality Date  . ORIF CALCANEOUS FRACTURE Left 06/29/2016   Procedure: OPEN REDUCTION INTERNAL FIXATION (ORIF) CALCANEOUS FRACTURE;  Surgeon: Marybelle Killings, MD;  Location: [facility];  Service: Orthopedics;  Laterality:  Left;  . ORIF CALCANEOUS FRACTURE Right 07/01/2016   Procedure: OPEN REDUCTION INTERNAL FIXATION (ORIF) RIGHT CALCANEOUS FRACTURE;  Surgeon: Marybelle Killings, MD;  Location: [facility];  Service: Orthopedics;  Laterality: Right;    Patient Care Team: Patient, No Pcp Per as PCP - General (General Practice)  Social History   Social History  . Marital status: Single    Spouse name: N/A  . Number of children: N/A  . Years of education: N/A   Occupational History  . Not on file.   Social History Main Topics  . Smoking status: Never Smoker  . Smokeless tobacco: Never Used  . Alcohol use No  . Drug use: No  . Sexual activity: Not on file   Other Topics Concern  . Not on file   Social History Narrative  . No narrative on file     reports that he has never smoked. He has never used smokeless tobacco. He reports that he does not drink alcohol or use drugs.  History reviewed. No pertinent family history. No family status information on file.    Immunization History  Administered Date(s) Administered  . PPD Test 07/04/2016    No Known Allergies  Medications: Patient's Medications  New Prescriptions   No medications on file  Previous Medications   DOCUSATE SODIUM (COLACE) 50 MG CAPSULE    Take 2 capsules (100 mg) by mouth two times daily for Bowel  Management   OLANZAPINE ZYDIS (ZYPREXA) 10 MG DISINTEGRATING TABLET    Take 10 mg by mouth every morning.   OXYCODONE (OXY IR/ROXICODONE) 5 MG IMMEDIATE RELEASE TABLET    Take 1 tablet (5 mg total) by mouth every 4 (four) hours as needed for breakthrough pain.   POLYETHYLENE GLYCOL (MIRALAX / GLYCOLAX) PACKET    Take 17 g by mouth daily as needed for mild constipation.   RIVAROXABAN (XARELTO) 10 MG TABS TABLET    Take 1 tablet (10 mg total) by mouth daily.  Modified Medications   No medications on file  Discontinued Medications   No medications on file    Review of Systems  Unable to perform ROS: Psychiatric disorder    Vitals:     07/07/16 0907  BP: 122/80  Pulse: 95  Resp: 12  Temp: (!) 100.6 F (38.1 C)  TempSrc: Oral  SpO2: 100%  Weight: 219 lb 1.6 oz (99.4 kg)  Height: _0  (1.956 m)   Body mass index is 25.98 kg/m.  Physical Exam  Constitutional: He appears well-developed and well-nourished.  HENT:  Mouth/Throat: Oropharynx is clear and moist.  MMM; no oral thrush  Eyes: Pupils are equal, round, and reactive to light. No scleral icterus.  Neck: Neck supple. Carotid bruit is not present. No thyromegaly present.  Cardiovascular: Regular rhythm, normal heart sounds and intact distal pulses.  Tachycardia present.  Exam reveals no gallop and no friction rub.   No murmur heard. B/l LE soft cast intact with excellent capillary RF  Pulmonary/Chest: Effort normal and breath sounds normal. He has no wheezes. He has no rales. He exhibits no tenderness.  Abdominal: Soft. Bowel sounds are normal. He exhibits no distension, no abdominal bruit, no pulsatile midline mass and no mass. There is no hepatomegaly. There is no tenderness. There is no rebound and no guarding.  Musculoskeletal:  B/l LE soft cast intact with FROm toes  Lymphadenopathy:    He has no cervical adenopathy.  Neurological: He is alert.  Skin: Skin is warm and dry. No rash noted.  Multiple tattoos  Psychiatric: He has a normal mood and affect. His behavior is normal. Thought content normal.     Labs reviewed: Abstract on 07/06/2016  Component Date Value Ref Range Status  . Hemoglobin 07/06/2016 14.4  13.5 - 17.5 g/dL Final  . HCT 07/06/2016 42  41 - 53 % Final  . Neutrophils Absolute 07/06/2016 10  /L Final  . Platelets 07/06/2016 366  150 - 399 K/L Final  . WBC 07/06/2016 14.5  10^3/mL Final  Admission on 06/27/2016, Discharged on 07/04/2016  Component Date Value Ref Range Status  . WBC 06/27/2016 18.2* 4.0 - 10.5 K/uL Final  . RBC 06/27/2016 5.45  4.22 - 5.81 MIL/uL Final  . Hemoglobin 06/27/2016 16.1  13.0 - 17.0 g/dL Final  .  HCT 06/27/2016 45.3  39.0 - 52.0 % Final  . MCV 06/27/2016 83.1  78.0 - 100.0 fL Final  . MCH 06/27/2016 29.5  26.0 - 34.0 pg Final  . MCHC 06/27/2016 35.5  30.0 - 36.0 g/dL Final  . RDW 06/27/2016 13.0  11.5 - 15.5 % Final  . Platelets 06/27/2016 271  150 - 400 K/uL Final  . Sodium 06/27/2016 139  135 - 145 mmol/L Final  . Potassium 06/27/2016 3.6  3.5 - 5.1 mmol/L Final  . Chloride 06/27/2016 103  101 - 111 mmol/L Final  . CO2 06/27/2016 22  22 - 32 mmol/L Final  . Glucose,  Bld 06/27/2016 77  65 - 99 mg/dL Final  . BUN 06/27/2016 12  6 - 20 mg/dL Final  . Creatinine, Ser 06/27/2016 1.22  0.61 - 1.24 mg/dL Final  . Calcium 06/27/2016 9.7  8.9 - 10.3 mg/dL Final  . GFR calc non Af Amer 06/27/2016 >60  >60 mL/min Final  . GFR calc Af Amer 06/27/2016 >60  >60 mL/min Final   Comment: (NOTE) The eGFR has been calculated using the CKD EPI equation. This calculation has not been validated in all clinical situations. eGFR's persistently <60 mL/min signify possible Chronic Kidney Disease.   . Anion gap 06/27/2016 14  5 - 15 Final  . Opiates 06/28/2016 NONE DETECTED  NONE DETECTED Final  . Cocaine 06/28/2016 NONE DETECTED  NONE DETECTED Final  . Benzodiazepines 06/28/2016 NONE DETECTED  NONE DETECTED Final  . Amphetamines 06/28/2016 NONE DETECTED  NONE DETECTED Final  . Tetrahydrocannabinol 06/28/2016 NONE DETECTED  NONE DETECTED Final  . Barbiturates 06/28/2016 NONE DETECTED  NONE DETECTED Final   Comment:        DRUG SCREEN FOR MEDICAL PURPOSES ONLY.  IF CONFIRMATION IS NEEDED FOR ANY PURPOSE, NOTIFY LAB WITHIN 5 DAYS.        LOWEST DETECTABLE LIMITS FOR URINE DRUG SCREEN Drug Class       Cutoff (ng/mL) Amphetamine      1000 Barbiturate      200 Benzodiazepine   353 Tricyclics       299 Opiates          300 Cocaine          300 THC              50   . HIV Screen 4th Generation wRfx 06/27/2016 Non Reactive  Non Reactive Final   Comment: (NOTE) Performed At: [facility] [place], [place] [id number] Lindon Romp MD QQ:[id number]   . Neutrophils Relative % 06/27/2016 82  % Final  . Neutro Abs 06/27/2016 12.7* 1.7 - 7.7 K/uL Final  . Lymphocytes Relative 06/27/2016 13  % Final  . Lymphs Abs 06/27/2016 2.0  0.7 - 4.0 K/uL Final  . Monocytes Relative 06/27/2016 5  % Final  . Monocytes Absolute 06/27/2016 0.7  0.1 - 1.0 K/uL Final  . Eosinophils Relative 06/27/2016 0  % Final  . Eosinophils Absolute 06/27/2016 0.0  0.0 - 0.7 K/uL Final  . Basophils Relative 06/27/2016 0  % Final  . Basophils Absolute 06/27/2016 0.0  0.0 - 0.1 K/uL Final  . Alcohol, Ethyl (B) 06/27/2016 <5  <5 mg/dL Final   Comment:        LOWEST DETECTABLE LIMIT FOR SERUM ALCOHOL IS 5 mg/dL FOR MEDICAL PURPOSES ONLY   . Specimen Description 06/27/2016 BLOOD LEFT ANTECUBITAL   Final  . Special Requests 06/27/2016 BOTTLES DRAWN AEROBIC AND ANAEROBIC Blood Culture adequate volume   Final  . Culture 06/27/2016 NO GROWTH 5 DAYS   Final  . Report Status 06/27/2016 07/02/2016 FINAL   Final  . Specimen Description 06/27/2016 BLOOD RIGHT HAND   Final  . Special Requests 06/27/2016 BOTTLES DRAWN AEROBIC ONLY Blood Culture adequate volume   Final  . Culture 06/27/2016 NO GROWTH 5 DAYS   Final  . Report Status 06/27/2016 07/02/2016 FINAL   Final  . HCV Ab 06/28/2016 <0.1  0.0 - 0.9 s/co ratio Final   Comment: (NOTE) Performed At: [facility] [place], [place] [id number] Lindon Romp MD GY:[id number]   . Prolactin  06/29/2016 23.5* 4.0 - 15.2 ng/mL Final   Comment: (NOTE) Performed At: [facility] [place], [place] [id number] Lindon Romp MD QB:[id number]   . Total Protein 06/29/2016 7.0  6.5 - 8.1 g/dL Final  . Albumin 06/29/2016 4.0  3.5 - 5.0 g/dL Final  . AST 06/29/2016 25  15 - 41 U/L Final  . ALT 06/29/2016 31  17 - 63 U/L Final  . Alkaline Phosphatase 06/29/2016 70  38 - 126 U/L Final  . Total Bilirubin  06/29/2016 1.7* 0.3 - 1.2 mg/dL Final  . Bilirubin, Direct 06/29/2016 0.2  0.1 - 0.5 mg/dL Final  . Indirect Bilirubin 06/29/2016 1.5* 0.3 - 0.9 mg/dL Final  . Cholesterol 06/29/2016 159  0 - 200 mg/dL Final  . Triglycerides 06/29/2016 179* <150 mg/dL Final  . HDL 06/29/2016 33* >40 mg/dL Final  . Total CHOL/HDL Ratio 06/29/2016 4.8  RATIO Final  . VLDL 06/29/2016 36  0 - 40 mg/dL Final  . LDL Cholesterol 06/29/2016 90  0 - 99 mg/dL Final   Comment:        Total Cholesterol/HDL:CHD Risk Coronary Heart Disease Risk Table                     Men   Women  1/2 Average Risk   3.4   3.3  Average Risk       5.0   4.4  2 X Average Risk   9.6   7.1  3 X Average Risk  23.4   11.0        Use the calculated Patient Ratio above and the CHD Risk Table to determine the patient's CHD Risk.        ATP III CLASSIFICATION (LDL):  <100     mg/dL   Optimal  100-129  mg/dL   Near or Above                    Optimal  130-159  mg/dL   Borderline  160-189  mg/dL   High  >190     mg/dL   Very High   . Comment: 06/28/2016 Comment   Final   Comment: (NOTE) Non reactive HCV antibody screen is consistent with no HCV infection, unless recent infection is suspected or other evidence exists to indicate HCV infection. Performed At: [facility] [place], [place] [id number] Lindon Romp MD ME:[id number]   . MRSA, PCR 06/29/2016 NEGATIVE  NEGATIVE Final  . Staphylococcus aureus 06/29/2016 POSITIVE* NEGATIVE Final   Comment:        The Xpert SA Assay (FDA approved for NASAL specimens in patients over 16 years of age), is one component of a comprehensive surveillance program.  Test performance has been validated by [facility] for patients greater than or equal to 29 year old. It is not intended to diagnose infection nor to guide or monitor treatment.   . Sodium 06/30/2016 136  135 - 145 mmol/L Final  . Potassium 06/30/2016 3.5  3.5 - 5.1 mmol/L Final  . Chloride  06/30/2016 98* 101 - 111 mmol/L Final  . CO2 06/30/2016 26  22 - 32 mmol/L Final  . Glucose, Bld 06/30/2016 119* 65 - 99 mg/dL Final  . BUN 06/30/2016 11  6 - 20 mg/dL Final  . Creatinine, Ser 06/30/2016 1.14  0.61 - 1.24 mg/dL Final  . Calcium 06/30/2016 9.0  8.9 - 10.3 mg/dL Final  . GFR calc non Af Amer 06/30/2016 >  60  >60 mL/min Final  . GFR calc Af Amer 06/30/2016 >60  >60 mL/min Final   Comment: (NOTE) The eGFR has been calculated using the CKD EPI equation. This calculation has not been validated in all clinical situations. eGFR's persistently <60 mL/min signify possible Chronic Kidney Disease.   . Anion gap 06/30/2016 12  5 - 15 Final  . Sodium 07/01/2016 137  135 - 145 mmol/L Final  . Potassium 07/01/2016 3.4* 3.5 - 5.1 mmol/L Final  . Chloride 07/01/2016 102  101 - 111 mmol/L Final  . CO2 07/01/2016 26  22 - 32 mmol/L Final  . Glucose, Bld 07/01/2016 110* 65 - 99 mg/dL Final  . BUN 07/01/2016 15  6 - 20 mg/dL Final  . Creatinine, Ser 07/01/2016 1.09  0.61 - 1.24 mg/dL Final  . Calcium 07/01/2016 8.6* 8.9 - 10.3 mg/dL Final  . GFR calc non Af Amer 07/01/2016 >60  >60 mL/min Final  . GFR calc Af Amer 07/01/2016 >60  >60 mL/min Final   Comment: (NOTE) The eGFR has been calculated using the CKD EPI equation. This calculation has not been validated in all clinical situations. eGFR's persistently <60 mL/min signify possible Chronic Kidney Disease.   . Anion gap 07/01/2016 9  5 - 15 Final  . Sodium 07/02/2016 138  135 - 145 mmol/L Final  . Potassium 07/02/2016 3.7  3.5 - 5.1 mmol/L Final  . Chloride 07/02/2016 103  101 - 111 mmol/L Final  . CO2 07/02/2016 26  22 - 32 mmol/L Final  . Glucose, Bld 07/02/2016 119* 65 - 99 mg/dL Final  . BUN 07/02/2016 13  6 - 20 mg/dL Final  . Creatinine, Ser 07/02/2016 1.01  0.61 - 1.24 mg/dL Final  . Calcium 07/02/2016 8.6* 8.9 - 10.3 mg/dL Final  . GFR calc non Af Amer 07/02/2016 >60  >60 mL/min Final  . GFR calc Af Amer 07/02/2016 >60   >60 mL/min Final   Comment: (NOTE) The eGFR has been calculated using the CKD EPI equation. This calculation has not been validated in all clinical situations. eGFR's persistently <60 mL/min signify possible Chronic Kidney Disease.   . Anion gap 07/02/2016 9  5 - 15 Final    Dg Lumbar Spine Complete  Result Date: 06/27/2016 CLINICAL DATA:  Jumped from a 9 foot balcony. Lower extremity numbness and pain. EXAM: LUMBAR SPINE - COMPLETE 4+ VIEW COMPARISON:  None. FINDINGS: Five non rib-bearing lumbar-type vertebral bodies are intact and aligned with maintenance of the lumbar lordosis. Intervertebral disc heights are normal. No destructive bony lesions. Sacroiliac joints are symmetric. Included prevertebral and paraspinal soft tissue planes are non-suspicious. IMPRESSION: Negative. Electronically Signed   By: Elon Alas M.D.   On: 06/27/2016 17:49   Dg Tibia/fibula Left  Result Date: 06/27/2016 CLINICAL DATA:  Patient reports jumping from a 100f balcony, reports immediately after his left and right tib/fib were numb, reports now his pain is distal to both of his tib/fibs. EXAM: LEFT TIBIA AND FIBULA - 2 VIEW COMPARISON:  None. FINDINGS: No fracture of the tibia or fibula. Knee joint and ankle joint appear normal on two views. On the lateral view there is a fracture of the calcaneus which is complex involving the posterior calcaneus as well as the neck of the calcaneus with depression of the subtalar surface. IMPRESSION: 1. Complex fracture of the calcaneus.  Recommend CT of the hindfoot. 2. No tibial fracture. Electronically Signed   By: SSuzy BouchardM.D.   On: 06/27/2016 16:13  Dg Tibia/fibula Right  Result Date: 06/27/2016 CLINICAL DATA:  Patient reports jumping from a 47f balcony, reports immediately after his left and right tib/fib were numb, reports now his pain is distal to both of his tib/fibs. EXAM: RIGHT TIBIA AND FIBULA - 2 VIEW COMPARISON:  None. FINDINGS: No acute fracture or  dislocation of the right tibia or fibula. No knee joint effusion. Severely comminuted fracture of the anterior, mid and posterior right calcaneus. Relative widening of the posterior subtalar joint. IMPRESSION: 1.  No acute osseous injury of the right tibia and fibula. 2. Severely comminuted fracture of the anterior, mid and posterior calcaneus. Electronically Signed   By: HKathreen Devoid  On: 06/27/2016 16:09   Dg Os Calcis Left  Result Date: 06/29/2016 CLINICAL DATA:  ORIF left calcaneus. EXAM: LEFT OS CALCIS - 2+ VIEW COMPARISON:  CT 06/27/2016.  Left foot 06/27/2016. FINDINGS: Plate and screw fixation of left calcaneal fracture with near anatomic alignment. Hardware intact. Two images obtained. 0 minutes 43 seconds fluoroscopy time . IMPRESSION: ORIF left calcaneal fracture. Electronically Signed   By: TMarcello Moores Register   On: 06/29/2016 16:28   Dg Os Calcis Right  Result Date: 07/01/2016 CLINICAL DATA:  Calcaneal fracture EXAM: RIGHT OS CALCIS - 2+ VIEW; DG C-ARM 61-120 MIN COMPARISON:  06/29/2016 FINDINGS: Patient is status post ORIF of the comminuted calcaneal fracture. Fracture lines remain visible. Stable alignment. No significant interval change. IMPRESSION: Stable ORIF of the left calcaneal fracture. Electronically Signed   By: MJerilynn Mages  Shick M.D.   On: 07/01/2016 12:12   Ct Foot Left Wo Contrast  Result Date: 06/27/2016 CLINICAL DATA:  Bilateral foot pain after jumping out from balcony. EXAM: CT OF THE LEFT FOOT WITHOUT CONTRAST CT OF THE RIGHT FOOT WITHOUT CONTRAST TECHNIQUE: Multidetector CT imaging of the both feet were performed according to the standard protocol. Multiplanar CT image reconstructions were also generated. COMPARISON:  Same day radiographs of the feet FINDINGS: RIGHT FOOT: An acute, comminuted Sanders type 3 AC fracture of the calcaneus with intra-articular involvement of the posterior articular facet of the subtalar joint is noted with depressed middle component up to 5 mm. Fracture  lucencies also extend into the calcaneocuboid articulation as well as the sinus tarsi without encroachment. Ligaments Suboptimally assessed by CT. Muscles and Tendons No intramuscular hemorrhage. The peroneal tendons are intact without encroachment or entrapment. Medial tendons crossing the ankle joint are without definite entrapment. Extensor tendons crossing the ankle joint are unremarkable. Soft tissues Expected soft tissue swelling about the ankle and hindfoot secondary to the comminuted calcaneal fracture. No abnormal fluid collections. LEFT FOOT: An acute, comminuted Sanders type 3 AB fracture of the calcaneus is noted with intra-articular extension into the subtalar and calcaneocuboid articulations. Slight varus deformity of the calcaneus secondary to the fracture, series 12, image 162. The main fracture fragment is slightly depressed on the coronal images along its medial aspect by 5 mm as well. Remainder of the visualized mid foot, metatarsals and toes are intact. The distal tibia and fibula are unremarkable. The ankle mortise is congruent. Ligaments Suboptimally assessed by CT. Muscle and tendons The medial and lateral tendons crossing the ankle joint do not appear entrapped by the fracture fragments. Soft tissues: Expected soft tissue swelling about the ankle and hindfoot secondary to the comminuted calcaneal fractures. IMPRESSION: 1. Acute, closed, comminuted Sanders type 3 AC fracture the calcaneus with intra-articular involvement of the subtalar and calcaneocuboid articulations. Approximately 5 mm depression is noted of the central calcaneal  fracture fragment. 2. Acute, closed, comminuted Sanders type 3 AB fracture of the calcaneus with intra-articular extension into the subtalar and calcaneocuboid articulations and also demonstrating 5 mm of depression along the medial aspect of the main central calcaneal fracture fragment. Electronically Signed   By: Ashley Royalty M.D.   On: 06/27/2016 19:19   Ct  Foot Right Wo Contrast  Result Date: 06/27/2016 CLINICAL DATA:  Bilateral foot pain after jumping out from balcony. EXAM: CT OF THE LEFT FOOT WITHOUT CONTRAST CT OF THE RIGHT FOOT WITHOUT CONTRAST TECHNIQUE: Multidetector CT imaging of the both feet were performed according to the standard protocol. Multiplanar CT image reconstructions were also generated. COMPARISON:  Same day radiographs of the feet FINDINGS: RIGHT FOOT: An acute, comminuted Sanders type 3 AC fracture of the calcaneus with intra-articular involvement of the posterior articular facet of the subtalar joint is noted with depressed middle component up to 5 mm. Fracture lucencies also extend into the calcaneocuboid articulation as well as the sinus tarsi without encroachment. Ligaments Suboptimally assessed by CT. Muscles and Tendons No intramuscular hemorrhage. The peroneal tendons are intact without encroachment or entrapment. Medial tendons crossing the ankle joint are without definite entrapment. Extensor tendons crossing the ankle joint are unremarkable. Soft tissues Expected soft tissue swelling about the ankle and hindfoot secondary to the comminuted calcaneal fracture. No abnormal fluid collections. LEFT FOOT: An acute, comminuted Sanders type 3 AB fracture of the calcaneus is noted with intra-articular extension into the subtalar and calcaneocuboid articulations. Slight varus deformity of the calcaneus secondary to the fracture, series 12, image 162. The main fracture fragment is slightly depressed on the coronal images along its medial aspect by 5 mm as well. Remainder of the visualized mid foot, metatarsals and toes are intact. The distal tibia and fibula are unremarkable. The ankle mortise is congruent. Ligaments Suboptimally assessed by CT. Muscle and tendons The medial and lateral tendons crossing the ankle joint do not appear entrapped by the fracture fragments. Soft tissues: Expected soft tissue swelling about the ankle and hindfoot  secondary to the comminuted calcaneal fractures. IMPRESSION: 1. Acute, closed, comminuted Sanders type 3 AC fracture the calcaneus with intra-articular involvement of the subtalar and calcaneocuboid articulations. Approximately 5 mm depression is noted of the central calcaneal fracture fragment. 2. Acute, closed, comminuted Sanders type 3 AB fracture of the calcaneus with intra-articular extension into the subtalar and calcaneocuboid articulations and also demonstrating 5 mm of depression along the medial aspect of the main central calcaneal fracture fragment. Electronically Signed   By: Ashley Royalty M.D.   On: 06/27/2016 19:19   Dg Foot Complete Left  Result Date: 06/27/2016 CLINICAL DATA:  Jumping from a balcony, left foot pain EXAM: LEFT FOOT - COMPLETE 3+ VIEW COMPARISON:  06/27/2016 FINDINGS: There is an acute comminuted fracture of the calcaneus extending into the subtalar joint articular surface. Lateral view is oblique but the calcaneus is severely flattened and deformed. The talus appears impacted upon the fractured calcaneus. No subluxation or dislocation. Diffuse soft tissue swelling. IMPRESSION: Comminuted calcaneal fracture involving the subtalar joint with soft tissue swelling. Electronically Signed   By: Jerilynn Mages.  Shick M.D.   On: 06/27/2016 16:16   Dg Foot Complete Right  Result Date: 06/27/2016 CLINICAL DATA:  Patient reports jumping from a 43f balcony, reports immediately after his left and right tib/fib were numb, reports now his pain is distal to both of his tib/fibs. EXAM: RIGHT FOOT COMPLETE - 3+ VIEW COMPARISON:  None. FINDINGS: There is a  comminuted fracture of the calcaneus, not well-defined due to positioning on the lateral view. This appears have an intra-articular component. No other fractures. The calcaneal articulations are not well visualized in this exam. Remaining foot articulations are normally spaced and aligned. There is diffuse mid and hindfoot soft tissue swelling. IMPRESSION:  Comminuted fracture of the calcaneus not well visualized on this exam. Recommend follow-up CT for further characterization of the fracture. Electronically Signed   By: Lajean Manes M.D.   On: 06/27/2016 16:12   Dg C-arm 1-60 Min  Result Date: 07/01/2016 CLINICAL DATA:  Calcaneal fracture EXAM: RIGHT OS CALCIS - 2+ VIEW; DG C-ARM 61-120 MIN COMPARISON:  06/29/2016 FINDINGS: Patient is status post ORIF of the comminuted calcaneal fracture. Fracture lines remain visible. Stable alignment. No significant interval change. IMPRESSION: Stable ORIF of the left calcaneal fracture. Electronically Signed   By: Jerilynn Mages.  Shick M.D.   On: 07/01/2016 12:12   Dg C-arm 1-60 Min  Result Date: 06/29/2016 CLINICAL DATA:  ORIF left calcaneal fracture. EXAM: DG C-ARM 61-120 MIN COMPARISON:  CT 06/27/2016.  Left foot 06/27/2016 FINDINGS: ORIF left calcaneal fracture. Hardware intact. Near anatomic alignment. Two images obtained. 0 minutes 43 seconds fluoroscopy time . IMPRESSION: ORIF left calcaneal fracture. Electronically Signed   By: Marcello Moores  Register   On: 06/29/2016 16:23     Assessment/Plan   ICD-10-CM   1. Intractable vomiting with nausea, unspecified vomiting type R11.2   2. Diarrhea, unspecified type R19.7    likely med induced  3. Constipation due to opioid therapy K59.03    T40.2X5A    stable  4. S/P ORIF (open reduction internal fixation) fracture Z96.7    Z87.81     left on 06/29/16 and right 07/01/16; due to b/l calcaneal fx  5. Paranoid schizophrenia, chronic condition ([place]) F20.0     D/c colace due to N/V  D/c oxycodone as he does not take it and prefers to take off MAR  F/u with Ortho in 2 weeks   Cont other meds as ordered  PT/OT/ST as indicated  GOAL: short term rehab and d/c home when medically appropriate. Communicated with pt and nursing.  Will follow  Yailine Ballard S. Perlie Gold  [facility] [address] [place], [place]  [address] [phone] Cell (Monday-Friday 8 AM - 5 PM) [phone] After 5 PM and follow prompts

## 2016-07-26 ENCOUNTER — Ambulatory Visit (INDEPENDENT_AMBULATORY_CARE_PROVIDER_SITE_OTHER): Payer: Medicaid Other

## 2016-07-26 ENCOUNTER — Ambulatory Visit (INDEPENDENT_AMBULATORY_CARE_PROVIDER_SITE_OTHER): Payer: Medicaid Other | Admitting: Orthopaedic Surgery

## 2016-07-26 ENCOUNTER — Encounter (INDEPENDENT_AMBULATORY_CARE_PROVIDER_SITE_OTHER): Payer: Self-pay | Admitting: Orthopaedic Surgery

## 2016-07-26 DIAGNOSIS — S92002A Unspecified fracture of left calcaneus, initial encounter for closed fracture: Secondary | ICD-10-CM

## 2016-07-26 DIAGNOSIS — S92001A Unspecified fracture of right calcaneus, initial encounter for closed fracture: Secondary | ICD-10-CM

## 2016-07-26 DIAGNOSIS — S92001D Unspecified fracture of right calcaneus, subsequent encounter for fracture with routine healing: Secondary | ICD-10-CM

## 2016-07-26 DIAGNOSIS — S92002D Unspecified fracture of left calcaneus, subsequent encounter for fracture with routine healing: Secondary | ICD-10-CM

## 2016-07-26 NOTE — Progress Notes (Signed)
   Office Visit Note   Patient: Bob GladdenJosh M Peedin           Date of Birth: April 23, 1980           MRN: [id number] Visit Date: 07/26/2016              Requested by: No referring provider defined for this encounter. PCP: Patient, No Pcp Per   Assessment & Plan: Visit Diagnoses:  1. Closed fracture of both calcanei with routine healing, subsequent encounter     Plan: Follow-up bilateral calcaneal fractures post ORIF. Incisions look good. He has foot swelling and needs to keep his foot elevated more. Return 4 weeks for repeat x-rays and then we can determine about possibly starting weightbearing at that time.  Follow-Up Instructions: Return in about 4 weeks (around 08/23/2016).   Orders:  Orders Placed This Encounter  Procedures  . XR Foot 2 Views Right  . XR Foot 2 Views Left   No orders of the defined types were placed in this encounter.     Procedures: No procedures performed   Clinical Data: No additional findings.   Subjective: Chief Complaint  Patient presents with  . Right Foot - Routine Post Op  . Left Foot - Routine Post Op    HPI patient returns postop after ORIF bilateral calcaneus fractures.  Review of Systems unchanged from surgery   Objective: Vital Signs: BP (!) 156/95   Pulse (!) 142   Ht 6\' 4"  (1.93 m)   Wt 224 lb (101.6 kg)   BMI 27.27 kg/m   Physical Exam  Ortho Exam  Specialty Comments:  No specialty comments available.  Imaging: Xr Foot 2 Views Left  Result Date: 07/26/2016 Lateral heel x-ray and Harris heel view tangential x-rays obtained. This shows lateral calcaneal plate with screw fixation. Comminuted fracture in satisfactory alignment. Impression: Satisfactory postop calcaneous ORIF, left  Xr Foot 2 Views Right  Result Date: 07/26/2016 AP and tangential heel view obtained right calcaneous. This shows plate fixation good position of screws. He had comminuted fracture in acceptable alignment. Impression: Satisfactory postop ORIF  calcaneus fracture, right     PMFS History: Patient Active Problem List   Diagnosis Date Noted  . Constipation due to opioid therapy 07/05/2016  . Closed fracture of bone of right foot   . Closed fracture of bone of left foot   . Paranoid schizophrenia, chronic condition (HCC) 06/28/2016  . Bilateral calcaneal fractures 06/27/2016   Past Medical History:  Diagnosis Date  . Closed fracture of bone of left foot   . Closed fracture of bone of right foot   . Paranoid schizophrenia, chronic condition (HCC) 06/28/2016    No family history on file.  Past Surgical History:  Procedure Laterality Date  . ORIF CALCANEOUS FRACTURE Left 06/29/2016   Procedure: OPEN REDUCTION INTERNAL FIXATION (ORIF) CALCANEOUS FRACTURE;  Surgeon: Eldred MangesYates, Kari Kerth C, MD;  Location: [facility];  Service: Orthopedics;  Laterality: Left;  . ORIF CALCANEOUS FRACTURE Right 07/01/2016   Procedure: OPEN REDUCTION INTERNAL FIXATION (ORIF) RIGHT CALCANEOUS FRACTURE;  Surgeon: Eldred MangesYates, Jamirah Zelaya C, MD;  Location: [facility] OR;  Service: Orthopedics;  Laterality: Right;   Social History   Occupational History  . Not on file.   Social History Main Topics  . Smoking status: Never Smoker  . Smokeless tobacco: Never Used  . Alcohol use No  . Drug use: No  . Sexual activity: Not on file

## 2016-08-02 ENCOUNTER — Encounter: Payer: Self-pay | Admitting: Internal Medicine

## 2016-08-02 ENCOUNTER — Non-Acute Institutional Stay (SKILLED_NURSING_FACILITY): Payer: Medicaid Other | Admitting: Internal Medicine

## 2016-08-02 DIAGNOSIS — D72829 Elevated white blood cell count, unspecified: Secondary | ICD-10-CM | POA: Diagnosis not present

## 2016-08-02 DIAGNOSIS — Z9889 Other specified postprocedural states: Secondary | ICD-10-CM

## 2016-08-02 DIAGNOSIS — Z8781 Personal history of (healed) traumatic fracture: Secondary | ICD-10-CM | POA: Diagnosis not present

## 2016-08-02 DIAGNOSIS — F200 Paranoid schizophrenia: Secondary | ICD-10-CM | POA: Diagnosis not present

## 2016-08-02 DIAGNOSIS — Z967 Presence of other bone and tendon implants: Secondary | ICD-10-CM

## 2016-08-02 NOTE — Progress Notes (Signed)
DATE: 08/02/16  Location:    [facility] Room Number: 125B Place of Service: SNF (31)   Extended Emergency Contact Information Primary Emergency Contact: Hunter,Tia Address: [address]          [place] [address] [place] of [place] Phone: [phone] Relation: Mother  Advanced Directive information Does Patient Have a Medical Advance Directive?: No;Yes, Type of Advance Directive: Out of facility DNR (pink MOST or yellow form)  Chief Complaint  Patient presents with  . Medical Management of Chronic Issues    routine visit    HPI:  36 yo short term rehab male seen today for f/u. He saw Ortho on 6/12th and was told to continue NWB. Incisions ok. xrays showed ORIF intact. He has no concerns today. No f/c. No new numbness/tingling. No pain. He continues to take xeralto for DVT/PE prophylaxis. No bleeding or easy bruising. WBCs 14.5K   Paranoid schizophrenia - mood stable. He takes long term zyprexa 10 mg daily  Constipation - resolved. He has miralax prn   Past Medical History:  Diagnosis Date  . Closed fracture of bone of left foot   . Closed fracture of bone of right foot   . Paranoid schizophrenia, chronic condition ([place]) 06/28/2016    Past Surgical History:  Procedure Laterality Date  . ORIF CALCANEOUS FRACTURE Left 06/29/2016   Procedure: OPEN REDUCTION INTERNAL FIXATION (ORIF) CALCANEOUS FRACTURE;  Surgeon: Marybelle Killings, MD;  Location: [facility];  Service: Orthopedics;  Laterality: Left;  . ORIF CALCANEOUS FRACTURE Right 07/01/2016   Procedure: OPEN REDUCTION INTERNAL FIXATION (ORIF) RIGHT CALCANEOUS FRACTURE;  Surgeon: Marybelle Killings, MD;  Location: [facility];  Service: Orthopedics;  Laterality: Right;    Patient Care Team: Patient, No Pcp Per as PCP - General (General Practice)  Social History   Social History  . Marital status: Single    Spouse name: N/A  . Number of children: N/A  . Years of education: N/A   Occupational History  . Not  on file.   Social History Main Topics  . Smoking status: Never Smoker  . Smokeless tobacco: Never Used  . Alcohol use No  . Drug use: No  . Sexual activity: Not on file   Other Topics Concern  . Not on file   Social History Narrative  . No narrative on file     reports that he has never smoked. He has never used smokeless tobacco. He reports that he does not drink alcohol or use drugs.  History reviewed. No pertinent family history. No family status information on file.    Immunization History  Administered Date(s) Administered  . PPD Test 07/04/2016    No Known Allergies  Medications: Patient's Medications  New Prescriptions   No medications on file  Previous Medications   OLANZAPINE ZYDIS (ZYPREXA) 10 MG DISINTEGRATING TABLET    Take 10 mg by mouth every morning.   POLYETHYLENE GLYCOL (MIRALAX / GLYCOLAX) PACKET    Take 17 g by mouth daily as needed for mild constipation.   RIVAROXABAN (XARELTO) 10 MG TABS TABLET    Take 1 tablet (10 mg total) by mouth daily.  Modified Medications   No medications on file  Discontinued Medications   DOCUSATE SODIUM (COLACE) 50 MG CAPSULE    Take 2 capsules (100 mg) by mouth two times daily for Bowel Management   OXYCODONE (OXY IR/ROXICODONE) 5 MG IMMEDIATE RELEASE TABLET    Take 1 tablet (5 mg total) by mouth every  4 (four) hours as needed for breakthrough pain.    Review of Systems  Musculoskeletal: Positive for gait problem.  Skin: Positive for wound.  All other systems reviewed and are negative.   Vitals:   08/02/16 1417  BP: 122/80  Pulse: 95  Resp: 12  Weight: 224 lb 3.2 oz (101.7 kg)  Height: _0  (1.93 m)   Body mass index is 27.29 kg/m.  Physical Exam  Constitutional: He is oriented to person, place, and time. He appears well-developed and well-nourished.  HENT:  Mouth/Throat: Oropharynx is clear and moist.  MMM; no oral thrush  Eyes: Pupils are equal, round, and reactive to light. No scleral icterus.    Neck: Neck supple. Carotid bruit is not present. No thyromegaly present.  Cardiovascular: Normal rate, regular rhythm, normal heart sounds and intact distal pulses.  Exam reveals no gallop and no friction rub.   No murmur heard. B/l foot/ankle wrapped with excellent capillary refill in toes. No calf TTP  Pulmonary/Chest: Effort normal and breath sounds normal. He has no wheezes. He has no rales. He exhibits no tenderness.  Abdominal: Soft. Normal appearance and bowel sounds are normal. He exhibits no distension, no abdominal bruit, no pulsatile midline mass and no mass. There is no hepatomegaly. There is no tenderness. There is no rigidity, no rebound and no guarding. No hernia.  Musculoskeletal: He exhibits edema and tenderness.  B/l ankle/foot wrapped with FROM toes.  Lymphadenopathy:    He has no cervical adenopathy.  Neurological: He is alert and oriented to person, place, and time. He has normal reflexes.  Skin: Skin is warm and dry. No rash noted.  (+) tattoos  Psychiatric: He has a normal mood and affect. His behavior is normal. Thought content normal.     Labs reviewed: Abstract on 07/06/2016  Component Date Value Ref Range Status  . Hemoglobin 07/06/2016 14.4  13.5 - 17.5 g/dL Final  . HCT 07/06/2016 42  41 - 53 % Final  . Neutrophils Absolute 07/06/2016 10  /L Final  . Platelets 07/06/2016 366  150 - 399 K/L Final  . WBC 07/06/2016 14.5  10^3/mL Final  Admission on 06/27/2016, Discharged on 07/04/2016  Component Date Value Ref Range Status  . WBC 06/27/2016 18.2* 4.0 - 10.5 K/uL Final  . RBC 06/27/2016 5.45  4.22 - 5.81 MIL/uL Final  . Hemoglobin 06/27/2016 16.1  13.0 - 17.0 g/dL Final  . HCT 06/27/2016 45.3  39.0 - 52.0 % Final  . MCV 06/27/2016 83.1  78.0 - 100.0 fL Final  . MCH 06/27/2016 29.5  26.0 - 34.0 pg Final  . MCHC 06/27/2016 35.5  30.0 - 36.0 g/dL Final  . RDW 06/27/2016 13.0  11.5 - 15.5 % Final  . Platelets 06/27/2016 271  150 - 400 K/uL Final  . Sodium  06/27/2016 139  135 - 145 mmol/L Final  . Potassium 06/27/2016 3.6  3.5 - 5.1 mmol/L Final  . Chloride 06/27/2016 103  101 - 111 mmol/L Final  . CO2 06/27/2016 22  22 - 32 mmol/L Final  . Glucose, Bld 06/27/2016 77  65 - 99 mg/dL Final  . BUN 06/27/2016 12  6 - 20 mg/dL Final  . Creatinine, Ser 06/27/2016 1.22  0.61 - 1.24 mg/dL Final  . Calcium 06/27/2016 9.7  8.9 - 10.3 mg/dL Final  . GFR calc non Af Amer 06/27/2016 >60  >60 mL/min Final  . GFR calc Af Amer 06/27/2016 >60  >60 mL/min Final   Comment: (NOTE) The  eGFR has been calculated using the CKD EPI equation. This calculation has not been validated in all clinical situations. eGFR's persistently <60 mL/min signify possible Chronic Kidney Disease.   . Anion gap 06/27/2016 14  5 - 15 Final  . Opiates 06/28/2016 NONE DETECTED  NONE DETECTED Final  . Cocaine 06/28/2016 NONE DETECTED  NONE DETECTED Final  . Benzodiazepines 06/28/2016 NONE DETECTED  NONE DETECTED Final  . Amphetamines 06/28/2016 NONE DETECTED  NONE DETECTED Final  . Tetrahydrocannabinol 06/28/2016 NONE DETECTED  NONE DETECTED Final  . Barbiturates 06/28/2016 NONE DETECTED  NONE DETECTED Final   Comment:        DRUG SCREEN FOR MEDICAL PURPOSES ONLY.  IF CONFIRMATION IS NEEDED FOR ANY PURPOSE, NOTIFY LAB WITHIN 5 DAYS.        LOWEST DETECTABLE LIMITS FOR URINE DRUG SCREEN Drug Class       Cutoff (ng/mL) Amphetamine      1000 Barbiturate      200 Benzodiazepine   401 Tricyclics       027 Opiates          300 Cocaine          300 THC              50   . HIV Screen 4th Generation wRfx 06/27/2016 Non Reactive  Non Reactive Final   Comment: (NOTE) Performed At: [facility] [place], [place] [id number] Lindon Romp MD KV:[id number]   . Neutrophils Relative % 06/27/2016 82  % Final  . Neutro Abs 06/27/2016 12.7* 1.7 - 7.7 K/uL Final  . Lymphocytes Relative 06/27/2016 13  % Final  . Lymphs Abs 06/27/2016 2.0  0.7 - 4.0 K/uL Final  .  Monocytes Relative 06/27/2016 5  % Final  . Monocytes Absolute 06/27/2016 0.7  0.1 - 1.0 K/uL Final  . Eosinophils Relative 06/27/2016 0  % Final  . Eosinophils Absolute 06/27/2016 0.0  0.0 - 0.7 K/uL Final  . Basophils Relative 06/27/2016 0  % Final  . Basophils Absolute 06/27/2016 0.0  0.0 - 0.1 K/uL Final  . Alcohol, Ethyl (B) 06/27/2016 <5  <5 mg/dL Final   Comment:        LOWEST DETECTABLE LIMIT FOR SERUM ALCOHOL IS 5 mg/dL FOR MEDICAL PURPOSES ONLY   . Specimen Description 06/27/2016 BLOOD LEFT ANTECUBITAL   Final  . Special Requests 06/27/2016 BOTTLES DRAWN AEROBIC AND ANAEROBIC Blood Culture adequate volume   Final  . Culture 06/27/2016 NO GROWTH 5 DAYS   Final  . Report Status 06/27/2016 07/02/2016 FINAL   Final  . Specimen Description 06/27/2016 BLOOD RIGHT HAND   Final  . Special Requests 06/27/2016 BOTTLES DRAWN AEROBIC ONLY Blood Culture adequate volume   Final  . Culture 06/27/2016 NO GROWTH 5 DAYS   Final  . Report Status 06/27/2016 07/02/2016 FINAL   Final  . HCV Ab 06/28/2016 <0.1  0.0 - 0.9 s/co ratio Final   Comment: (NOTE) Performed At: [facility] [place], [place] [id number] Lindon Romp MD [id number]   . Prolactin 06/29/2016 23.5* 4.0 - 15.2 ng/mL Final   Comment: (NOTE) Performed At: [facility] [place], [place] [id number] Lindon Romp MD [id number]   . Total Protein 06/29/2016 7.0  6.5 - 8.1 g/dL Final  . Albumin 06/29/2016 4.0  3.5 - 5.0 g/dL Final  . AST 06/29/2016 25  15 - 41 U/L Final  . ALT 06/29/2016 31  17 - 63 U/L Final  .  Alkaline Phosphatase 06/29/2016 70  38 - 126 U/L Final  . Total Bilirubin 06/29/2016 1.7* 0.3 - 1.2 mg/dL Final  . Bilirubin, Direct 06/29/2016 0.2  0.1 - 0.5 mg/dL Final  . Indirect Bilirubin 06/29/2016 1.5* 0.3 - 0.9 mg/dL Final  . Cholesterol 06/29/2016 159  0 - 200 mg/dL Final  . Triglycerides 06/29/2016 179* <150 mg/dL Final  . HDL 06/29/2016 33* >40 mg/dL  Final  . Total CHOL/HDL Ratio 06/29/2016 4.8  RATIO Final  . VLDL 06/29/2016 36  0 - 40 mg/dL Final  . LDL Cholesterol 06/29/2016 90  0 - 99 mg/dL Final   Comment:        Total Cholesterol/HDL:CHD Risk Coronary Heart Disease Risk Table                     Men   Women  1/2 Average Risk   3.4   3.3  Average Risk       5.0   4.4  2 X Average Risk   9.6   7.1  3 X Average Risk  23.4   11.0        Use the calculated Patient Ratio above and the CHD Risk Table to determine the patient's CHD Risk.        ATP III CLASSIFICATION (LDL):  <100     mg/dL   Optimal  100-129  mg/dL   Near or Above                    Optimal  130-159  mg/dL   Borderline  160-189  mg/dL   High  >190     mg/dL   Very High   . Comment: 06/28/2016 Comment   Final   Comment: (NOTE) Non reactive HCV antibody screen is consistent with no HCV infection, unless recent infection is suspected or other evidence exists to indicate HCV infection. Performed At: [facility] [place], [place] [id number] Lindon Romp MD [id number]   . MRSA, PCR 06/29/2016 NEGATIVE  NEGATIVE Final  . Staphylococcus aureus 06/29/2016 POSITIVE* NEGATIVE Final   Comment:        The Xpert SA Assay (FDA approved for NASAL specimens in patients over 12 years of age), is one component of a comprehensive surveillance program.  Test performance has been validated by [facility] for patients greater than or equal to 57 year old. It is not intended to diagnose infection nor to guide or monitor treatment.   . Sodium 06/30/2016 136  135 - 145 mmol/L Final  . Potassium 06/30/2016 3.5  3.5 - 5.1 mmol/L Final  . Chloride 06/30/2016 98* 101 - 111 mmol/L Final  . CO2 06/30/2016 26  22 - 32 mmol/L Final  . Glucose, Bld 06/30/2016 119* 65 - 99 mg/dL Final  . BUN 06/30/2016 11  6 - 20 mg/dL Final  . Creatinine, Ser 06/30/2016 1.14  0.61 - 1.24 mg/dL Final  . Calcium 06/30/2016 9.0  8.9 - 10.3 mg/dL Final  . GFR calc  non Af Amer 06/30/2016 >60  >60 mL/min Final  . GFR calc Af Amer 06/30/2016 >60  >60 mL/min Final   Comment: (NOTE) The eGFR has been calculated using the CKD EPI equation. This calculation has not been validated in all clinical situations. eGFR's persistently <60 mL/min signify possible Chronic Kidney Disease.   . Anion gap 06/30/2016 12  5 - 15 Final  . Sodium 07/01/2016 137  135 - 145 mmol/L Final  .  Potassium 07/01/2016 3.4* 3.5 - 5.1 mmol/L Final  . Chloride 07/01/2016 102  101 - 111 mmol/L Final  . CO2 07/01/2016 26  22 - 32 mmol/L Final  . Glucose, Bld 07/01/2016 110* 65 - 99 mg/dL Final  . BUN 07/01/2016 15  6 - 20 mg/dL Final  . Creatinine, Ser 07/01/2016 1.09  0.61 - 1.24 mg/dL Final  . Calcium 07/01/2016 8.6* 8.9 - 10.3 mg/dL Final  . GFR calc non Af Amer 07/01/2016 >60  >60 mL/min Final  . GFR calc Af Amer 07/01/2016 >60  >60 mL/min Final   Comment: (NOTE) The eGFR has been calculated using the CKD EPI equation. This calculation has not been validated in all clinical situations. eGFR's persistently <60 mL/min signify possible Chronic Kidney Disease.   . Anion gap 07/01/2016 9  5 - 15 Final  . Sodium 07/02/2016 138  135 - 145 mmol/L Final  . Potassium 07/02/2016 3.7  3.5 - 5.1 mmol/L Final  . Chloride 07/02/2016 103  101 - 111 mmol/L Final  . CO2 07/02/2016 26  22 - 32 mmol/L Final  . Glucose, Bld 07/02/2016 119* 65 - 99 mg/dL Final  . BUN 07/02/2016 13  6 - 20 mg/dL Final  . Creatinine, Ser 07/02/2016 1.01  0.61 - 1.24 mg/dL Final  . Calcium 07/02/2016 8.6* 8.9 - 10.3 mg/dL Final  . GFR calc non Af Amer 07/02/2016 >60  >60 mL/min Final  . GFR calc Af Amer 07/02/2016 >60  >60 mL/min Final   Comment: (NOTE) The eGFR has been calculated using the CKD EPI equation. This calculation has not been validated in all clinical situations. eGFR's persistently <60 mL/min signify possible Chronic Kidney Disease.   . Anion gap 07/02/2016 9  5 - 15 Final    Xr Foot 2 Views  Left  Result Date: 07/26/2016 Lateral heel x-ray and Harris heel view tangential x-rays obtained. This shows lateral calcaneal plate with screw fixation. Comminuted fracture in satisfactory alignment. Impression: Satisfactory postop calcaneous ORIF, left  Xr Foot 2 Views Right  Result Date: 07/26/2016 AP and tangential heel view obtained right calcaneous. This shows plate fixation good position of screws. He had comminuted fracture in acceptable alignment. Impression: Satisfactory postop ORIF calcaneus fracture, right     Assessment/Plan   ICD-10-CM   1. Leukocytosis, unspecified type D72.829    WBCs 14.5K  2. S/P ORIF (open reduction internal fixation) fracture Z96.7    Z87.81   3. Paranoid schizophrenia, chronic condition ([place]) F20.0      Check CBC w diff  Cont current meds as ordered  F/u with Ortho as scheduled  Wound care as ordered  PT/OT as ordered. He is NWB at this time  Will follow  Nickie Warwick S. Perlie Gold  [facility] [address] [place], [place] [address] [phone] Cell (Monday-Friday 8 AM - 5 PM) [phone] After 5 PM and follow prompts

## 2016-08-03 LAB — CBC AND DIFFERENTIAL
HCT: 44 (ref 41–53)
HEMOGLOBIN: 15 (ref 13.5–17.5)
Neutrophils Absolute: 6
PLATELETS: 215 (ref 150–399)
WBC: 9.1

## 2016-08-19 DIAGNOSIS — Z9889 Other specified postprocedural states: Secondary | ICD-10-CM | POA: Insufficient documentation

## 2016-08-19 DIAGNOSIS — Z8781 Personal history of (healed) traumatic fracture: Secondary | ICD-10-CM

## 2016-08-24 ENCOUNTER — Ambulatory Visit (INDEPENDENT_AMBULATORY_CARE_PROVIDER_SITE_OTHER): Payer: Medicaid Other

## 2016-08-24 ENCOUNTER — Ambulatory Visit (INDEPENDENT_AMBULATORY_CARE_PROVIDER_SITE_OTHER): Payer: Medicaid Other | Admitting: Orthopaedic Surgery

## 2016-08-24 ENCOUNTER — Encounter: Payer: Self-pay | Admitting: Adult Health

## 2016-08-24 ENCOUNTER — Encounter (INDEPENDENT_AMBULATORY_CARE_PROVIDER_SITE_OTHER): Payer: Self-pay | Admitting: Orthopaedic Surgery

## 2016-08-24 ENCOUNTER — Non-Acute Institutional Stay (SKILLED_NURSING_FACILITY): Payer: Medicaid Other | Admitting: Adult Health

## 2016-08-24 VITALS — BP 161/96 | HR 144

## 2016-08-24 DIAGNOSIS — R000 Tachycardia, unspecified: Secondary | ICD-10-CM

## 2016-08-24 DIAGNOSIS — S92902D Unspecified fracture of left foot, subsequent encounter for fracture with routine healing: Secondary | ICD-10-CM

## 2016-08-24 DIAGNOSIS — I10 Essential (primary) hypertension: Secondary | ICD-10-CM

## 2016-08-24 DIAGNOSIS — S92901D Unspecified fracture of right foot, subsequent encounter for fracture with routine healing: Secondary | ICD-10-CM | POA: Diagnosis not present

## 2016-08-24 NOTE — Progress Notes (Signed)
Location:   [facility]    Place of Service:  SNF (31)   CODE STATUS: full code   No Known Allergies  Chief Complaint  Patient presents with  . Acute Visit    high blood pressure     HPI:  His blood pressure is elevated. Nursing staff reports that he has had several readings that are elevated. He denies headache; visual changes; or chest pain. We did discuss the implications of untreated blood pressure. He has verbalized understanding of the information given and is in agreement with treatment.    Past Medical History:  Diagnosis Date  . Closed fracture of bone of left foot   . Closed fracture of bone of right foot   . Paranoid schizophrenia, chronic condition (HCC) 06/28/2016    Past Surgical History:  Procedure Laterality Date  . ORIF CALCANEOUS FRACTURE Left 06/29/2016   Procedure: OPEN REDUCTION INTERNAL FIXATION (ORIF) CALCANEOUS FRACTURE;  Surgeon: Eldred MangesYates, Mark C, MD;  Location: [facility] OR;  Service: Orthopedics;  Laterality: Left;  . ORIF CALCANEOUS FRACTURE Right 07/01/2016   Procedure: OPEN REDUCTION INTERNAL FIXATION (ORIF) RIGHT CALCANEOUS FRACTURE;  Surgeon: Eldred MangesYates, Mark C, MD;  Location: [facility] OR;  Service: Orthopedics;  Laterality: Right;    Social History   Social History  . Marital status: Single    Spouse name: N/A  . Number of children: N/A  . Years of education: N/A   Occupational History  . Not on file.   Social History Main Topics  . Smoking status: Never Smoker  . Smokeless tobacco: Never Used  . Alcohol use No  . Drug use: No  . Sexual activity: Not on file   Other Topics Concern  . Not on file   Social History Narrative  . No narrative on file   No family history on file.    VITAL SIGNS BP (!) 148/90   Pulse 66   Temp 97.6 F (36.4 C)   Resp 18   Ht 6\' 4"  (1.93 m)   Wt 210 lb 6.4 oz (95.4 kg)   BMI 25.61 kg/m   Patient's Medications  New Prescriptions   No medications on file  Previous Medications   OLANZAPINE ZYDIS (ZYPREXA)  10 MG DISINTEGRATING TABLET    Take 10 mg by mouth every morning.   POLYETHYLENE GLYCOL (MIRALAX / GLYCOLAX) PACKET    Take 17 g by mouth daily as needed for mild constipation.   RIVAROXABAN (XARELTO) 10 MG TABS TABLET    Take 1 tablet (10 mg total) by mouth daily.  Modified Medications   No medications on file  Discontinued Medications   No medications on file     SIGNIFICANT DIAGNOSTIC EXAMS  06-27-16: left foot x-ray: Comminuted calcaneal fracture involving the subtalar joint with soft tissue swelling.   06-27-16: right foot x-ray: Comminuted fracture of the calcaneus not well visualized on this exam. Recommend follow-up CT for further characterization of the fracture  06-27-16: right tibia/fibula x-ray: 1.  No acute osseous injury of the right tibia and fibula. 2. Severely comminuted fracture of the anterior, mid and posterior calcaneus.   06-27-16: left tibia/fibula x-ray: 1. Complex fracture of the calcaneus.  Recommend CT of the hindfoot. 2. No tibial fracture.  06-27-16: ct of bilateral feet: 1. Acute, closed, comminuted Sanders type 3 AC fracture the calcaneus with intra-articular involvement of the subtalar and calcaneocuboid articulations. Approximately 5 mm depression is noted of the central calcaneal fracture fragment. 2. Acute, closed, comminuted Sanders type 3 AB fracture  of the calcaneus with intra-articular extension into the subtalar and calcaneocuboid articulations and also demonstrating 5 mm of depression along the medial aspect of the main central calcaneal fracture fragment.  NO NEW EXAMS  LABS REVIEWED:   06-27-16: wbc 18.2; hgb 16.1; hct 45.3; mcv 83.1; plt 271; glucose 77; bun 12; creat 1.22; k+ 3.6; na++ 139; ca 9.7; HIV: nr 06-29-16: total bili 1.6; indirect bili: 1.5; albumin 4.0; chol 159; ldl 90; trig 179; hdl 33 07-02-16: glucose 119; bun 13; creat 1.01; k+ 3.7; na++ 138; ca 8.6   NO NEW LABS     Review of Systems  Constitutional: Negative for  malaise/fatigue.  Respiratory: Negative for cough and shortness of breath.   Cardiovascular: Negative for chest pain, palpitations and leg swelling.  Gastrointestinal: Negative for abdominal pain, constipation and heartburn.  Musculoskeletal: Negative for back pain, joint pain and myalgias.  Skin: Negative.   Neurological: Negative for dizziness.  Psychiatric/Behavioral: The patient is not nervous/anxious.      Physical Exam  Constitutional: He is oriented to person, place, and time. No distress.  Eyes: Conjunctivae are normal.  Neck: Neck supple. No JVD present. No thyromegaly present.  Cardiovascular: Normal rate, regular rhythm and intact distal pulses.   Respiratory: Effort normal and breath sounds normal. No respiratory distress. He has no wheezes.  GI: Soft. Bowel sounds are normal. He exhibits no distension. There is no tenderness.  Musculoskeletal: He exhibits no edema.  Able to move all extremities  Able to move all extremities  Bilateral heel fractures in ace wrap splint   Lymphadenopathy:    He has no cervical adenopathy.  Neurological: He is alert and oriented to person, place, and time.  Skin: Skin is warm and dry. He is not diaphoretic.  Has numerous tattoos on face and bilateral arms    Psychiatric: He has a normal mood and affect.    ASSESSMENT/ PLAN:  1. Hypertension: b/p 148/90: will begin lisinopril 10 mg daily; will have nursing check blood pressure for one week and report; will check bmp in one week.     MD is aware of resident's narcotic use and is in agreement with current plan of care. We will attempt to wean resident as apropriate   Synthia Innocent NP [facility]  Contact [phone] Monday through Friday 8am- 5pm  After hours call [phone]

## 2016-08-24 NOTE — Progress Notes (Signed)
   Post-Op Visit Note   Patient: Bob GladdenJosh M Reyes           Date of Birth: 1980-10-13           MRN: [id number] Visit Date: 08/24/2016 PCP: Patient, No Pcp Per   Assessment & Plan:  Chief Complaint:  Chief Complaint  Patient presents with  . Right Foot - Routine Post Op, Follow-up  . Left Foot - Follow-up, Routine Post Op   Patient returns. Status post ORIF right calcaneus fracture 07/01/2016 and ORIF left calcaneus fracture 06/29/2016. States that both feet are doing well. No complaints of pain. Patient's vital signs today show that he is hypertensive and tachycardic. He denies chest pain, shortness of breath, palpitations, dizziness.  Visit Diagnoses:  1. Closed fracture of right foot with routine healing, subsequent encounter   2. Closed fracture of left foot with routine healing, subsequent encounter   3. Hypertension, unspecified type   4. Tachycardia     Plan: Patient seen with Dr. Ophelia CharterYates. He is okay to start weightbearing as tolerated with walker. Will follow-up with us in 8 weeks for recheck but will return sooner if needed.  [facility] and I asked if they can have their facility physician evaluate patient. Patient stated that he is not having any chest pain, shortness of breath, palpitations, dizziness  Follow-Up Instructions: Return in about 8 weeks (around 10/19/2016).   Orders:  Orders Placed This Encounter  Procedures  . XR Foot 2 Views Right  . XR Foot 2 Views Left   No orders of the defined types were placed in this encounter.   Imaging: No results found.  PMFS History: Patient Active Problem List   Diagnosis Date Noted  . S/P ORIF (open reduction internal fixation) fracture 08/19/2016  . Constipation due to opioid therapy 07/05/2016  . Closed fracture of bone of right foot   . Closed fracture of bone of left foot   . Paranoid schizophrenia, chronic condition (HCC) 06/28/2016    . Bilateral calcaneal fractures 06/27/2016   Past Medical History:  Diagnosis Date  . Closed fracture of bone of left foot   . Closed fracture of bone of right foot   . Paranoid schizophrenia, chronic condition (HCC) 06/28/2016    No family history on file.  Past Surgical History:  Procedure Laterality Date  . ORIF CALCANEOUS FRACTURE Left 06/29/2016   Procedure: OPEN REDUCTION INTERNAL FIXATION (ORIF) CALCANEOUS FRACTURE;  Surgeon: Eldred MangesYates, Mark C, MD;  Location: [facility] OR;  Service: Orthopedics;  Laterality: Left;  . ORIF CALCANEOUS FRACTURE Right 07/01/2016   Procedure: OPEN REDUCTION INTERNAL FIXATION (ORIF) RIGHT CALCANEOUS FRACTURE;  Surgeon: Eldred MangesYates, Mark C, MD;  Location: [facility] OR;  Service: Orthopedics;  Laterality: Right;   Social History   Occupational History  . Not on file.   Social History Main Topics  . Smoking status: Never Smoker  . Smokeless tobacco: Never Used  . Alcohol use No  . Drug use: No  . Sexual activity: Not on file   Exam Blood pressure today 161/96, 165/96, 159/98 Pulse today 144, 147, 134  Pleasant black male alert and oriented in no acute distress. Bilateral feet he has dead skin diffusely. No signs of infection. Right greater than left foot swelling. Bilateral calves are nontender.

## 2016-08-30 ENCOUNTER — Non-Acute Institutional Stay (SKILLED_NURSING_FACILITY): Payer: Medicaid Other | Admitting: Adult Health

## 2016-08-30 ENCOUNTER — Encounter: Payer: Self-pay | Admitting: Adult Health

## 2016-08-30 DIAGNOSIS — T402X5A Adverse effect of other opioids, initial encounter: Secondary | ICD-10-CM | POA: Diagnosis not present

## 2016-08-30 DIAGNOSIS — Z8781 Personal history of (healed) traumatic fracture: Secondary | ICD-10-CM

## 2016-08-30 DIAGNOSIS — I10 Essential (primary) hypertension: Secondary | ICD-10-CM

## 2016-08-30 DIAGNOSIS — Z9889 Other specified postprocedural states: Secondary | ICD-10-CM

## 2016-08-30 DIAGNOSIS — S92002D Unspecified fracture of left calcaneus, subsequent encounter for fracture with routine healing: Secondary | ICD-10-CM | POA: Diagnosis not present

## 2016-08-30 DIAGNOSIS — S92001D Unspecified fracture of right calcaneus, subsequent encounter for fracture with routine healing: Secondary | ICD-10-CM | POA: Diagnosis not present

## 2016-08-30 DIAGNOSIS — F200 Paranoid schizophrenia: Secondary | ICD-10-CM

## 2016-08-30 DIAGNOSIS — K5903 Drug induced constipation: Secondary | ICD-10-CM | POA: Diagnosis not present

## 2016-08-30 DIAGNOSIS — Z967 Presence of other bone and tendon implants: Secondary | ICD-10-CM

## 2016-08-30 NOTE — Progress Notes (Signed)
Location:   [facility] Room Number: 125 B Place of Service:  SNF (31)   CODE STATUS: Full Code  No Known Allergies  Chief Complaint  Patient presents with  . Medical Management of Chronic Issues    1 month follow up    HPI:  He is a 36 year old short term resident of this facility being seen for the management of his chronic illnesses hypertension; bilateral closed calcanei fractures; paranoid schizophrenia; constipation .  He is doing well; he continues to work with therapy and continues to improve. He tells me that he is feeling good and has no complaints. There are no nursing concerns at this time.    Past Medical History:  Diagnosis Date  . Closed fracture of bone of left foot   . Closed fracture of bone of right foot   . Paranoid schizophrenia, chronic condition (HCC) 06/28/2016    Past Surgical History:  Procedure Laterality Date  . ORIF CALCANEOUS FRACTURE Left 06/29/2016   Procedure: OPEN REDUCTION INTERNAL FIXATION (ORIF) CALCANEOUS FRACTURE;  Surgeon: Eldred MangesYates, Mark C, MD;  Location: [facility] OR;  Service: Orthopedics;  Laterality: Left;  . ORIF CALCANEOUS FRACTURE Right 07/01/2016   Procedure: OPEN REDUCTION INTERNAL FIXATION (ORIF) RIGHT CALCANEOUS FRACTURE;  Surgeon: Eldred MangesYates, Mark C, MD;  Location: [facility] OR;  Service: Orthopedics;  Laterality: Right;    Social History   Social History  . Marital status: Single    Spouse name: N/A  . Number of children: N/A  . Years of education: N/A   Occupational History  . Not on file.   Social History Main Topics  . Smoking status: Never Smoker  . Smokeless tobacco: Never Used  . Alcohol use No  . Drug use: No  . Sexual activity: Not on file   Other Topics Concern  . Not on file   Social History Narrative  . No narrative on file   History reviewed. No pertinent family history.    VITAL SIGNS BP 122/73   Pulse 66   Ht 6\' 4"  (1.93 m)   Wt 223 lb 3.2 oz (101.2 kg)   BMI 27.17 kg/m   Patient's  Medications  New Prescriptions   No medications on file  Previous Medications   ACETAMINOPHEN (TYLENOL) 500 MG TABLET    Take 1,000 mg by mouth every 6 (six) hours as needed.   LISINOPRIL (PRINIVIL,ZESTRIL) 10 MG TABLET    Take 10 mg by mouth daily.   OLANZAPINE ZYDIS (ZYPREXA) 10 MG DISINTEGRATING TABLET    Take 10 mg by mouth every morning.   POLYETHYLENE GLYCOL (MIRALAX / GLYCOLAX) PACKET    Take 17 g by mouth daily as needed for mild constipation.   RIVAROXABAN (XARELTO) 10 MG TABS TABLET    Take 10 mg by mouth daily.  Modified Medications   No medications on file  Discontinued Medications   RIVAROXABAN (XARELTO) 10 MG TABS TABLET    Take 1 tablet (10 mg total) by mouth daily.     SIGNIFICANT DIAGNOSTIC EXAMS  PREVIOUS  06-27-16: left foot x-ray: Comminuted calcaneal fracture involving the subtalar joint with soft tissue swelling.   06-27-16: right foot x-ray: Comminuted fracture of the calcaneus not well visualized on this exam. Recommend follow-up CT for further characterization of the fracture  06-27-16: right tibia/fibula x-ray: 1.  No acute osseous injury of the right tibia and fibula. 2. Severely comminuted fracture of the anterior, mid and posterior calcaneus.   06-27-16: left tibia/fibula x-ray: 1. Complex fracture  of the calcaneus.  Recommend CT of the hindfoot. 2. No tibial fracture.  06-27-16: ct of bilateral feet: 1. Acute, closed, comminuted Sanders type 3 AC fracture the calcaneus with intra-articular involvement of the subtalar and calcaneocuboid articulations. Approximately 5 mm depression is noted of the central calcaneal fracture fragment. 2. Acute, closed, comminuted Sanders type 3 AB fracture of the calcaneus with intra-articular extension into the subtalar and calcaneocuboid articulations and also demonstrating 5 mm of depression along the medial aspect of the main central calcaneal fracture fragment.  NO NEW EXAMS  LABS REVIEWED:  PREVIOUS  06-27-16: wbc  18.2; hgb 16.1; hct 45.3; mcv 83.1; plt 271; glucose 77; bun 12; creat 1.22; k+ 3.6; na++ 139; ca 9.7; HIV: nr 06-29-16: total bili 1.6; indirect bili: 1.5; albumin 4.0; chol 159; ldl 90; trig 179; hdl 33 07-02-16: glucose 119; bun 13; creat 1.01; k+ 3.7; na++ 138; ca 8.6   TODAY:   07-06-16: wbc 14.5; hgb 14.4; hct 42.4; mcv 85.9; plt 366 08-03-16: wbc 9.1; hgb 15.0; hct 43.6; mcv 86.0; plt 215   Review of Systems  Constitutional: Negative for malaise/fatigue.  Respiratory: Negative for cough and shortness of breath.   Cardiovascular: Negative for chest pain, palpitations and leg swelling.  Gastrointestinal: Negative for abdominal pain, constipation and heartburn.  Musculoskeletal: Negative for back pain, joint pain and myalgias.  Skin: Negative.   Neurological: Negative for dizziness.  Psychiatric/Behavioral: The patient is not nervous/anxious.    Physical Exam  Constitutional: He is oriented to person, place, and time. No distress.  Eyes: Conjunctivae are normal.  Neck: Neck supple. No JVD present. No thyromegaly present.  Cardiovascular: Normal rate, regular rhythm and intact distal pulses.   Respiratory: Effort normal and breath sounds normal. No respiratory distress. He has no wheezes.  GI: Soft. Bowel sounds are normal. He exhibits no distension. There is no tenderness.  Musculoskeletal: He exhibits no edema.  Able to move all extremities  Is walking with therapy   Lymphadenopathy:    He has no cervical adenopathy.  Neurological: He is alert and oriented to person, place, and time.  Skin: Skin is warm and dry. He is not diaphoretic.  Psychiatric: He has a normal mood and affect.     ASSESSMENT/ PLAN:  TODAY  1. Hypertension: b/p 122/73: is stable  will continue lisinopril 10 mg daily  2. Paranoid schizophrenia; chronic condition: he is stable; is on long ter zyprex 10 mg daily will monitor  3. Constipation: will continue colace twice daily and miralax daily as  needed  4. Bilateral calcaneal fractures:is doing well  will continue therapy as directed; will follow up with orthopedics; ; will xarelto 10 mg daily for 56 days post discharge from hospital.     Synthia Innocent NP [facility]  Contact [phone] Monday through Friday 8am- 5pm  After hours call [phone]

## 2016-08-31 LAB — BASIC METABOLIC PANEL
BUN: 15 (ref 4–21)
Creatinine: 1 (ref 0.6–1.3)
GLUCOSE: 103
Potassium: 4.1 (ref 3.4–5.3)
Sodium: 141 (ref 137–147)

## 2016-09-06 ENCOUNTER — Non-Acute Institutional Stay (SKILLED_NURSING_FACILITY): Payer: Medicaid Other | Admitting: Adult Health

## 2016-09-06 ENCOUNTER — Encounter: Payer: Self-pay | Admitting: Adult Health

## 2016-09-06 DIAGNOSIS — Z8781 Personal history of (healed) traumatic fracture: Secondary | ICD-10-CM | POA: Diagnosis not present

## 2016-09-06 DIAGNOSIS — Z967 Presence of other bone and tendon implants: Secondary | ICD-10-CM

## 2016-09-06 DIAGNOSIS — S92002D Unspecified fracture of left calcaneus, subsequent encounter for fracture with routine healing: Secondary | ICD-10-CM | POA: Diagnosis not present

## 2016-09-06 DIAGNOSIS — F200 Paranoid schizophrenia: Secondary | ICD-10-CM | POA: Diagnosis not present

## 2016-09-06 DIAGNOSIS — S92001D Unspecified fracture of right calcaneus, subsequent encounter for fracture with routine healing: Secondary | ICD-10-CM | POA: Diagnosis not present

## 2016-09-06 DIAGNOSIS — Z9889 Other specified postprocedural states: Secondary | ICD-10-CM

## 2016-09-06 NOTE — Progress Notes (Signed)
Location:   [facility] Room Number: 125 B Place of Service:  SNF (31)    CODE STATUS: Full Code  No Known Allergies  Chief Complaint  Patient presents with  . Discharge Note    Discharging to Home    HPI:  He is being discharged to home with home health for rn services. He will need his prescriptions written and will to follow up with his medical provider.  He had been hospitalized for bilateral ankles fractures. He was admitted to this facility for short term rehab. He has done well with therapy and is ready to return back home.    Past Medical History:  Diagnosis Date  . Closed fracture of bone of left foot   . Closed fracture of bone of right foot   . Paranoid schizophrenia, chronic condition (HCC) 06/28/2016    Past Surgical History:  Procedure Laterality Date  . ORIF CALCANEOUS FRACTURE Left 06/29/2016   Procedure: OPEN REDUCTION INTERNAL FIXATION (ORIF) CALCANEOUS FRACTURE;  Surgeon: Eldred Manges, MD;  Location: [facility] OR;  Service: Orthopedics;  Laterality: Left;  . ORIF CALCANEOUS FRACTURE Right 07/01/2016   Procedure: OPEN REDUCTION INTERNAL FIXATION (ORIF) RIGHT CALCANEOUS FRACTURE;  Surgeon: Eldred Manges, MD;  Location: [facility] OR;  Service: Orthopedics;  Laterality: Right;    Social History   Social History  . Marital status: Single    Spouse name: N/A  . Number of children: N/A  . Years of education: N/A   Occupational History  . Not on file.   Social History Main Topics  . Smoking status: Never Smoker  . Smokeless tobacco: Never Used  . Alcohol use No  . Drug use: No  . Sexual activity: Not on file   Other Topics Concern  . Not on file   Social History Narrative  . No narrative on file   History reviewed. No pertinent family history.  VITAL SIGNS BP 138/80   Pulse 80   Temp 98.4 F (36.9 C)   Resp 16   Ht 6\' 5"  (1.956 m)   Wt 223 lb 3.2 oz (101.2 kg)   SpO2 98%   BMI 26.47 kg/m   Patient's Medications  New Prescriptions   No medications on file  Previous Medications   ACETAMINOPHEN (TYLENOL) 500 MG TABLET    Take 1,000 mg by mouth every 6 (six) hours as needed.   LISINOPRIL (PRINIVIL,ZESTRIL) 10 MG TABLET    Take 10 mg by mouth daily.   OLANZAPINE ZYDIS (ZYPREXA) 10 MG DISINTEGRATING TABLET    Take 10 mg by mouth every morning.   POLYETHYLENE GLYCOL (MIRALAX / GLYCOLAX) PACKET    Take 17 g by mouth daily as needed for mild constipation.  Modified Medications   No medications on file  Discontinued Medications   No medications on file     SIGNIFICANT DIAGNOSTIC EXAMS   PREVIOUS  06-27-16: left foot x-ray: Comminuted calcaneal fracture involving the subtalar joint with soft tissue swelling.   06-27-16: right foot x-ray: Comminuted fracture of the calcaneus not well visualized on this exam. Recommend follow-up CT for further characterization of the fracture  06-27-16: right tibia/fibula x-ray: 1.  No acute osseous injury of the right tibia and fibula. 2. Severely comminuted fracture of the anterior, mid and posterior calcaneus.   06-27-16: left tibia/fibula x-ray: 1. Complex fracture of the calcaneus.  Recommend CT of the hindfoot. 2. No tibial fracture.  06-27-16: ct of bilateral feet: 1. Acute, closed, comminuted Sanders type 3 AC  fracture the calcaneus with intra-articular involvement of the subtalar and calcaneocuboid articulations. Approximately 5 mm depression is noted of the central calcaneal fracture fragment. 2. Acute, closed, comminuted Sanders type 3 AB fracture of the calcaneus with intra-articular extension into the subtalar and calcaneocuboid articulations and also demonstrating 5 mm of depression along the medial aspect of the main central calcaneal fracture fragment.  NO NEW EXAMS  LABS REVIEWED:  PREVIOUS  06-27-16: wbc 18.2; hgb 16.1; hct 45.3; mcv 83.1; plt 271; glucose 77; bun 12; creat 1.22; k+ 3.6; na++ 139; ca 9.7; HIV: nr 06-29-16: total bili 1.6; indirect bili: 1.5; albumin 4.0; chol  159; ldl 90; trig 179; hdl 33 07-02-16: glucose 119; bun 13; creat 1.01; k+ 3.7; na++ 138; ca 8.6   TODAY 08-31-16: glucose 103; bun 14.9; creat 1.00; k+ 4.1; na++ 141; ca 9.0     Review of Systems  Constitutional: Negative for malaise/fatigue.  Respiratory: Negative for cough and shortness of breath.   Cardiovascular: Negative for chest pain, palpitations and leg swelling.  Gastrointestinal: Negative for abdominal pain, constipation and heartburn.  Musculoskeletal: Negative for back pain, joint pain and myalgias.  Skin: Negative.   Neurological: Negative for dizziness.  Psychiatric/Behavioral: The patient is not nervous/anxious.    Physical Exam  Constitutional: He is oriented to person, place, and time. No distress.  Eyes: Conjunctivae are normal.  Neck: Neck supple. No JVD present. No thyromegaly present.  Cardiovascular: Normal rate, regular rhythm and intact distal pulses.   Respiratory: Effort normal and breath sounds normal. No respiratory distress. He has no wheezes.  GI: Soft. Bowel sounds are normal. He exhibits no distension. There is no tenderness.  Musculoskeletal: He exhibits no edema.  Able to move all extremities   Lymphadenopathy:    He has no cervical adenopathy.  Neurological: He is alert and oriented to person, place, and time.  Skin: Skin is warm and dry. He is not diaphoretic.  Psychiatric: He has a normal mood and affect.    ASSESSMENT/ PLAN:   Patient is being discharged with the following home health services:  RN to evaluate and treat as indicated for medication management.   Patient is being discharged with the following durable medical equipment:  He will need a tall straight cane for him to maintain his current level of independence with ambulation.   Patient has been advised to f/u with their PCP in 1-2 weeks to bring them up to date on their rehab stay.  Social services at facility was responsible for arranging this appointment.  Pt was provided  with a 30 day supply of prescriptions for medications and refills must be obtained from their PCP.  For controlled substances, a more limited supply may be provided adequate until PCP appointment only.   Time spent on discharge 40 minutes: to ensure home health setup; prescriptions written and dme written for.    Synthia Innocenteborah Green NP [facility]  Contact [phone] Monday through Friday 8am- 5pm  After hours call [phone]

## 2016-09-08 DIAGNOSIS — I10 Essential (primary) hypertension: Secondary | ICD-10-CM | POA: Insufficient documentation

## 2016-09-27 ENCOUNTER — Ambulatory Visit (INDEPENDENT_AMBULATORY_CARE_PROVIDER_SITE_OTHER): Payer: Medicaid Other | Admitting: Orthopaedic Surgery

## 2016-10-10 ENCOUNTER — Encounter: Payer: Self-pay | Admitting: Podiatry

## 2016-10-10 ENCOUNTER — Ambulatory Visit (INDEPENDENT_AMBULATORY_CARE_PROVIDER_SITE_OTHER): Payer: Medicaid Other | Admitting: Podiatry

## 2016-10-10 VITALS — BP 228/153 | HR 114

## 2016-10-10 DIAGNOSIS — M79674 Pain in right toe(s): Secondary | ICD-10-CM

## 2016-10-10 DIAGNOSIS — B351 Tinea unguium: Secondary | ICD-10-CM | POA: Diagnosis not present

## 2016-10-10 DIAGNOSIS — M79675 Pain in left toe(s): Secondary | ICD-10-CM

## 2016-10-10 NOTE — Progress Notes (Signed)
   Subjective:    Patient ID: Bob Reyes, male    DOB: 08-31-1980, 36 y.o.   MRN: [id number]  HPI W This patient presents today complaining of still nails are extremely thick and elongated:alking wearing shoes in the past 6 months and request toenail debridement. Patient said that he said attempted to trim the toenails, however, not able to do so because the thickness of deformity. Patient is recovering from bilateral foot surgery for traumatic calcaneal fractures   Review of Systems  Musculoskeletal: Positive for gait problem.  All other systems reviewed and are negative.      Objective:   Physical Exam  Pleasant orientated 3  Vascular: DP and PT pulses 2/4 bilaterally Reflex within normal limits bilaterally  Neurological: Sensation to 10 g monofilament wire intact 5/5 bilaterally Vibratory sensation reactive bilaterally Ankle reflexes were equal reactive bilaterally  Dermatological: Well healed surgical scars lateral aspect of the heels bilaterally. No open skin lesions bilaterally The toenails are extremely elongated, discolored, hypertrophic, brittle and tender to direct palpation 6-10  Musculoskeletal: Pes planus bilaterally Limited subtalar joint bilaterally Manual motor testing dorsi flexion, plantar flexion 5/5 bilaterally    Assessment & Plan:   Assessment: Neglected symptomatic onychomycoses 6-10  Plan: Debridement toenails 6-10 mechanical and electrically without any bleeding  Reappoint when necessary at patient's request

## 2016-10-26 ENCOUNTER — Ambulatory Visit (INDEPENDENT_AMBULATORY_CARE_PROVIDER_SITE_OTHER): Payer: Self-pay

## 2016-10-26 ENCOUNTER — Ambulatory Visit (INDEPENDENT_AMBULATORY_CARE_PROVIDER_SITE_OTHER): Payer: Medicaid Other

## 2016-10-26 ENCOUNTER — Encounter (INDEPENDENT_AMBULATORY_CARE_PROVIDER_SITE_OTHER): Payer: Self-pay | Admitting: Orthopaedic Surgery

## 2016-10-26 ENCOUNTER — Ambulatory Visit (INDEPENDENT_AMBULATORY_CARE_PROVIDER_SITE_OTHER): Payer: Medicaid Other | Admitting: Orthopaedic Surgery

## 2016-10-26 DIAGNOSIS — S92902D Unspecified fracture of left foot, subsequent encounter for fracture with routine healing: Secondary | ICD-10-CM | POA: Diagnosis not present

## 2016-10-26 DIAGNOSIS — S92901D Unspecified fracture of right foot, subsequent encounter for fracture with routine healing: Secondary | ICD-10-CM | POA: Diagnosis not present

## 2016-10-26 DIAGNOSIS — M25571 Pain in right ankle and joints of right foot: Secondary | ICD-10-CM

## 2016-10-26 NOTE — Progress Notes (Signed)
Office Visit Note   Patient: Bob Reyes           Date of Birth: 06/07/1980           MRN: [id number] Visit Date: 10/26/2016              Requested by: No referring provider defined for this encounter. PCP: Rometta EmeryGarba, Mohammad L, MD   Assessment & Plan: Visit Diagnoses:  1. Closed fracture of left foot with routine healing, subsequent encounter   2. Closed fracture of right foot with routine healing, subsequent encounter     Plan: continueAmbulation. Bob Reyes grades to resume regular activities Bob Reyes understands all gradual improvement over the next 6 months. His fractures are healed and Bob Reyes is back to community ambulation. Bob Reyes is happy with the surgical result.  Follow-Up Instructions: Return if symptoms worsen or fail to improve.   Orders:  Orders Placed This Encounter  Procedures  . XR Foot 2 Views Left  . XR Foot 2 Views Right   No orders of the defined types were placed in this encounter.     Procedures: No procedures performed   Clinical Data: No additional findings.   Subjective: Chief Complaint  Patient presents with  . Right Foot - Follow-up  . Left Foot - Follow-up    HPI 36 year old male returns Bob Reyes had bilateral calcaneus fractures after Bob Reyes jumped off of the 4 landing on his heels. Bob Reyes had lateral plating both right and left calcaneus on 5/16 and 5/18 2018. Bob Reyes is a mature with a cane Bob Reyes can walk without the cane Bob Reyes has some stiffness in the morning after a little while with these up the stiffness gets better Bob Reyes is not taking any pain medication for his heels at the present time. Patient's on disability and was not working. Bob Reyes is able walk up steps.  Review of Systems review of systems updated and is unchanged from last office visit.   Objective: Vital Signs: BP (!) 151/100   Pulse (!) 121   Ht 6\' 5"  (1.956 m)   Wt 225 lb (102.1 kg)   BMI 26.68 kg/m   Physical Exam  Constitutional: Bob Reyes is oriented to person, place, and time. Bob Reyes appears well-developed and  well-nourished.  HENT:  Head: Normocephalic and atraumatic.  Eyes: Pupils are equal, round, and reactive to light. EOM are normal.  Neck: No tracheal deviation present. No thyromegaly present.  Cardiovascular: Normal rate.   Pulmonary/Chest: Effort normal. Bob Reyes has no wheezes.  Abdominal: Soft. Bowel sounds are normal.  Musculoskeletal:  Well-healed right and left lateral calcaneal incisions without drainage. No prominence of the screws. Bob Reyes ambulates with the heel toe gait. No swelling in the forefoot sensation is intact normal pulses good capillary refill needs reach full extension normal hip range of motion negative Homan.  Neurological: Bob Reyes is alert and oriented to person, place, and time.  Skin: Skin is warm and dry. Capillary refill takes less than 2 seconds.  Psychiatric: Bob Reyes has a normal mood and affect. His behavior is normal. Judgment and thought content normal.    Ortho Exam  Specialty Comments:  No specialty comments available.  Imaging: No results found.   PMFS History: Patient Active Problem List   Diagnosis Date Noted  . Essential hypertension, benign 09/08/2016  . S/P ORIF (open reduction internal fixation) fracture 08/19/2016  . Constipation due to opioid therapy 07/05/2016  . Closed fracture of bone of right foot   . Closed fracture of bone of left foot   .  Paranoid schizophrenia, chronic condition (HCC) 06/28/2016  . Bilateral calcaneal fractures 06/27/2016   Past Medical History:  Diagnosis Date  . Closed fracture of bone of left foot   . Closed fracture of bone of right foot   . Paranoid schizophrenia, chronic condition (HCC) 06/28/2016    No family history on file.  Past Surgical History:  Procedure Laterality Date  . ORIF CALCANEOUS FRACTURE Left 06/29/2016   Procedure: OPEN REDUCTION INTERNAL FIXATION (ORIF) CALCANEOUS FRACTURE;  Surgeon: Eldred Manges, MD;  Location: [facility] OR;  Service: Orthopedics;  Laterality: Left;  . ORIF CALCANEOUS FRACTURE Right  07/01/2016   Procedure: OPEN REDUCTION INTERNAL FIXATION (ORIF) RIGHT CALCANEOUS FRACTURE;  Surgeon: Eldred Manges, MD;  Location: [facility] OR;  Service: Orthopedics;  Laterality: Right;   Social History   Occupational History  . Not on file.   Social History Main Topics  . Smoking status: Never Smoker  . Smokeless tobacco: Never Used  . Alcohol use No  . Drug use: No  . Sexual activity: Not on file

## 2017-01-16 ENCOUNTER — Encounter: Payer: Self-pay | Admitting: Adult Health

## 2017-01-16 ENCOUNTER — Non-Acute Institutional Stay (SKILLED_NURSING_FACILITY): Payer: Medicaid Other | Admitting: Adult Health

## 2017-01-16 DIAGNOSIS — S92001D Unspecified fracture of right calcaneus, subsequent encounter for fracture with routine healing: Secondary | ICD-10-CM

## 2017-01-16 DIAGNOSIS — F200 Paranoid schizophrenia: Secondary | ICD-10-CM | POA: Diagnosis not present

## 2017-01-16 DIAGNOSIS — S92002D Unspecified fracture of left calcaneus, subsequent encounter for fracture with routine healing: Secondary | ICD-10-CM

## 2017-01-16 DIAGNOSIS — R531 Weakness: Secondary | ICD-10-CM | POA: Diagnosis not present

## 2017-01-16 DIAGNOSIS — I10 Essential (primary) hypertension: Secondary | ICD-10-CM | POA: Diagnosis not present

## 2017-01-16 NOTE — Progress Notes (Signed)
Location:   [facility] Room Number: 221 A Place of Service:  SNF (31)   CODE STATUS: Full Code  No Known Allergies  Chief Complaint  Patient presents with  . Acute Visit    Transfer in from home    HPI:  He is being transferred to this facility from home due to increased weakness.  He says he is here due to his left lower extremity fracture and his leg is weak. He is here for short term rehab for the next 30 days. He denies any pain; any shortness of breath any chest pain. There are no nursing concerns at this time.  He will be followed for his chronic illnesses including: hypertension; constipation and weakness.   Past Medical History:  Diagnosis Date  . Closed fracture of bone of left foot   . Closed fracture of bone of right foot   . Paranoid schizophrenia, chronic condition (HCC) 06/28/2016    Past Surgical History:  Procedure Laterality Date  . ORIF CALCANEOUS FRACTURE Left 06/29/2016   Procedure: OPEN REDUCTION INTERNAL FIXATION (ORIF) CALCANEOUS FRACTURE;  Surgeon: Eldred MangesYates, Mark C, MD;  Location: [facility] OR;  Service: Orthopedics;  Laterality: Left;  . ORIF CALCANEOUS FRACTURE Right 07/01/2016   Procedure: OPEN REDUCTION INTERNAL FIXATION (ORIF) RIGHT CALCANEOUS FRACTURE;  Surgeon: Eldred MangesYates, Mark C, MD;  Location: [facility] OR;  Service: Orthopedics;  Laterality: Right;    Social History   Socioeconomic History  . Marital status: Single    Spouse name: Not on file  . Number of children: Not on file  . Years of education: Not on file  . Highest education level: Not on file  Social Needs  . Financial resource strain: Not on file  . Food insecurity - worry: Not on file  . Food insecurity - inability: Not on file  . Transportation needs - medical: Not on file  . Transportation needs - non-medical: Not on file  Occupational History  . Not on file  Tobacco Use  . Smoking status: Never Smoker  . Smokeless tobacco: Never Used  Substance and Sexual Activity  . Alcohol  use: No  . Drug use: No  . Sexual activity: Not on file  Other Topics Concern  . Not on file  Social History Narrative  . Not on file   History reviewed. No pertinent family history.    VITAL SIGNS BP (!) 142/82   Pulse 76   Temp 97.9 F (36.6 C)   Resp 16   Ht 6\' 5"  (1.956 m)   Wt 221 lb 3.2 oz (100.3 kg)   SpO2 98%   BMI 26.23 kg/m    Outpatient Encounter Medications as of 01/16/2017  Medication Sig  . lisinopril (PRINIVIL,ZESTRIL) 10 MG tablet Take 10 mg by mouth daily.  Marland Kitchen. OLANZapine zydis (ZYPREXA) 10 MG disintegrating tablet Take 10 mg by mouth at bedtime.   . [DISCONTINUED] acetaminophen (TYLENOL) 500 MG tablet Take 1,000 mg by mouth every 6 (six) hours as needed.  . [DISCONTINUED] polyethylene glycol (MIRALAX / GLYCOLAX) packet Take 17 g by mouth daily as needed for mild constipation. (Patient not taking: Reported on 10/10/2016)   No facility-administered encounter medications on file as of 01/16/2017.      SIGNIFICANT DIAGNOSTIC EXAMS  PREVIOUS  06-27-16: left foot x-ray: Comminuted calcaneal fracture involving the subtalar joint with soft tissue swelling.   06-27-16: right foot x-ray: Comminuted fracture of the calcaneus not well visualized on this exam. Recommend follow-up CT for further characterization of the  fracture  06-27-16: right tibia/fibula x-ray: 1.  No acute osseous injury of the right tibia and fibula. 2. Severely comminuted fracture of the anterior, mid and posterior calcaneus.   06-27-16: left tibia/fibula x-ray: 1. Complex fracture of the calcaneus.  Recommend CT of the hindfoot. 2. No tibial fracture.  06-27-16: ct of bilateral feet: 1. Acute, closed, comminuted Sanders type 3 AC fracture the calcaneus with intra-articular involvement of the subtalar and calcaneocuboid articulations. Approximately 5 mm depression is noted of the central calcaneal fracture fragment. 2. Acute, closed, comminuted Sanders type 3 AB fracture of the calcaneus with  intra-articular extension into the subtalar and calcaneocuboid articulations and also demonstrating 5 mm of depression along the medial aspect of the main central calcaneal fracture fragment.  NO NEW EXAMS  LABS REVIEWED:  PREVIOUS  06-27-16: wbc 18.2; hgb 16.1; hct 45.3; mcv 83.1; plt 271; glucose 77; bun 12; creat 1.22; k+ 3.6; na++ 139; ca 9.7; HIV: nr 06-29-16: total bili 1.6; indirect bili: 1.5; albumin 4.0; chol 159; ldl 90; trig 179; hdl 33 07-02-16: glucose 119; bun 13; creat 1.01; k+ 3.7; na++ 138; ca 8.6  08-31-16: glucose 103; bun 14.9; creat 1.00; k+ 4.1; na++ 141; ca 9.0   NO NEW LABS     Review of Systems  Constitutional: Negative for malaise/fatigue.  Respiratory: Negative for cough and shortness of breath.   Cardiovascular: Negative for chest pain, palpitations and leg swelling.  Gastrointestinal: Negative for abdominal pain, constipation and heartburn.  Musculoskeletal: Negative for back pain, joint pain and myalgias.  Skin: Negative.   Neurological: Negative for dizziness.  Psychiatric/Behavioral: The patient is not nervous/anxious and does not have insomnia.     Physical Exam  Constitutional: He is oriented to person, place, and time. He appears well-developed and well-nourished. No distress.  Neck: Neck supple. No thyromegaly present.  Cardiovascular: Normal rate, regular rhythm, normal heart sounds and intact distal pulses.  Pulmonary/Chest: Effort normal and breath sounds normal. No respiratory distress.  Abdominal: Soft. Bowel sounds are normal. He exhibits no distension. There is no tenderness.  Musculoskeletal: Normal range of motion. He exhibits no edema.  Lymphadenopathy:    He has no cervical adenopathy.  Neurological: He is alert and oriented to person, place, and time.  Skin: Skin is warm and dry. He is not diaphoretic.  Has tattoos   Psychiatric: He has a normal mood and affect.    ASSESSMENT/ PLAN:  TODAY:   1. Hypertension: stable b/p 142/82:  will continue lisinopril 10 mg daily   2. Paranoid schizophrenia: emotionally stable: will continue zyprexa 10 mg nightly   3. Physical deconditioning: is here for short term rehab for weakness: is status post bilateral calcaneal fractures in May of this year. will continue therapy as indicated to improve upon strength and independence with his adls.  4. Constipation: is stable is presently not on medications will monitor   MD is aware of resident's narcotic use and is in agreement with current plan of care. We will attempt to wean resident as apropriate   Synthia Innocenteborah Green NP [facility]  Contact [phone] Monday through Friday 8am- 5pm  After hours call [phone]

## 2017-01-19 ENCOUNTER — Encounter: Payer: Self-pay | Admitting: Internal Medicine

## 2017-01-19 ENCOUNTER — Non-Acute Institutional Stay (SKILLED_NURSING_FACILITY): Payer: Medicaid Other | Admitting: Internal Medicine

## 2017-01-19 DIAGNOSIS — I10 Essential (primary) hypertension: Secondary | ICD-10-CM | POA: Diagnosis not present

## 2017-01-19 DIAGNOSIS — F200 Paranoid schizophrenia: Secondary | ICD-10-CM

## 2017-01-19 DIAGNOSIS — Z967 Presence of other bone and tendon implants: Secondary | ICD-10-CM | POA: Diagnosis not present

## 2017-01-19 DIAGNOSIS — Z9889 Other specified postprocedural states: Secondary | ICD-10-CM

## 2017-01-19 DIAGNOSIS — R5381 Other malaise: Secondary | ICD-10-CM | POA: Diagnosis not present

## 2017-01-19 DIAGNOSIS — Z8781 Personal history of (healed) traumatic fracture: Secondary | ICD-10-CM | POA: Diagnosis not present

## 2017-01-19 NOTE — Progress Notes (Deleted)
Provider:  DR Elmon Kirschner Location:  [facility] Room Number: 221 [id number] Place of Service:  SNF ([id number])  PCP: Rometta Emery, MD Patient Care Team: Rometta Emery, MD as PCP - General (Internal Medicine)  Extended Emergency Contact Information Primary Emergency Contact: Hunter,Tia Address: [address]          [place], [place] [address] D[place] of [place] Home Phone: [phone] Relation: Mother  Code Status: *** Goals of Care: Advanced Directive information Advanced Directives 01/19/2017  Does Patient Have a Medical Advance Directive? No  Type of Advance Directive -  Does patient want to make changes to medical advance directive? -  Would patient like information on creating a medical advance directive? No - Patient declined  Pre-existing out of facility DNR order (yellow form or pink MOST form) -      Chief Complaint  Patient presents with  . New Admit To SNF    New Admit to [facility]    HPI: Patient is a 36 y.o. male seen today for admission to  Past Medical History:  Diagnosis Date  . Closed fracture of bone of left foot   . Closed fracture of bone of right foot   . Constipation due to opioid therapy 07/05/2016  . Paranoid schizophrenia, chronic condition (HCC) 06/28/2016   Past Surgical History:  Procedure Laterality Date  . ORIF CALCANEOUS FRACTURE Left 06/29/2016   Procedure: OPEN REDUCTION INTERNAL FIXATION (ORIF) CALCANEOUS FRACTURE;  Surgeon: Eldred Manges, MD;  Location: [facility] OR;  Service: Orthopedics;  Laterality: Left;  . ORIF CALCANEOUS FRACTURE Right 07/01/2016   Procedure: OPEN REDUCTION INTERNAL FIXATION (ORIF) RIGHT CALCANEOUS FRACTURE;  Surgeon: Eldred Manges, MD;  Location: [facility] OR;  Service: Orthopedics;  Laterality: Right;    reports that  has never smoked. he has never used smokeless tobacco. He reports that he does not drink alcohol or use drugs. Social History   Socioeconomic History  . Marital status: Single   Spouse name: Not on file  . Number of children: Not on file  . Years of education: Not on file  . Highest education level: Not on file  Social Needs  . Financial resource strain: Not on file  . Food insecurity - worry: Not on file  . Food insecurity - inability: Not on file  . Transportation needs - medical: Not on file  . Transportation needs - non-medical: Not on file  Occupational History  . Not on file  Tobacco Use  . Smoking status: Never Smoker  . Smokeless tobacco: Never Used  Substance and Sexual Activity  . Alcohol use: No  . Drug use: No  . Sexual activity: Not on file  Other Topics Concern  . Not on file  Social History Narrative  . Not on file    Functional Status Survey:    History reviewed. No pertinent family history.  Health Maintenance  Topic Date Due  . INFLUENZA VACCINE  02/16/2017 (Originally 09/14/2016)  . TETANUS/TDAP  08/30/2017 (Originally 04/02/1998)  . HIV Screening  Completed    No Known Allergies  Outpatient Encounter Medications as of 01/19/2017  Medication Sig  . lisinopril (PRINIVIL,ZESTRIL) 10 MG tablet Take 10 mg by mouth daily.  Marland Kitchen OLANZapine zydis (ZYPREXA) 10 MG disintegrating tablet Take 10 mg by mouth at bedtime.    No facility-administered encounter medications on file as of 01/19/2017.     Review of Systems  Vitals:   01/19/17 1150  BP: 126/74   There  is no height or weight on file to calculate BMI. Physical Exam  Labs reviewed: Basic Metabolic Panel: Recent Labs    06/30/16 0546 07/01/16 0432 07/02/16 0539 08/31/16  NA 136 137 138 141  K 3.5 3.4* 3.7 4.1  CL 98* 102 103  --   CO2 26 26 26   --   GLUCOSE 119* 110* 119*  --   BUN 11 15 13 15   CREATININE 1.14 1.09 1.01 1.0  CALCIUM 9.0 8.6* 8.6*  --    Liver Function Tests: Recent Labs    06/29/16 0654  AST 25  ALT 31  ALKPHOS 70  BILITOT 1.7*  PROT 7.0  ALBUMIN 4.0   No results for input(s): LIPASE, AMYLASE in the last 8760 hours. No results for  input(s): AMMONIA in the last 8760 hours. CBC: Recent Labs    06/27/16 1837 06/27/16 2106 07/06/16 08/03/16  WBC 18.2*  --  14.5 9.1  NEUTROABS  --  12.7* 10 6  HGB 16.1  --  14.4 15.0  HCT 45.3  --  42 44  MCV 83.1  --   --   --   PLT 271  --  366 215   Cardiac Enzymes: No results for input(s): CKTOTAL, CKMB, CKMBINDEX, TROPONINI in the last 8760 hours. BNP: Invalid input(s): POCBNP No results found for: HGBA1C Lab Results  Component Value Date   TSH 1.959 ***Test methodology is 3rd generation TSH*** 04/28/2007   No results found for: VITAMINB12 No results found for: FOLATE No results found for: IRON, TIBC, FERRITIN  Imaging and Procedures obtained prior to SNF admission: Dg Lumbar Spine Complete  Result Date: 06/27/2016 CLINICAL DATA:  Jumped from a 9 foot balcony. Lower extremity numbness and pain. EXAM: LUMBAR SPINE - COMPLETE 4+ VIEW COMPARISON:  None. FINDINGS: Five non rib-bearing lumbar-type vertebral bodies are intact and aligned with maintenance of the lumbar lordosis. Intervertebral disc heights are normal. No destructive bony lesions. Sacroiliac joints are symmetric. Included prevertebral and paraspinal soft tissue planes are non-suspicious. IMPRESSION: Negative. Electronically Signed   By: Awilda Metroourtnay  Bloomer M.D.   On: 06/27/2016 17:49   Dg Tibia/fibula Left  Result Date: 06/27/2016 CLINICAL DATA:  Patient reports jumping from a 529ft balcony, reports immediately after his left and right tib/fib were numb, reports now his pain is distal to both of his tib/fibs. EXAM: LEFT TIBIA AND FIBULA - 2 VIEW COMPARISON:  None. FINDINGS: No fracture of the tibia or fibula. Knee joint and ankle joint appear normal on two views. On the lateral view there is a fracture of the calcaneus which is complex involving the posterior calcaneus as well as the neck of the calcaneus with depression of the subtalar surface. IMPRESSION: 1. Complex fracture of the calcaneus.  Recommend CT of the  hindfoot. 2. No tibial fracture. Electronically Signed   By: Genevive BiStewart  Edmunds M.D.   On: 06/27/2016 16:13   Dg Tibia/fibula Right  Result Date: 06/27/2016 CLINICAL DATA:  Patient reports jumping from a 129ft balcony, reports immediately after his left and right tib/fib were numb, reports now his pain is distal to both of his tib/fibs. EXAM: RIGHT TIBIA AND FIBULA - 2 VIEW COMPARISON:  None. FINDINGS: No acute fracture or dislocation of the right tibia or fibula. No knee joint effusion. Severely comminuted fracture of the anterior, mid and posterior right calcaneus. Relative widening of the posterior subtalar joint. IMPRESSION: 1.  No acute osseous injury of the right tibia and fibula. 2. Severely comminuted fracture of the anterior,  mid and posterior calcaneus. Electronically Signed   By: Elige Ko   On: 06/27/2016 16:09   Ct Foot Left Wo Contrast  Result Date: 06/27/2016 CLINICAL DATA:  Bilateral foot pain after jumping out from balcony. EXAM: CT OF THE LEFT FOOT WITHOUT CONTRAST CT OF THE RIGHT FOOT WITHOUT CONTRAST TECHNIQUE: Multidetector CT imaging of the both feet were performed according to the standard protocol. Multiplanar CT image reconstructions were also generated. COMPARISON:  Same day radiographs of the feet FINDINGS: RIGHT FOOT: An acute, comminuted Sanders type 3 AC fracture of the calcaneus with intra-articular involvement of the posterior articular facet of the subtalar joint is noted with depressed middle component up to 5 mm. Fracture lucencies also extend into the calcaneocuboid articulation as well as the sinus tarsi without encroachment. Ligaments Suboptimally assessed by CT. Muscles and Tendons No intramuscular hemorrhage. The peroneal tendons are intact without encroachment or entrapment. Medial tendons crossing the ankle joint are without definite entrapment. Extensor tendons crossing the ankle joint are unremarkable. Soft tissues Expected soft tissue swelling about the ankle and  hindfoot secondary to the comminuted calcaneal fracture. No abnormal fluid collections. LEFT FOOT: An acute, comminuted Sanders type 3 AB fracture of the calcaneus is noted with intra-articular extension into the subtalar and calcaneocuboid articulations. Slight varus deformity of the calcaneus secondary to the fracture, series 12, image 162. The main fracture fragment is slightly depressed on the coronal images along its medial aspect by 5 mm as well. Remainder of the visualized mid foot, metatarsals and toes are intact. The distal tibia and fibula are unremarkable. The ankle mortise is congruent. Ligaments Suboptimally assessed by CT. Muscle and tendons The medial and lateral tendons crossing the ankle joint do not appear entrapped by the fracture fragments. Soft tissues: Expected soft tissue swelling about the ankle and hindfoot secondary to the comminuted calcaneal fractures. IMPRESSION: 1. Acute, closed, comminuted Sanders type 3 AC fracture the calcaneus with intra-articular involvement of the subtalar and calcaneocuboid articulations. Approximately 5 mm depression is noted of the central calcaneal fracture fragment. 2. Acute, closed, comminuted Sanders type 3 AB fracture of the calcaneus with intra-articular extension into the subtalar and calcaneocuboid articulations and also demonstrating 5 mm of depression along the medial aspect of the main central calcaneal fracture fragment. Electronically Signed   By: Tollie Eth M.D.   On: 06/27/2016 19:19   Ct Foot Right Wo Contrast  Result Date: 06/27/2016 CLINICAL DATA:  Bilateral foot pain after jumping out from balcony. EXAM: CT OF THE LEFT FOOT WITHOUT CONTRAST CT OF THE RIGHT FOOT WITHOUT CONTRAST TECHNIQUE: Multidetector CT imaging of the both feet were performed according to the standard protocol. Multiplanar CT image reconstructions were also generated. COMPARISON:  Same day radiographs of the feet FINDINGS: RIGHT FOOT: An acute, comminuted Sanders type  3 AC fracture of the calcaneus with intra-articular involvement of the posterior articular facet of the subtalar joint is noted with depressed middle component up to 5 mm. Fracture lucencies also extend into the calcaneocuboid articulation as well as the sinus tarsi without encroachment. Ligaments Suboptimally assessed by CT. Muscles and Tendons No intramuscular hemorrhage. The peroneal tendons are intact without encroachment or entrapment. Medial tendons crossing the ankle joint are without definite entrapment. Extensor tendons crossing the ankle joint are unremarkable. Soft tissues Expected soft tissue swelling about the ankle and hindfoot secondary to the comminuted calcaneal fracture. No abnormal fluid collections. LEFT FOOT: An acute, comminuted Sanders type 3 AB fracture of the calcaneus is noted with  intra-articular extension into the subtalar and calcaneocuboid articulations. Slight varus deformity of the calcaneus secondary to the fracture, series 12, image 162. The main fracture fragment is slightly depressed on the coronal images along its medial aspect by 5 mm as well. Remainder of the visualized mid foot, metatarsals and toes are intact. The distal tibia and fibula are unremarkable. The ankle mortise is congruent. Ligaments Suboptimally assessed by CT. Muscle and tendons The medial and lateral tendons crossing the ankle joint do not appear entrapped by the fracture fragments. Soft tissues: Expected soft tissue swelling about the ankle and hindfoot secondary to the comminuted calcaneal fractures. IMPRESSION: 1. Acute, closed, comminuted Sanders type 3 AC fracture the calcaneus with intra-articular involvement of the subtalar and calcaneocuboid articulations. Approximately 5 mm depression is noted of the central calcaneal fracture fragment. 2. Acute, closed, comminuted Sanders type 3 AB fracture of the calcaneus with intra-articular extension into the subtalar and calcaneocuboid articulations and also  demonstrating 5 mm of depression along the medial aspect of the main central calcaneal fracture fragment. Electronically Signed   By: Tollie Ethavid  Kwon M.D.   On: 06/27/2016 19:19   Dg Foot Complete Left  Result Date: 06/27/2016 CLINICAL DATA:  Jumping from a balcony, left foot pain EXAM: LEFT FOOT - COMPLETE 3+ VIEW COMPARISON:  06/27/2016 FINDINGS: There is an acute comminuted fracture of the calcaneus extending into the subtalar joint articular surface. Lateral view is oblique but the calcaneus is severely flattened and deformed. The talus appears impacted upon the fractured calcaneus. No subluxation or dislocation. Diffuse soft tissue swelling. IMPRESSION: Comminuted calcaneal fracture involving the subtalar joint with soft tissue swelling. Electronically Signed   By: Judie PetitM.  Shick M.D.   On: 06/27/2016 16:16   Dg Foot Complete Right  Result Date: 06/27/2016 CLINICAL DATA:  Patient reports jumping from a 489ft balcony, reports immediately after his left and right tib/fib were numb, reports now his pain is distal to both of his tib/fibs. EXAM: RIGHT FOOT COMPLETE - 3+ VIEW COMPARISON:  None. FINDINGS: There is a comminuted fracture of the calcaneus, not well-defined due to positioning on the lateral view. This appears have an intra-articular component. No other fractures. The calcaneal articulations are not well visualized in this exam. Remaining foot articulations are normally spaced and aligned. There is diffuse mid and hindfoot soft tissue swelling. IMPRESSION: Comminuted fracture of the calcaneus not well visualized on this exam. Recommend follow-up CT for further characterization of the fracture. Electronically Signed   By: Amie Portlandavid  Ormond M.D.   On: 06/27/2016 16:12    Assessment/Plan    Family/ staff Communication:   Labs/tests ordered:    Bertis RuddyMonica S. Ancil Linseyarter, D. O., F. A. C. O. I.  [facility] [address] [place], [place] [address] [phone] Cell (Monday-Friday  8 AM - 5 PM) [phone] After 5 PM and follow prompts

## 2017-01-19 NOTE — Progress Notes (Signed)
Patient ID: Bob GladdenJosh M Reyes, male   DOB: 06-22-1980, 36 y.o.   MRN: [id number]   Provider:  DR Elmon KirschnerMONICA S Markeita Alicia Location:  [facility] Room Number: 221 A Place of Service:  SNF (31)  PCP: Rometta EmeryGarba, Mohammad L, MD Patient Care Team: Rometta EmeryGarba, Mohammad L, MD as PCP - General (Internal Medicine)  Extended Emergency Contact Information Primary Emergency Contact: Hunter,Tia Address: [address]          [place], [place] [address] [place] Home Phone: [phone] Relation: Mother  Code Status: FULL CODE Goals of Care: Advanced Directive information Advanced Directives 01/19/2017  Does Patient Have a Medical Advance Directive? No  Type of Advance Directive -  Does patient want to make changes to medical advance directive? -  Would patient like information on creating a medical advance directive? No - Patient declined  Pre-existing out of facility DNR order (yellow form or pink MOST form) -    Chief Complaint  Patient presents with  . New Admit To SNF    New Admit to [facility]    HPI: Patient is a 36 y.o. male seen today for admission into SNF from home for deconditioning. He states weakness and stiffness in LE improving with therapy. No falls. Home visit planned with therapy tomorrow. He is a poor historian due to psych d/o. Hx obtained from chart.  Hypertension- BP stable on lisinopril 10 mg daily   Paranoid schizophrenia - mood stable on zyprexa 10 mg nightly. He does benefit from this regimen. No SI/HI  S/p ORIF b/l calcaneal fractures (Jun 29, 2016) - improving strength with PT/OT. Last seen by Ortho in Sept 2018 and ORIF sites healed. He was released from care.  Past Medical History:  Diagnosis Date  . Closed fracture of bone of left foot   . Closed fracture of bone of right foot   . Constipation due to opioid therapy 07/05/2016  . Paranoid schizophrenia, chronic condition (HCC) 06/28/2016   Past Surgical History:  Procedure Laterality Date    . ORIF CALCANEOUS FRACTURE Left 06/29/2016   Procedure: OPEN REDUCTION INTERNAL FIXATION (ORIF) CALCANEOUS FRACTURE;  Surgeon: Eldred MangesYates, Mark C, MD;  Location: [facility] OR;  Service: Orthopedics;  Laterality: Left;  . ORIF CALCANEOUS FRACTURE Right 07/01/2016   Procedure: OPEN REDUCTION INTERNAL FIXATION (ORIF) RIGHT CALCANEOUS FRACTURE;  Surgeon: Eldred MangesYates, Mark C, MD;  Location: [facility] OR;  Service: Orthopedics;  Laterality: Right;    reports that  has never smoked. he has never used smokeless tobacco. He reports that he does not drink alcohol or use drugs. Social History   Socioeconomic History  . Marital status: Single    Spouse name: Not on file  . Number of children: Not on file  . Years of education: Not on file  . Highest education level: Not on file  Social Needs  . Financial resource strain: Not on file  . Food insecurity - worry: Not on file  . Food insecurity - inability: Not on file  . Transportation needs - medical: Not on file  . Transportation needs - non-medical: Not on file  Occupational History  . Not on file  Tobacco Use  . Smoking status: Never Smoker  . Smokeless tobacco: Never Used  Substance and Sexual Activity  . Alcohol use: No  . Drug use: No  . Sexual activity: Not on file  Other Topics Concern  . Not on file  Social History Narrative  . Not on file    History  reviewed. No pertinent family history.  Health Maintenance  Topic Date Due  . INFLUENZA VACCINE  02/16/2017 (Originally 09/14/2016)  . TETANUS/TDAP  08/30/2017 (Originally 04/02/1998)  . HIV Screening  Completed    No Known Allergies  Outpatient Encounter Medications as of 01/19/2017  Medication Sig  . lisinopril (PRINIVIL,ZESTRIL) 10 MG tablet Take 10 mg by mouth daily.  Marland Kitchen. OLANZapine zydis (ZYPREXA) 10 MG disintegrating tablet Take 10 mg by mouth at bedtime.    No facility-administered encounter medications on file as of 01/19/2017.     Review of Systems  Unable to perform ROS: Psychiatric disorder     Vitals:   01/19/17 1150  BP: 126/74  Pulse: 76  Resp: 16  Temp: 97.9 F (36.6 C)  TempSrc: Oral  SpO2: 98%  Weight: 221 lb 1.9 oz (100.3 kg)  Height: 6\' 5"  (1.956 m)   Body mass index is 26.22 kg/m. Physical Exam  Constitutional: He is oriented to person, place, and time. He appears well-developed and well-nourished.  HENT:  Mouth/Throat: Oropharynx is clear and moist.  MMM; no oral thrush  Eyes: Pupils are equal, round, and reactive to light. No scleral icterus.  Neck: Neck supple. Carotid bruit is not present. No thyromegaly present.  Cardiovascular: Regular rhythm, normal heart sounds and intact distal pulses. Tachycardia present. Exam reveals no gallop and no friction rub.  No murmur heard. Trace R>L ankle swelling. No calf TTP  Pulmonary/Chest: Effort normal and breath sounds normal. He has no wheezes. He has no rales. He exhibits no tenderness.  Abdominal: Soft. Normal appearance and bowel sounds are normal. He exhibits no distension, no abdominal bruit, no pulsatile midline mass and no mass. There is no hepatomegaly. There is no tenderness. There is no rigidity, no rebound and no guarding. No hernia.  Musculoskeletal: He exhibits edema.  Decreased eversion/inversion right ankle  Lymphadenopathy:    He has no cervical adenopathy.  Neurological: He is alert and oriented to person, place, and time. He has normal reflexes.  Strength 4/5 RLE; 5/5 in LLE  Skin: Skin is warm and dry. No rash noted.  Tattoos present  Psychiatric: He has a normal mood and affect. His behavior is normal. Thought content normal.    Labs reviewed: Basic Metabolic Panel: Recent Labs    06/30/16 0546 07/01/16 0432 07/02/16 0539 08/31/16  NA 136 137 138 141  K 3.5 3.4* 3.7 4.1  CL 98* 102 103  --   CO2 26 26 26   --   GLUCOSE 119* 110* 119*  --   BUN 11 15 13 15   CREATININE 1.14 1.09 1.01 1.0  CALCIUM 9.0 8.6* 8.6*  --    Liver Function Tests: Recent Labs    06/29/16 0654  AST 25   ALT 31  ALKPHOS 70  BILITOT 1.7*  PROT 7.0  ALBUMIN 4.0   No results for input(s): LIPASE, AMYLASE in the last 8760 hours. No results for input(s): AMMONIA in the last 8760 hours. CBC: Recent Labs    06/27/16 1837 06/27/16 2106 07/06/16 08/03/16  WBC 18.2*  --  14.5 9.1  NEUTROABS  --  12.7* 10 6  HGB 16.1  --  14.4 15.0  HCT 45.3  --  42 44  MCV 83.1  --   --   --   PLT 271  --  366 215   Cardiac Enzymes: No results for input(s): CKTOTAL, CKMB, CKMBINDEX, TROPONINI in the last 8760 hours. BNP: Invalid input(s): POCBNP No results found for: HGBA1C Lab  Results  Component Value Date   TSH 1.959 Test methodology is 3rd generation TSH 04/28/2007   No results found for: VITAMINB12 No results found for: FOLATE No results found for: IRON, TIBC, FERRITIN  Imaging and Procedures obtained prior to SNF admission: Dg Lumbar Spine Complete  Result Date: 06/27/2016 CLINICAL DATA:  Jumped from a 9 foot balcony. Lower extremity numbness and pain. EXAM: LUMBAR SPINE - COMPLETE 4+ VIEW COMPARISON:  None. FINDINGS: Five non rib-bearing lumbar-type vertebral bodies are intact and aligned with maintenance of the lumbar lordosis. Intervertebral disc heights are normal. No destructive bony lesions. Sacroiliac joints are symmetric. Included prevertebral and paraspinal soft tissue planes are non-suspicious. IMPRESSION: Negative. Electronically Signed   By: Awilda Metro M.D.   On: 06/27/2016 17:49   Dg Tibia/fibula Left  Result Date: 06/27/2016 CLINICAL DATA:  Patient reports jumping from a 36ft balcony, reports immediately after his left and right tib/fib were numb, reports now his pain is distal to both of his tib/fibs. EXAM: LEFT TIBIA AND FIBULA - 2 VIEW COMPARISON:  None. FINDINGS: No fracture of the tibia or fibula. Knee joint and ankle joint appear normal on two views. On the lateral view there is a fracture of the calcaneus which is complex involving the posterior calcaneus as well as the  neck of the calcaneus with depression of the subtalar surface. IMPRESSION: 1. Complex fracture of the calcaneus.  Recommend CT of the hindfoot. 2. No tibial fracture. Electronically Signed   By: Genevive Bi M.D.   On: 06/27/2016 16:13   Dg Tibia/fibula Right  Result Date: 06/27/2016 CLINICAL DATA:  Patient reports jumping from a 11ft balcony, reports immediately after his left and right tib/fib were numb, reports now his pain is distal to both of his tib/fibs. EXAM: RIGHT TIBIA AND FIBULA - 2 VIEW COMPARISON:  None. FINDINGS: No acute fracture or dislocation of the right tibia or fibula. No knee joint effusion. Severely comminuted fracture of the anterior, mid and posterior right calcaneus. Relative widening of the posterior subtalar joint. IMPRESSION: 1.  No acute osseous injury of the right tibia and fibula. 2. Severely comminuted fracture of the anterior, mid and posterior calcaneus. Electronically Signed   By: Elige Ko   On: 06/27/2016 16:09   Ct Foot Left Wo Contrast  Result Date: 06/27/2016 CLINICAL DATA:  Bilateral foot pain after jumping out from balcony. EXAM: CT OF THE LEFT FOOT WITHOUT CONTRAST CT OF THE RIGHT FOOT WITHOUT CONTRAST TECHNIQUE: Multidetector CT imaging of the both feet were performed according to the standard protocol. Multiplanar CT image reconstructions were also generated. COMPARISON:  Same day radiographs of the feet FINDINGS: RIGHT FOOT: An acute, comminuted Sanders type 3 AC fracture of the calcaneus with intra-articular involvement of the posterior articular facet of the subtalar joint is noted with depressed middle component up to 5 mm. Fracture lucencies also extend into the calcaneocuboid articulation as well as the sinus tarsi without encroachment. Ligaments Suboptimally assessed by CT. Muscles and Tendons No intramuscular hemorrhage. The peroneal tendons are intact without encroachment or entrapment. Medial tendons crossing the ankle joint are without definite  entrapment. Extensor tendons crossing the ankle joint are unremarkable. Soft tissues Expected soft tissue swelling about the ankle and hindfoot secondary to the comminuted calcaneal fracture. No abnormal fluid collections. LEFT FOOT: An acute, comminuted Sanders type 3 AB fracture of the calcaneus is noted with intra-articular extension into the subtalar and calcaneocuboid articulations. Slight varus deformity of the calcaneus secondary to the fracture, series  12, image 162. The main fracture fragment is slightly depressed on the coronal images along its medial aspect by 5 mm as well. Remainder of the visualized mid foot, metatarsals and toes are intact. The distal tibia and fibula are unremarkable. The ankle mortise is congruent. Ligaments Suboptimally assessed by CT. Muscle and tendons The medial and lateral tendons crossing the ankle joint do not appear entrapped by the fracture fragments. Soft tissues: Expected soft tissue swelling about the ankle and hindfoot secondary to the comminuted calcaneal fractures. IMPRESSION: 1. Acute, closed, comminuted Sanders type 3 AC fracture the calcaneus with intra-articular involvement of the subtalar and calcaneocuboid articulations. Approximately 5 mm depression is noted of the central calcaneal fracture fragment. 2. Acute, closed, comminuted Sanders type 3 AB fracture of the calcaneus with intra-articular extension into the subtalar and calcaneocuboid articulations and also demonstrating 5 mm of depression along the medial aspect of the main central calcaneal fracture fragment. Electronically Signed   By: Tollie Eth M.D.   On: 06/27/2016 19:19   Ct Foot Right Wo Contrast  Result Date: 06/27/2016 CLINICAL DATA:  Bilateral foot pain after jumping out from balcony. EXAM: CT OF THE LEFT FOOT WITHOUT CONTRAST CT OF THE RIGHT FOOT WITHOUT CONTRAST TECHNIQUE: Multidetector CT imaging of the both feet were performed according to the standard protocol. Multiplanar CT image  reconstructions were also generated. COMPARISON:  Same day radiographs of the feet FINDINGS: RIGHT FOOT: An acute, comminuted Sanders type 3 AC fracture of the calcaneus with intra-articular involvement of the posterior articular facet of the subtalar joint is noted with depressed middle component up to 5 mm. Fracture lucencies also extend into the calcaneocuboid articulation as well as the sinus tarsi without encroachment. Ligaments Suboptimally assessed by CT. Muscles and Tendons No intramuscular hemorrhage. The peroneal tendons are intact without encroachment or entrapment. Medial tendons crossing the ankle joint are without definite entrapment. Extensor tendons crossing the ankle joint are unremarkable. Soft tissues Expected soft tissue swelling about the ankle and hindfoot secondary to the comminuted calcaneal fracture. No abnormal fluid collections. LEFT FOOT: An acute, comminuted Sanders type 3 AB fracture of the calcaneus is noted with intra-articular extension into the subtalar and calcaneocuboid articulations. Slight varus deformity of the calcaneus secondary to the fracture, series 12, image 162. The main fracture fragment is slightly depressed on the coronal images along its medial aspect by 5 mm as well. Remainder of the visualized mid foot, metatarsals and toes are intact. The distal tibia and fibula are unremarkable. The ankle mortise is congruent. Ligaments Suboptimally assessed by CT. Muscle and tendons The medial and lateral tendons crossing the ankle joint do not appear entrapped by the fracture fragments. Soft tissues: Expected soft tissue swelling about the ankle and hindfoot secondary to the comminuted calcaneal fractures. IMPRESSION: 1. Acute, closed, comminuted Sanders type 3 AC fracture the calcaneus with intra-articular involvement of the subtalar and calcaneocuboid articulations. Approximately 5 mm depression is noted of the central calcaneal fracture fragment. 2. Acute, closed, comminuted  Sanders type 3 AB fracture of the calcaneus with intra-articular extension into the subtalar and calcaneocuboid articulations and also demonstrating 5 mm of depression along the medial aspect of the main central calcaneal fracture fragment. Electronically Signed   By: Tollie Eth M.D.   On: 06/27/2016 19:19   Dg Foot Complete Left  Result Date: 06/27/2016 CLINICAL DATA:  Jumping from a balcony, left foot pain EXAM: LEFT FOOT - COMPLETE 3+ VIEW COMPARISON:  06/27/2016 FINDINGS: There is an acute comminuted fracture  of the calcaneus extending into the subtalar joint articular surface. Lateral view is oblique but the calcaneus is severely flattened and deformed. The talus appears impacted upon the fractured calcaneus. No subluxation or dislocation. Diffuse soft tissue swelling. IMPRESSION: Comminuted calcaneal fracture involving the subtalar joint with soft tissue swelling. Electronically Signed   By: Judie Petit.  Shick M.D.   On: 06/27/2016 16:16   Dg Foot Complete Right  Result Date: 06/27/2016 CLINICAL DATA:  Patient reports jumping from a 57ft balcony, reports immediately after his left and right tib/fib were numb, reports now his pain is distal to both of his tib/fibs. EXAM: RIGHT FOOT COMPLETE - 3+ VIEW COMPARISON:  None. FINDINGS: There is a comminuted fracture of the calcaneus, not well-defined due to positioning on the lateral view. This appears have an intra-articular component. No other fractures. The calcaneal articulations are not well visualized in this exam. Remaining foot articulations are normally spaced and aligned. There is diffuse mid and hindfoot soft tissue swelling. IMPRESSION: Comminuted fracture of the calcaneus not well visualized on this exam. Recommend follow-up CT for further characterization of the fracture. Electronically Signed   By: Amie Portland M.D.   On: 06/27/2016 16:12    Assessment/Plan   ICD-10-CM   1. Physical deconditioning R53.81   2. Essential hypertension, benign I10     3. Paranoid schizophrenia, chronic condition (HCC) F20.0   4. S/P ORIF (open reduction internal fixation) fracture Z96.7    Z87.81    of b/l calcaneal fx (May 2018)    Cont current meds as ordered  PT/OT as ordered  F/u with Ortho as indicated. Last visit in Sept 2018 he was released from care  GOAL: short term rehab and d/c home when medically appropriate. Communicated with pt and nursing.  Will follow   Kaye Luoma S. Ancil Linsey  [facility] [address] [place], [place] [address] [phone] Cell (Monday-Friday 8 AM - 5 PM) [phone] After 5 PM and follow prompts

## 2017-02-15 ENCOUNTER — Encounter: Payer: Self-pay | Admitting: Adult Health

## 2017-02-15 ENCOUNTER — Non-Acute Institutional Stay (SKILLED_NURSING_FACILITY): Payer: Medicaid Other | Admitting: Adult Health

## 2017-02-15 DIAGNOSIS — S92002D Unspecified fracture of left calcaneus, subsequent encounter for fracture with routine healing: Secondary | ICD-10-CM

## 2017-02-15 DIAGNOSIS — I10 Essential (primary) hypertension: Secondary | ICD-10-CM | POA: Diagnosis not present

## 2017-02-15 DIAGNOSIS — F200 Paranoid schizophrenia: Secondary | ICD-10-CM | POA: Diagnosis not present

## 2017-02-15 DIAGNOSIS — S92001D Unspecified fracture of right calcaneus, subsequent encounter for fracture with routine healing: Secondary | ICD-10-CM

## 2017-02-15 NOTE — Progress Notes (Signed)
Location:   [facility] Room Number: 221 A Place of Service:  SNF (31)    CODE STATUS: Full Code  No Known Allergies  Chief Complaint  Patient presents with  . Discharge Note    Discharging to home on 02/16/17    HPI:  He is being discharged to home. He will not receive home health due to payer issues. He will not need any dme. He will need his prescriptions written and will need to follow up with his medical provider He had been admitted to this facility for short term rehab due to weakness and is now ready for discharge.    Past Medical History:  Diagnosis Date  . Closed fracture of bone of left foot   . Closed fracture of bone of right foot   . Constipation due to opioid therapy 07/05/2016  . Paranoid schizophrenia, chronic condition (HCC) 06/28/2016    Past Surgical History:  Procedure Laterality Date  . ORIF CALCANEOUS FRACTURE Left 06/29/2016   Procedure: OPEN REDUCTION INTERNAL FIXATION (ORIF) CALCANEOUS FRACTURE;  Surgeon: Eldred Manges, MD;  Location: [facility] OR;  Service: Orthopedics;  Laterality: Left;  . ORIF CALCANEOUS FRACTURE Right 07/01/2016   Procedure: OPEN REDUCTION INTERNAL FIXATION (ORIF) RIGHT CALCANEOUS FRACTURE;  Surgeon: Eldred Manges, MD;  Location: [facility] OR;  Service: Orthopedics;  Laterality: Right;    Social History   Socioeconomic History  . Marital status: Single    Spouse name: Not on file  . Number of children: Not on file  . Years of education: Not on file  . Highest education level: Not on file  Social Needs  . Financial resource strain: Not on file  . Food insecurity - worry: Not on file  . Food insecurity - inability: Not on file  . Transportation needs - medical: Not on file  . Transportation needs - non-medical: Not on file  Occupational History  . Not on file  Tobacco Use  . Smoking status: Never Smoker  . Smokeless tobacco: Never Used  Substance and Sexual Activity  . Alcohol use: No  . Drug use: No  . Sexual  activity: Not on file  Other Topics Concern  . Not on file  Social History Narrative  . Not on file   History reviewed. No pertinent family history.  VITAL SIGNS BP (!) 148/88   Pulse (!) 121   Temp 97.9 F (36.6 C)   Resp 16   Ht 6\' 5"  (1.956 m)   Wt 223 lb 9.6 oz (101.4 kg)   SpO2 98%   BMI 26.52 kg/m     Medication List        Accurate as of 02/15/17  9:57 AM. Always use your most recent med list.          lisinopril 10 MG tablet Commonly known as:  PRINIVIL,ZESTRIL   OLANZapine zydis 10 MG disintegrating tablet Commonly known as:  ZYPREXA        SIGNIFICANT DIAGNOSTIC EXAMS  PREVIOUS  06-27-16: left foot x-ray: Comminuted calcaneal fracture involving the subtalar joint with soft tissue swelling.   06-27-16: right foot x-ray: Comminuted fracture of the calcaneus not well visualized on this exam. Recommend follow-up CT for further characterization of the fracture  06-27-16: right tibia/fibula x-ray: 1.  No acute osseous injury of the right tibia and fibula. 2. Severely comminuted fracture of the anterior, mid and posterior calcaneus.   06-27-16: left tibia/fibula x-ray: 1. Complex fracture of the calcaneus.  Recommend CT of the  hindfoot. 2. No tibial fracture.  06-27-16: ct of bilateral feet: 1. Acute, closed, comminuted Sanders type 3 AC fracture the calcaneus with intra-articular involvement of the subtalar and calcaneocuboid articulations. Approximately 5 mm depression is noted of the central calcaneal fracture fragment. 2. Acute, closed, comminuted Sanders type 3 AB fracture of the calcaneus with intra-articular extension into the subtalar and calcaneocuboid articulations and also demonstrating 5 mm of depression along the medial aspect of the main central calcaneal fracture fragment.  NO NEW EXAMS  LABS REVIEWED:  PREVIOUS  06-27-16: wbc 18.2; hgb 16.1; hct 45.3; mcv 83.1; plt 271; glucose 77; bun 12; creat 1.22; k+ 3.6; na++ 139; ca 9.7; HIV: nr 06-29-16:  total bili 1.6; indirect bili: 1.5; albumin 4.0; chol 159; ldl 90; trig 179; hdl 33 07-02-16: glucose 119; bun 13; creat 1.01; k+ 3.7; na++ 138; ca 8.6  08-31-16: glucose 103; bun 14.9; creat 1.00; k+ 4.1; na++ 141; ca 9.0   NO NEW LABS    Review of Systems  Constitutional: Negative for malaise/fatigue.  Respiratory: Negative for cough and shortness of breath.   Cardiovascular: Negative for chest pain, palpitations and leg swelling.  Gastrointestinal: Negative for abdominal pain, constipation and heartburn.  Musculoskeletal: Negative for back pain, joint pain and myalgias.  Skin: Negative.   Neurological: Negative for dizziness.  Psychiatric/Behavioral: The patient is not nervous/anxious.    Physical Exam  Constitutional: He is oriented to person, place, and time. He appears well-developed and well-nourished. No distress.  Neck: No thyromegaly present.  Cardiovascular: Normal rate, regular rhythm, normal heart sounds and intact distal pulses.  Pulmonary/Chest: Effort normal and breath sounds normal. No respiratory distress.  Abdominal: Soft. Bowel sounds are normal. He exhibits no distension. There is no tenderness.  Musculoskeletal: He exhibits no edema.  Lymphadenopathy:    He has no cervical adenopathy.  Neurological: He is alert and oriented to person, place, and time.  Skin: Skin is warm and dry. He is not diaphoretic.  Has tattoos   Psychiatric: He has a normal mood and affect.     ASSESSMENT/ PLAN:  Patient is being discharged with the following home health services:  None   Patient is being discharged with the following durable medical equipment:  None required  Patient has been advised to f/u with their PCP in 1-2 weeks to bring them up to date on their rehab stay.  Social services at facility was responsible for arranging this appointment.  Pt was provided with a 30 day supply of prescriptions for medications and refills must be obtained from their PCP.  For controlled  substances, a more limited supply may be provided adequate until PCP appointment only.  A 30 day supply of his medications have been written per the medication list as above.  He does not have any narcotics   Time spent with patient: 40 minutes: discussed discharge process; his medications; and the follow he will need upon discharge and he has verbalized understanding.    Synthia Innocenteborah Ashara Lounsbury NP [facility]  Contact [phone] Monday through Friday 8am- 5pm  After hours call [phone]

## 2017-02-16 ENCOUNTER — Encounter: Payer: Self-pay | Admitting: Internal Medicine

## 2017-09-01 ENCOUNTER — Ambulatory Visit: Payer: Medicaid Other | Admitting: Podiatry

## 2017-09-15 ENCOUNTER — Encounter: Payer: Self-pay | Admitting: Podiatry

## 2017-09-15 ENCOUNTER — Ambulatory Visit: Payer: Medicaid Other | Admitting: Podiatry

## 2017-09-15 DIAGNOSIS — M79674 Pain in right toe(s): Secondary | ICD-10-CM | POA: Diagnosis not present

## 2017-09-15 DIAGNOSIS — M79675 Pain in left toe(s): Secondary | ICD-10-CM | POA: Diagnosis not present

## 2017-09-15 DIAGNOSIS — B351 Tinea unguium: Secondary | ICD-10-CM

## 2017-09-15 DIAGNOSIS — B353 Tinea pedis: Secondary | ICD-10-CM

## 2017-09-15 MED ORDER — CICLOPIROX OLAMINE 0.77 % EX CREA: TOPICAL_CREAM | Freq: Two times a day (BID) | CUTANEOUS | 0 refills | Status: DC

## 2017-09-17 NOTE — Progress Notes (Signed)
Subjective: Bob Reyes presents today with cc of painful, discolored, thick toenails which interfere with daily activities and routine tasks.  Pain is aggravated when wearing enclosed shoe gear. Pain is getting progressively worse and relieved with periodic professional debridement.  Objective: Vascular Examination: Capillary refill time WNL b/l Dorsalis pedis and posterior tibial pulses present b/l No digital hair x 10 digits Skin temperature warm to warm b/l  Dermatological Examination:  Well healed surgical scars lateral aspect of both heels   Toenails 1-5 b/l discolored, thick, dystrophic with subungual debris and pain with palpation to nailbeds due to thickness of nails.  Diffuse scaling noted plantarly, peripherally b/l with mild foot odor. Web spaces are clear b/l  Musculoskeletal: Muscle strength 5/5 to all LE muscle groups  Neurological: Sensation intact with 10 gram monofilament. Vibratory sensation intact.  Assessment: Painful onychomycosis toenails 1-5 b/l  Tinea pedis b/l   Plan: 1. Toenails 1-5 b/l were debrided in length and girth without iatrogenic bleeding. 2. Start Loprox Cream 0.77%: apply to both feet bid x 4 weeks 3. Patient to continue soft, supportive shoe gear 4. Patient to report any pedal injuries to medical professional immediately. 5. Follow up 3 months.  6. Patient/POA to call should there be a concern in the interim.

## 2017-12-15 ENCOUNTER — Ambulatory Visit: Payer: Medicaid Other | Admitting: Podiatry

## 2018-02-05 ENCOUNTER — Ambulatory Visit: Payer: Medicaid Other | Admitting: Podiatry

## 2018-02-20 ENCOUNTER — Ambulatory Visit: Payer: Medicaid Other | Admitting: Podiatry

## 2018-03-08 ENCOUNTER — Telehealth (INDEPENDENT_AMBULATORY_CARE_PROVIDER_SITE_OTHER): Payer: Self-pay | Admitting: Orthopaedic Surgery

## 2018-03-08 NOTE — Telephone Encounter (Signed)
Long discussion with patient's mother.  She states sometimes she also carries groceries up stairs.  She states he has used his cane some with ambulation.  She is wanting to get him on the first floor instead of third floor.  I discussed with her I have not seen him since 2018 and when I did he was walking up and down stairs and was ambulating without a limp.  She states now sometimes he is using his cane.  She states he is not able to jump.

## 2018-03-08 NOTE — Telephone Encounter (Signed)
Patient's mother Bob Reyes) called advised patient need a letter from Dr Ophelia Charter stating patient need to be moved from the third floor of his apartment building to the first floor due to his medical condition. Patient had surgery 05/28/2016. Tia said patient is having a hard time with everyday living being on the third floor. Patient is also tripping over things as well. The number to contact Tia is [phone]

## 2018-03-08 NOTE — Telephone Encounter (Signed)
Please advise 

## 2018-05-02 ENCOUNTER — Ambulatory Visit: Payer: Medicaid Other | Admitting: Podiatry

## 2018-05-02 ENCOUNTER — Other Ambulatory Visit: Payer: Self-pay

## 2018-05-02 DIAGNOSIS — M79674 Pain in right toe(s): Secondary | ICD-10-CM

## 2018-05-02 DIAGNOSIS — M79675 Pain in left toe(s): Secondary | ICD-10-CM | POA: Diagnosis not present

## 2018-05-02 DIAGNOSIS — B353 Tinea pedis: Secondary | ICD-10-CM

## 2018-05-02 DIAGNOSIS — B351 Tinea unguium: Secondary | ICD-10-CM

## 2018-05-02 MED ORDER — CICLOPIROX OLAMINE 0.77 % EX CREA: TOPICAL_CREAM | Freq: Two times a day (BID) | CUTANEOUS | 0 refills | Status: DC

## 2018-05-02 NOTE — Patient Instructions (Signed)

## 2018-05-10 ENCOUNTER — Encounter: Payer: Self-pay | Admitting: Podiatry

## 2018-05-10 NOTE — Progress Notes (Signed)
Subjective: Bob Reyes presents today with painful, thick toenails 1-5 b/l that he cannot cut and which interfere with daily activities.  Pain is aggravated when wearing enclosed shoe gear and relieved with periodic professional debridement.  Rometta Emery, MD is his PCP.   No Known Allergies  Objective:  Vascular Examination: Capillary refill time immediate x 10 digits.  Dorsalis pedis and Posterior tibial pulses palpable b/l.  Digital hair present x 10 digits.  Skin temperature gradient WNL b/l.  Dermatological Examination: Skin with normal turgor, texture and tone b/l.  Well healed surgical scars lateral aspect of both heels.  Toenails 1-5 b/l discolored, thick, dystrophic with subungual debris and pain with palpation to nailbeds due to thickness of nails.  Diffuse scaling noted peripherally and plantarly b/l feet with mild foot odor.  No interdigital macerations.  No blisters, no weeping. No signs of secondary bacterial infection noted.  Web spaces clear b/l.  Dry, flaky skin b/l heels.  Musculoskeletal: Muscle strength 5/5 to all LE muscle groups  No gross bony deformities b/l.  No pain, crepitus or joint limitation noted with ROM.   Neurological: Sensation intact with 10 gram monofilament.  Vibratory sensation intact.  Assessment: Painful onychomycosis toenails 1-5 b/l  Tinea pedis  Plan: 1. Toenails 1-5 b/l were debrided in length and girth without iatrogenic bleeding. 2. Refill Ciclopirox Cream 0.77 %. Discussed pedal hygiene and gave advice how to eradicate chronic tinea pedis requires concomitant shoe gear management, changing socks, avoiding moist, wet environments and cleaning shower/tub with bleach based cleanser. 3. Advised patient to use pumice stone on heels once weekly after shower/bath. Apply moisturizer afterwards. 4. Patient to continue soft, supportive shoe gear daily. 5. Patient to report any pedal injuries to medical professional  immediately. 6. Follow up 3 months.  7. Patient/POA to call should there be a concern in the interim.

## 2018-08-01 ENCOUNTER — Ambulatory Visit: Payer: Medicaid Other | Admitting: Podiatry

## 2018-12-21 IMAGING — CT CT FOOT*L* W/O CM
3 series · 10 of 35 positions shown, 12 images · non-contrast
Comparison: Same day radiographs of the feet

CLINICAL DATA: Bilateral foot pain after jumping out from balcony.

EXAM:
CT OF THE LEFT FOOT WITHOUT CONTRAST
CT OF THE RIGHT FOOT WITHOUT CONTRAST
TECHNIQUE: Multidetector CT imaging of the both feet were performed according
to the standard protocol. Multiplanar CT image reconstructions were
also generated.

[Series 12: lt lower ext cor (id) bone · coronal · left · 0.31mm/px · 3 of 217 slices shown]
[im 89/217  bone]
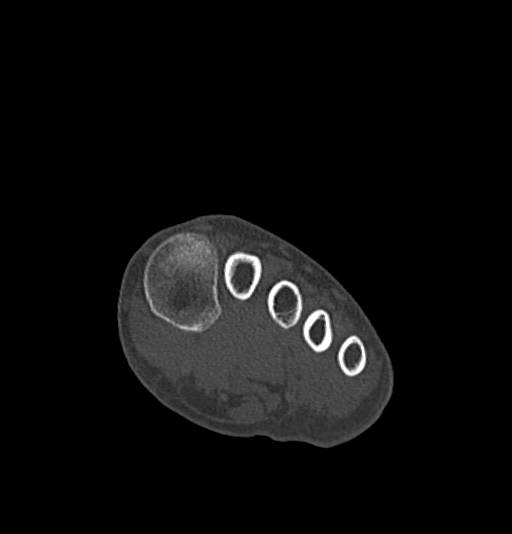
[im 102/217  bone]
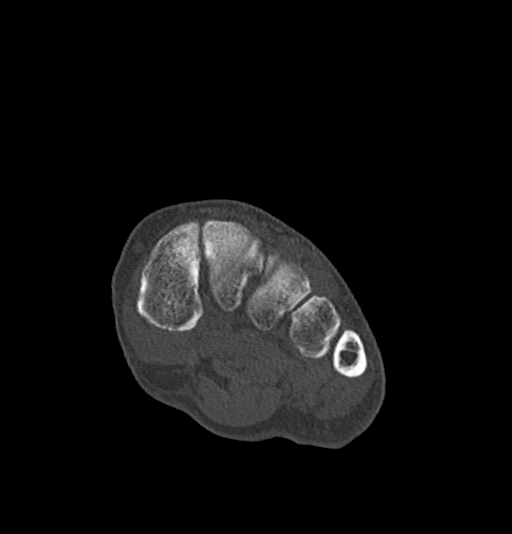
[im 115/217  bone]
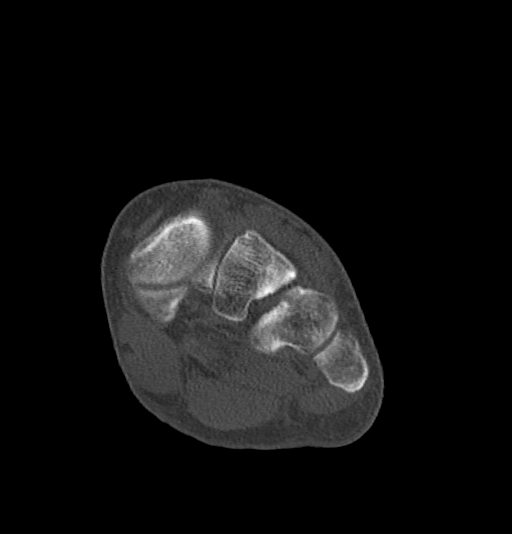

[Series 13: lt lower ext sag (id) bone · sagittal · left · 0.38mm/px · 5 of 101 slices shown, 6 images]
[im 34/101  bone]
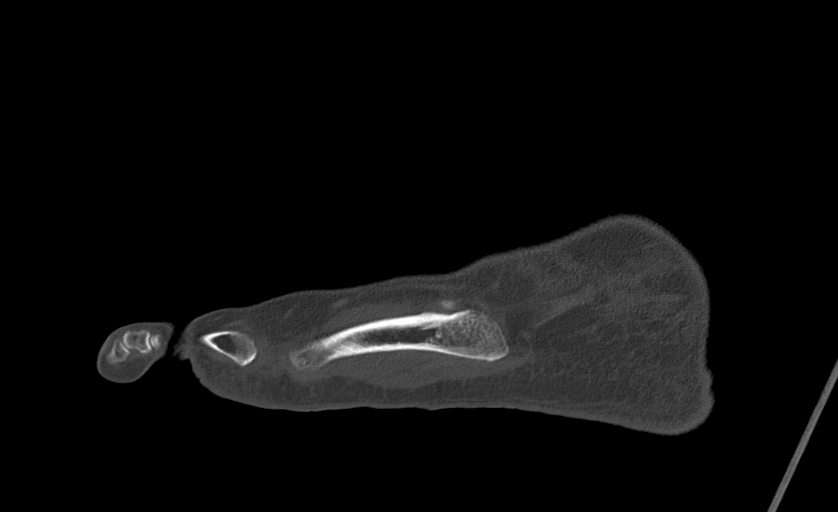
[im 42/101  bone]
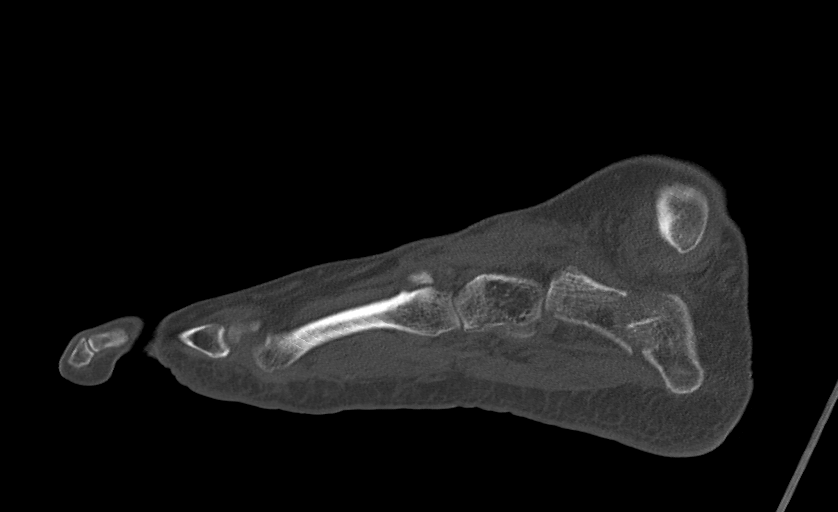
[im 51/101  soft-tissue]
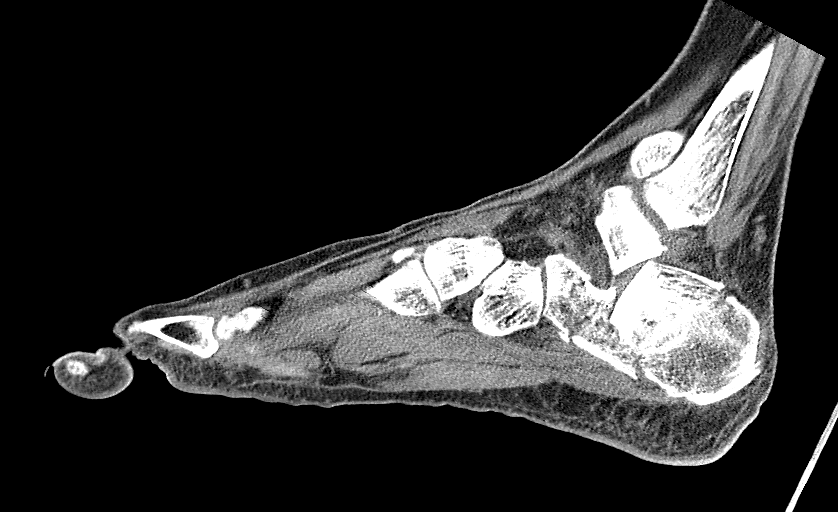
[im 51/101  bone]
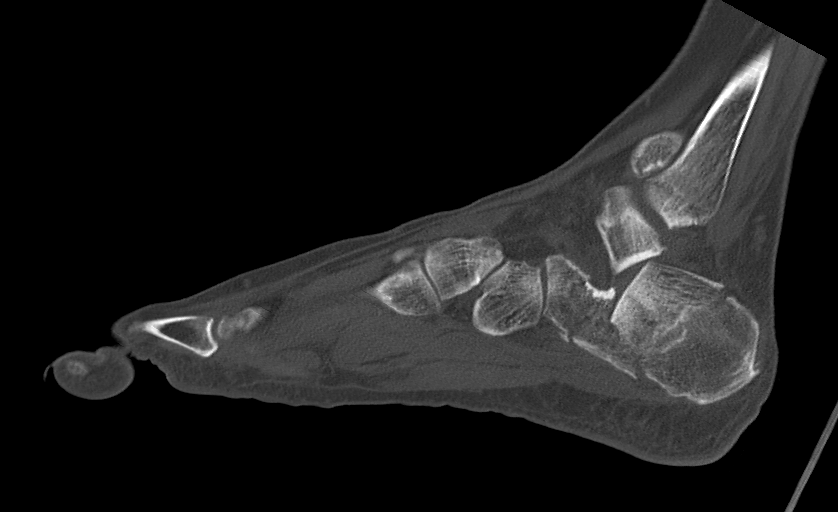
[im 59/101  bone]
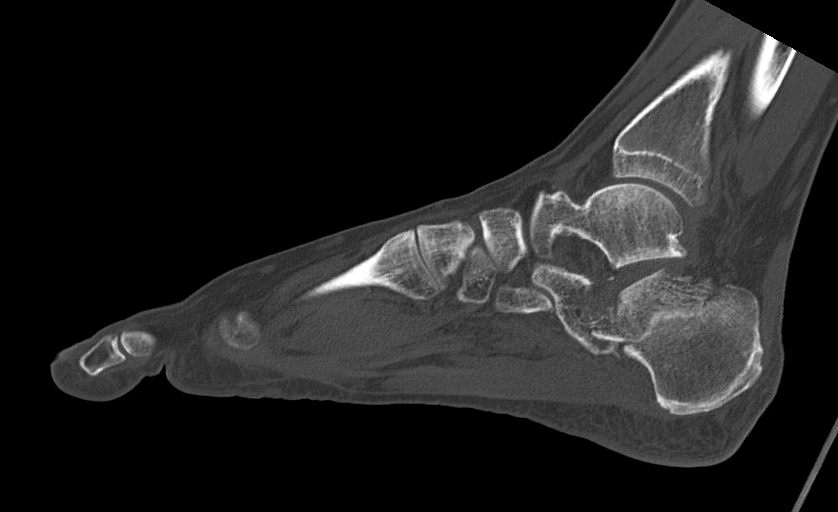
[im 67/101  bone]
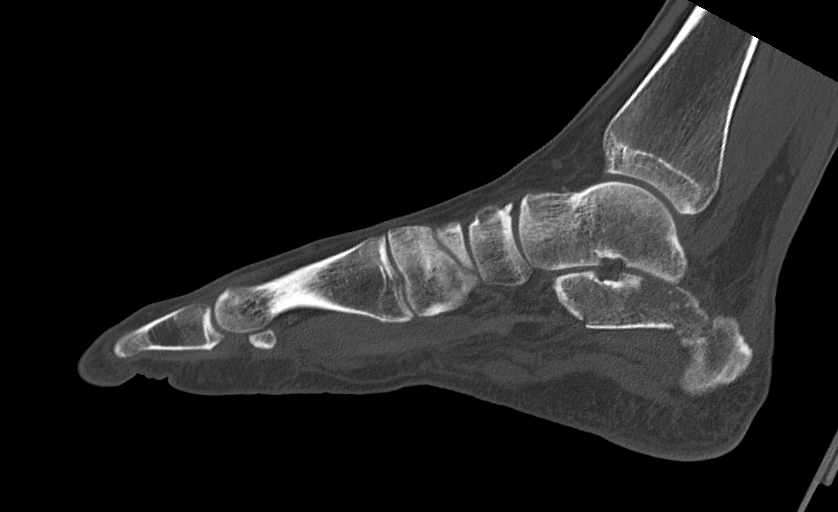

[Series 14: lt lower ext 1.5 ax st · axial · left · 0.29mm/px · z∈[-102,-30]mm · 2 of 124 slices shown, 3 images]
[im 38/124  soft-tissue]
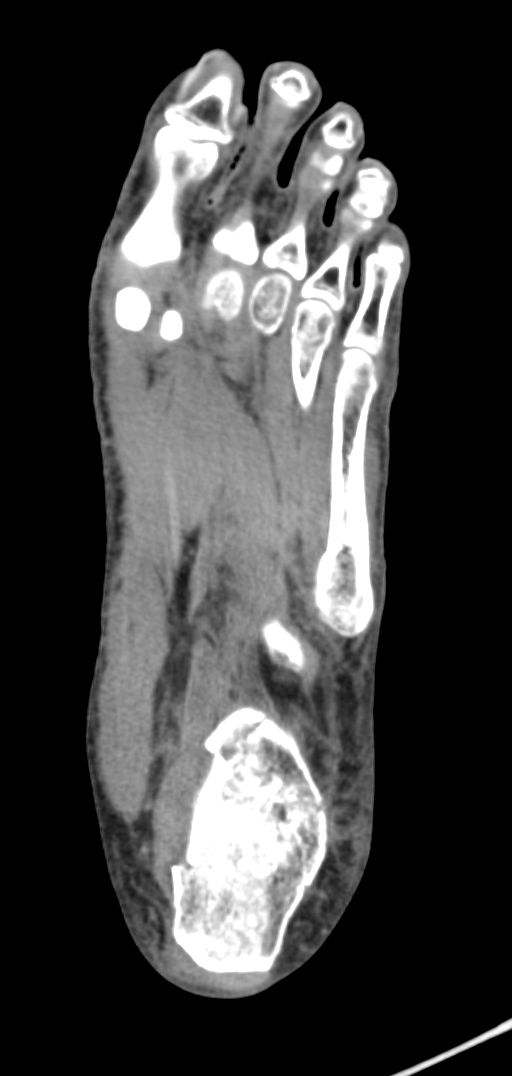
[im 38/124  bone]
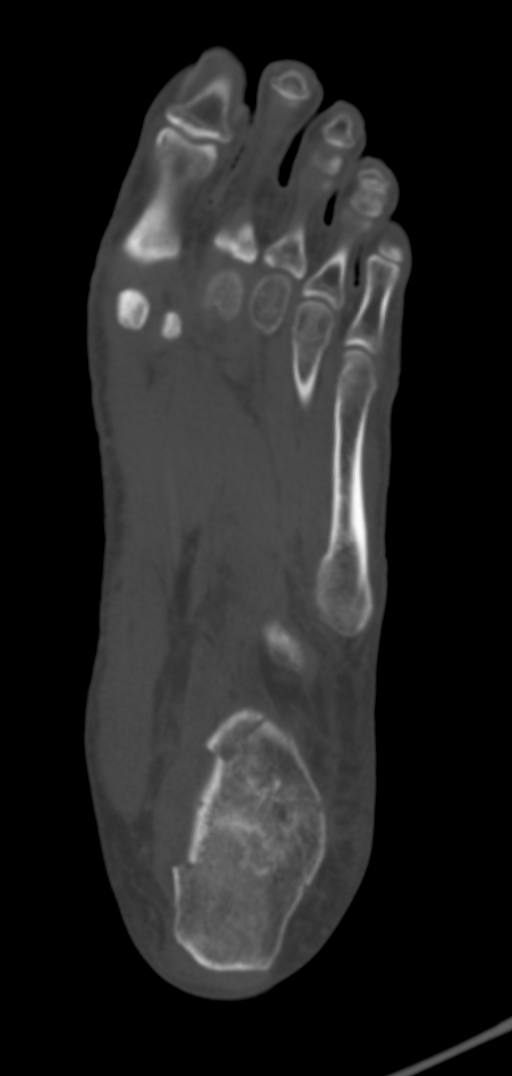
[im 95/124  bone]
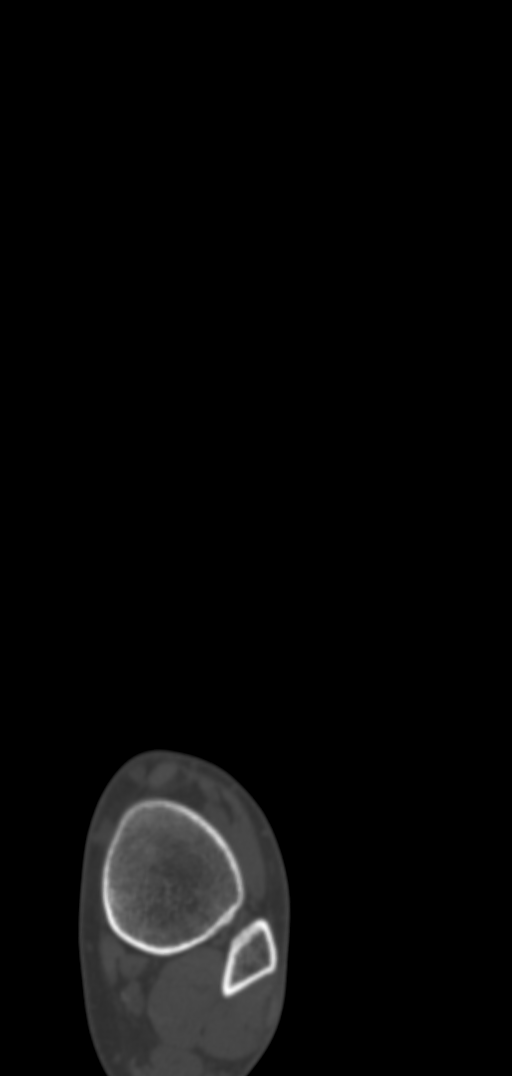

[10 of 35 positions shown; findings below may reference images not displayed]

FINDINGS: RIGHT FOOT:

An acute, comminuted Ghillos type 3 AC fracture of the calcaneus
with intra-articular involvement of the posterior articular facet of
the subtalar joint is noted with depressed middle component up to 5
mm. Fracture lucencies also extend into the calcaneocuboid
articulation as well as the sinus tarsi without encroachment.

Ligaments

Suboptimally assessed by CT.

Muscles and Tendons

No intramuscular hemorrhage. The peroneal tendons are intact without
encroachment or entrapment. Medial tendons crossing the ankle joint
are without definite entrapment. Extensor tendons crossing the ankle
joint are unremarkable.

Soft tissues

Expected soft tissue swelling about the ankle and hindfoot secondary
to the comminuted calcaneal fracture. No abnormal fluid collections.

LEFT FOOT:

An acute, comminuted Ghillos type 3 AB fracture of the calcaneus is
noted with intra-articular extension into the subtalar and
calcaneocuboid articulations. Slight varus deformity of the
calcaneus secondary to the fracture, series 12, image 162. The main
fracture fragment is slightly depressed on the coronal images along
its medial aspect by 5 mm as well. Remainder of the visualized mid
foot, metatarsals and toes are intact. The distal tibia and fibula
are unremarkable. The ankle mortise is congruent.

Ligaments

Suboptimally assessed by CT.

Muscle and tendons

The medial and lateral tendons crossing the ankle joint do not
appear entrapped by the fracture fragments.

Soft tissues:

Expected soft tissue swelling about the ankle and hindfoot secondary
to the comminuted calcaneal fractures.
IMPRESSION: 1. Acute, closed, comminuted Ghillos type 3 AC fracture the
calcaneus with intra-articular involvement of the subtalar and
calcaneocuboid articulations. Approximately 5 mm depression is noted
of the central calcaneal fracture fragment.
2. Acute, closed, comminuted Ghillos type 3 AB fracture of the
calcaneus with intra-articular extension into the subtalar and
calcaneocuboid articulations and also demonstrating 5 mm of
depression along the medial aspect of the main central calcaneal
fracture fragment.

## 2018-12-21 IMAGING — CR DG FOOT COMPLETE 3+V*R*
3 series · 3 of 3 positions shown · non-contrast
Comparison: None.

CLINICAL DATA: Patient reports jumping from a 9ft balcony, reports
immediately after his left and right tib/fib were numb, reports now
his pain is distal to both of his tib/fibs.

EXAM:
RIGHT FOOT COMPLETE - 3+ VIEW

[foot ap]
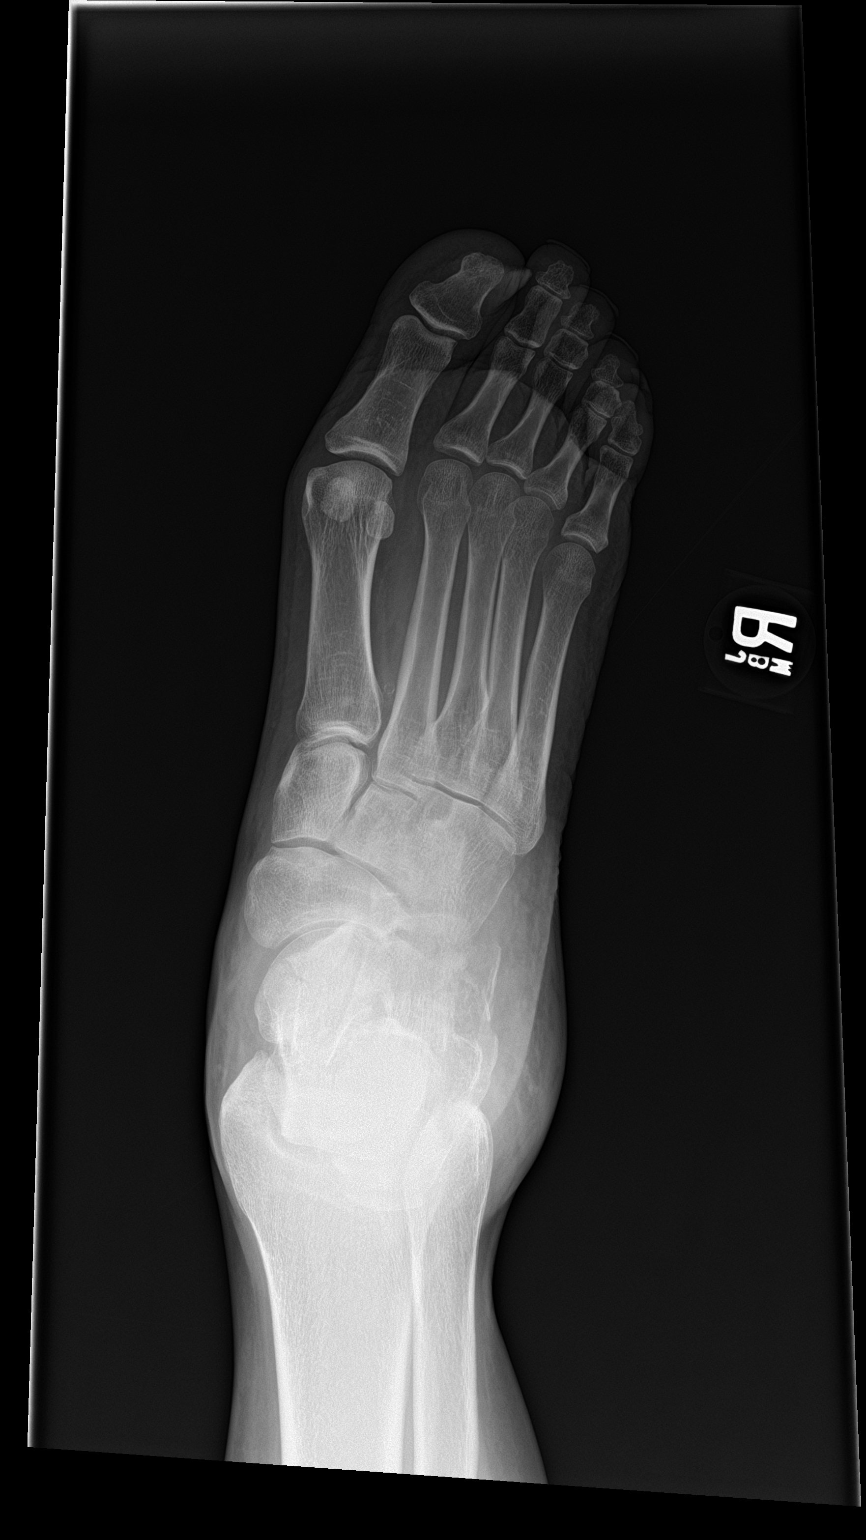

[foot obl]
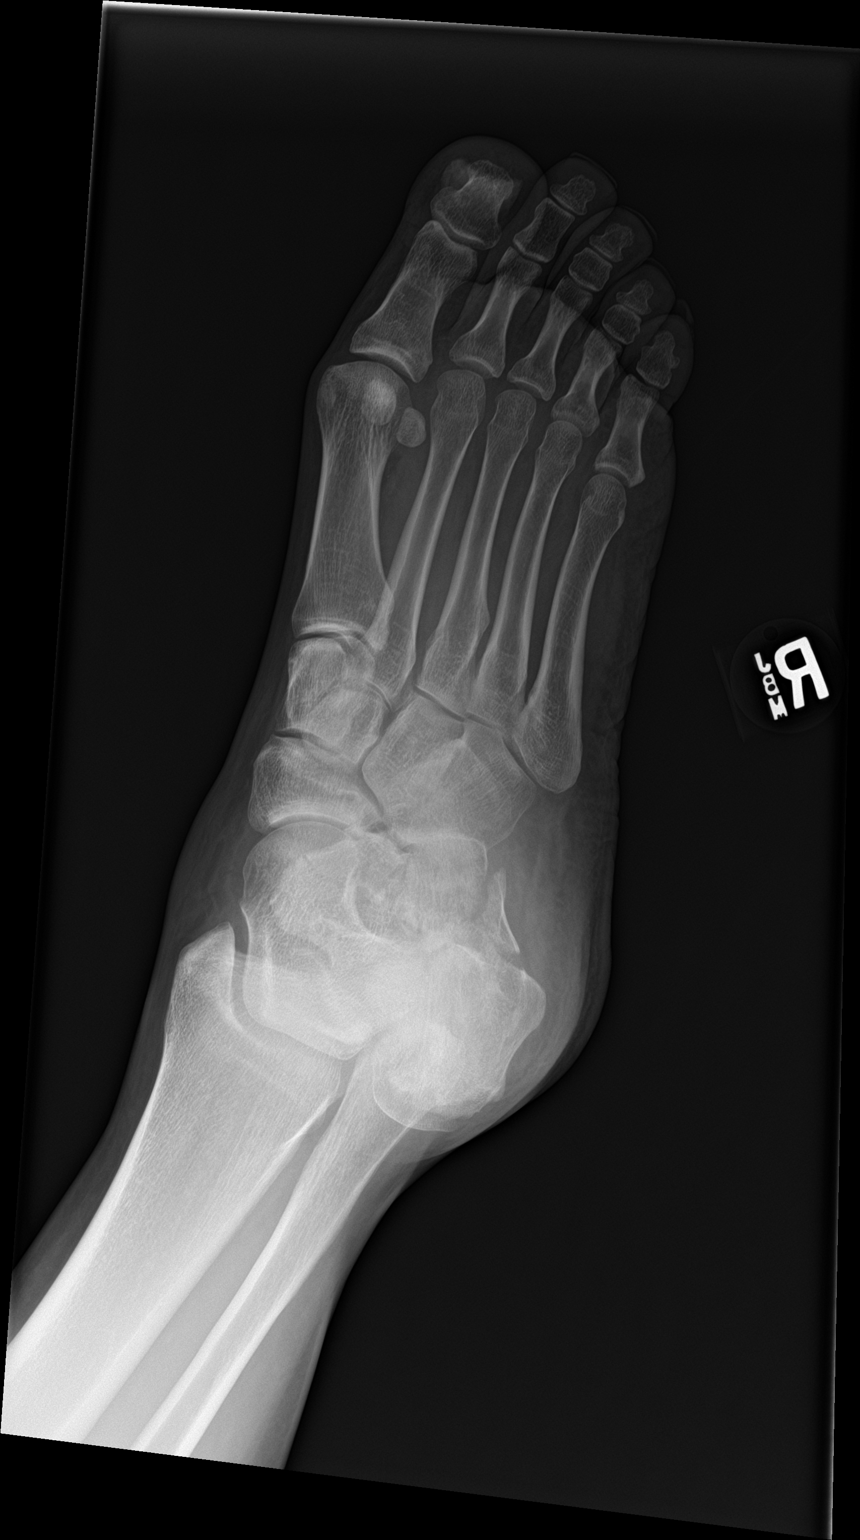

[foot lat]
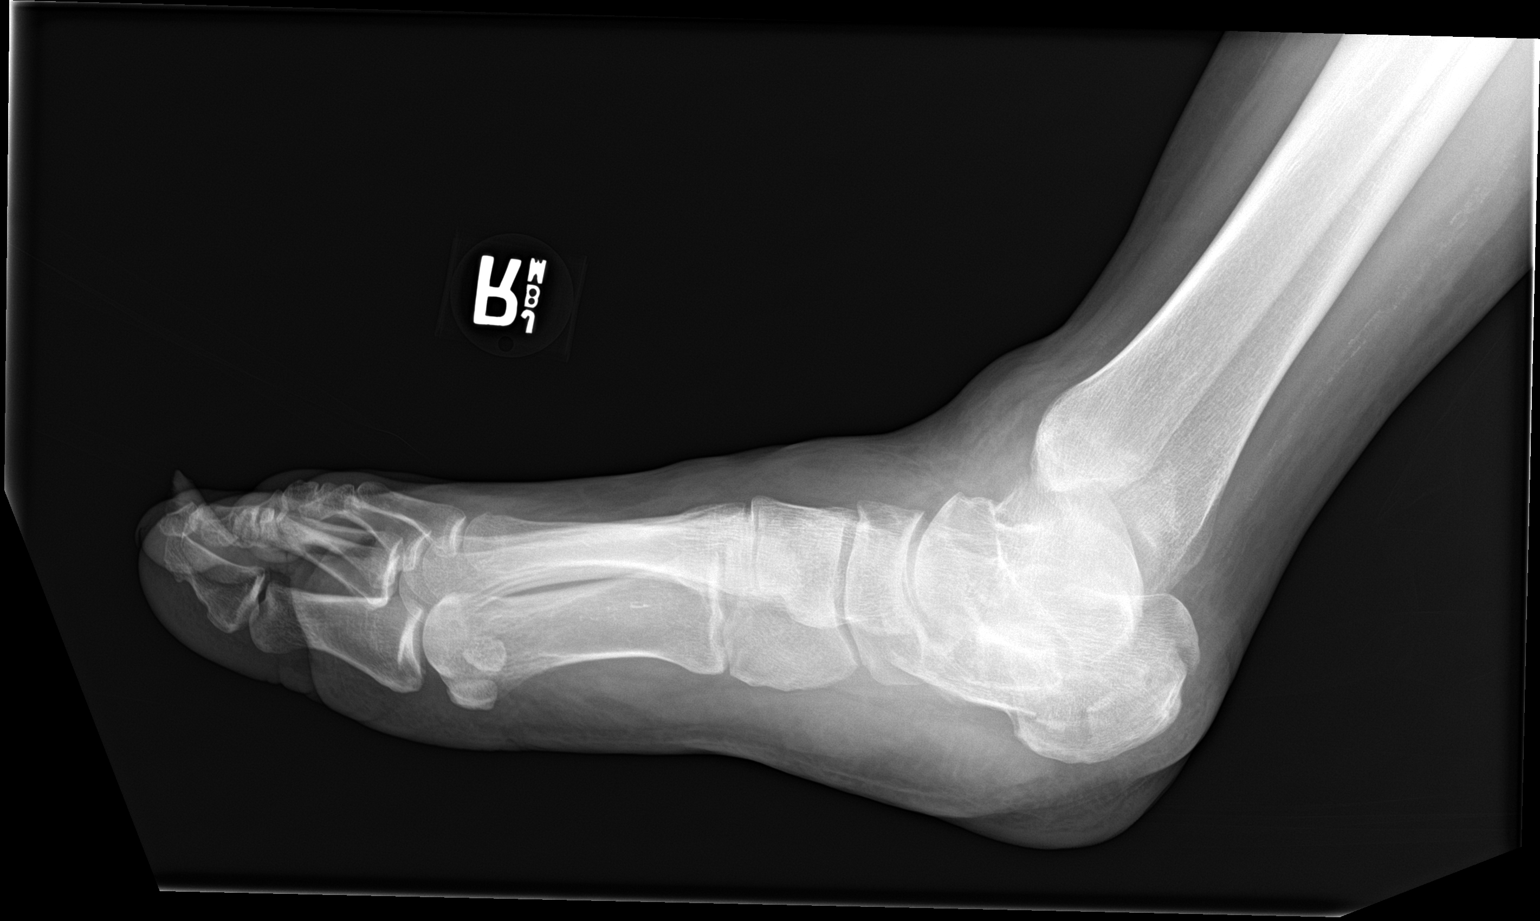

[3 of 3 positions shown; findings below may reference images not displayed]

FINDINGS: There is a comminuted fracture of the calcaneus, not well-defined
due to positioning on the lateral view. This appears have an
intra-articular component.

No other fractures. The calcaneal articulations are not well
visualized in this exam. Remaining foot articulations are normally
spaced and aligned.

There is diffuse mid and hindfoot soft tissue swelling.
IMPRESSION: Comminuted fracture of the calcaneus not well visualized on this
exam. Recommend follow-up CT for further characterization of the
fracture.

## 2019-03-01 ENCOUNTER — Ambulatory Visit (HOSPITAL_COMMUNITY)
Admission: RE | Admit: 2019-03-01 | Discharge: 2019-03-01 | Disposition: A | Discharge status: Home / Self Care | Payer: Medicaid Other | Attending: Psychiatry | Admitting: Psychiatry

## 2019-03-01 ENCOUNTER — Encounter (HOSPITAL_COMMUNITY): Payer: Self-pay | Admitting: Psychiatry

## 2019-03-01 DIAGNOSIS — R450 Nervousness: Secondary | ICD-10-CM | POA: Diagnosis not present

## 2019-03-01 DIAGNOSIS — Z91128 Patient's intentional underdosing of medication regimen for other reason: Secondary | ICD-10-CM | POA: Insufficient documentation

## 2019-03-01 DIAGNOSIS — F419 Anxiety disorder, unspecified: Secondary | ICD-10-CM | POA: Insufficient documentation

## 2019-03-01 DIAGNOSIS — R451 Restlessness and agitation: Secondary | ICD-10-CM | POA: Diagnosis not present

## 2019-03-01 DIAGNOSIS — F209 Schizophrenia, unspecified: Secondary | ICD-10-CM | POA: Insufficient documentation

## 2019-03-01 DIAGNOSIS — T50906A Underdosing of unspecified drugs, medicaments and biological substances, initial encounter: Secondary | ICD-10-CM | POA: Insufficient documentation

## 2019-03-01 NOTE — H&P (Addendum)
Behavioral Health Medical Screening Exam  Bob Reyes is an 39 y.o. male.  Presents to [facility] as a walk-in with mother and grandmother.  Family is citing concerns with paranoia and agitation.  Mother reports patient has not been medication compliant. " he has been letting people in the house."  States he is followed by psychiatrist Bob Reyes at [facility].  Reports patient's next appointment is February 28, 202. Bob Reyes is denying suicidal or homicidal,  mild thought blocking noted during this assessment and some disorganization.  He is awake, alert and oriented x3.  Reports  " I do take my medications, I take them together. "  I feel fine and I ready to leave."   Discussed reasons for inpatient admissions.  Mother states she is going to attempt to have patient be involuntarily committed.  Total Time spent with patient: 15 minutes  Psychiatric Specialty Exam: Physical Exam  Review of Systems  Psychiatric/Behavioral: Negative for hallucinations. The patient is nervous/anxious.   All other systems reviewed and are negative.   There were no vitals taken for this visit.There is no height or weight on file to calculate BMI.  General Appearance: Casual  Eye Contact:  Fair  Speech:  Clear and Coherent  Volume:  Normal  Mood:  Anxious and Irritable  Affect:  Congruent  Thought Process:  Disorganized  Orientation:  Full (Time, Place, and Person)  Thought Content:  Logical and Hallucinations: denied, however mild thought blocking noted  Suicidal Thoughts:  No  Homicidal Thoughts:  No  Memory:  Immediate;   Fair Recent;   Fair  Judgement:  Fair  Insight:  Fair  Psychomotor Activity:  NA  Concentration: Concentration: Fair  Recall:  [employer] of Knowledge:Fair  Language: Fair  Akathisia:  No  Handed:  Right  AIMS (if indicated):     Assets:  Communication Skills Desire for Improvement Resilience Social Support  Sleep:       Musculoskeletal: Strength & Muscle Tone: within  normal limits Gait & Station: normal Patient leans: N/A  There were no vitals taken for this visit.  Recommendations: Family was provided with additional outpatient resources -Discussed follow-up with primary psychiatrist for early appointment  Based on my evaluation the patient does not appear to have an emergency medical condition.  Oneta Rack, NP 03/01/2019, 4:00 PM

## 2019-03-01 NOTE — BH Assessment (Signed)
Assessment Note  Bob Reyes is an 39 y.o. male presenting voluntarily to [facility] for assessment. Patient is accompanied by his aunt, Tia, and his mother, who are present for assessment and provide collateral. Patient renders limited history. He states "I feel like my medicine may be too strong." He states he takes Zyprexa and goes to [facility] where he is treated for schizophrenia.  He denies SI/HI/AVH/paranoia. Patient does appear to be thought blocking and seems to be preoccupied with drinking enough water. Patient states multiple times "I just need to go drink some water." Patient denies any substance use or trauma. Patient lives with his aunt.  Per aunt and mother: Patient displays bizarre behavior including sitting outside in the rain and allowing untrustworthy people into the home. They report patient is paranoid. He has had 5 prior psychiatric hospitalizations, most recently in 2016. They are concerned patient is being taken advantage of and would like to have his medications be adjusted.  Patient is alert and oriented x 4. He is appropriately dressed. His speech is soft, eye contact is poor, and thought process is difficult to assess. His mood is pleasant and his affect is flat. His insight, judgement, and impulse control are fair. He appears to be responding to internal stimuli.  Diagnosis: Schizophrenia  Past Medical History:  Past Medical History:  Diagnosis Date  . Closed fracture of bone of left foot   . Closed fracture of bone of right foot   . Constipation due to opioid therapy 07/05/2016  . Paranoid schizophrenia, chronic condition ([place]) 06/28/2016    Past Surgical History:  Procedure Laterality Date  . ORIF CALCANEOUS FRACTURE Left 06/29/2016   Procedure: OPEN REDUCTION INTERNAL FIXATION (ORIF) CALCANEOUS FRACTURE;  Surgeon: Marybelle Killings, MD;  Location: [facility];  Service: Orthopedics;  Laterality: Left;  . ORIF CALCANEOUS FRACTURE Right 07/01/2016   Procedure: OPEN REDUCTION INTERNAL  FIXATION (ORIF) RIGHT CALCANEOUS FRACTURE;  Surgeon: Marybelle Killings, MD;  Location: [facility];  Service: Orthopedics;  Laterality: Right;    Family History: History reviewed. No pertinent family history.  Social History:  reports that he has never smoked. He has never used smokeless tobacco. He reports that he does not drink alcohol or use drugs.  Additional Social History:  Alcohol / Drug Use Pain Medications: see MAR Prescriptions: see MAR Over the Counter: see MAR History of alcohol / drug use?: No history of alcohol / drug abuse  CIWA: CIWA-Ar BP: 140/88 Pulse Rate: 97 COWS:    Allergies: No Known Allergies  Home Medications: (Not in a hospital admission)   OB/GYN Status:  No LMP for male patient.  General Assessment Data Location of Assessment: [facility] Assessment Services TTS Assessment: In system Is this a Tele or Face-to-Face Assessment?: Face-to-Face Is this an Initial Assessment or a Re-assessment for this encounter?: Initial Assessment Patient Accompanied by:: Parent, Adult Permission Given to speak with another: Yes Name, Relationship and Phone Number: mother and aunt Language Other than English: No Living Arrangements: (with aunt) What gender do you identify as?: Male Marital status: Single Maiden name: Miotke Pregnancy Status: No Living Arrangements: Other relatives Can pt return to current living arrangement?: Yes Admission Status: Voluntary Is patient capable of signing voluntary admission?: Yes Referral Source: Self/Family/Friend Insurance type: Medicaid  Medical Screening Exam ([place]) Medical Exam completed: Yes  Crisis Care Plan Living Arrangements: Other relatives Legal Guardian: (self) Name of Psychiatrist: Dr. Margarito Liner Name of Therapist: none  Education Status Is patient currently in school?: No  Is the patient employed, unemployed or receiving disability?: Unemployed  Risk to self with the past 6 months Suicidal Ideation: No Has patient  been a risk to self within the past 6 months prior to admission? : No Suicidal Intent: No Has patient had any suicidal intent within the past 6 months prior to admission? : No Is patient at risk for suicide?: No Suicidal Plan?: No Has patient had any suicidal plan within the past 6 months prior to admission? : No Access to Means: No What has been your use of drugs/alcohol within the last 12 months?: denies Previous Attempts/Gestures: No How many times?: 0 Other Self Harm Risks: none Triggers for Past Attempts: None known Intentional Self Injurious Behavior: None Family Suicide History: No Recent stressful life event(s): (none noted) Persecutory voices/beliefs?: No Depression: No Depression Symptoms: (none) Substance abuse history and/or treatment for substance abuse?: No Suicide prevention information given to non-admitted patients: Not applicable  Risk to Others within the past 6 months Homicidal Ideation: No Does patient have any lifetime risk of violence toward others beyond the six months prior to admission? : No Thoughts of Harm to Others: No Current Homicidal Intent: No Current Homicidal Plan: No Access to Homicidal Means: No Identified Victim: none History of harm to others?: No Assessment of Violence: None Noted Violent Behavior Description: none Does patient have access to weapons?: No Criminal Charges Pending?: No Does patient have a court date: No Is patient on probation?: No  Psychosis Hallucinations: Auditory Delusions: Persecutory  Mental Status Report Appearance/Hygiene: Unremarkable Eye Contact: Poor Motor Activity: Freedom of movement Speech: Logical/coherent Level of Consciousness: Alert Mood: Pleasant Affect: Flat Anxiety Level: None Thought Processes: Unable to Assess Judgement: Impaired Orientation: Person, Place, Time, Situation Obsessive Compulsive Thoughts/Behaviors: None  Cognitive Functioning Concentration: Poor Memory: Recent Intact,  Remote Intact Is patient IDD: No Insight: Poor Impulse Control: Poor Appetite: Good Have you had any weight changes? : No Change Sleep: No Change Total Hours of Sleep: 8 Vegetative Symptoms: None  ADLScreening [facility] Assessment Services) Patient's cognitive ability adequate to safely complete daily activities?: Yes Patient able to express need for assistance with ADLs?: Yes Independently performs ADLs?: Yes (appropriate for developmental age)  Prior Inpatient Therapy Prior Inpatient Therapy: Yes Prior Therapy Dates: 2018 Prior Therapy Facilty/Provider(s): [facility] Reason for Treatment: psychosis  Prior Outpatient Therapy Prior Outpatient Therapy: Yes Prior Therapy Dates: ongoing Prior Therapy Facilty/Provider(s): [facility] Reason for Treatment: med management Does patient have an ACCT team?: No Does patient have Intensive In-House Services?  : No Does patient have [facility] services? : Yes Does patient have P[facility] services?: No  ADL Screening (condition at time of admission) Patient's cognitive ability adequate to safely complete daily activities?: Yes Is the patient deaf or have difficulty hearing?: No Does the patient have difficulty seeing, even when wearing glasses/contacts?: No Does the patient have difficulty concentrating, remembering, or making decisions?: No Patient able to express need for assistance with ADLs?: Yes Does the patient have difficulty dressing or bathing?: No Independently performs ADLs?: Yes (appropriate for developmental age) Does the patient have difficulty walking or climbing stairs?: No Weakness of Legs: None Weakness of Arms/Hands: None  Home Assistive Devices/Equipment Home Assistive Devices/Equipment: None  Therapy Consults (therapy consults require a physician order) PT Evaluation Needed: No OT Evalulation Needed: No SLP Evaluation Needed: No Abuse/Neglect Assessment (Assessment to be complete while patient is alone) Abuse/Neglect Assessment  Can Be Completed: Yes Physical Abuse: Denies Verbal Abuse: Denies Sexual Abuse: Denies Exploitation of patient/patient's resources: Denies  Self-Neglect: Denies Values / Beliefs Cultural Requests During Hospitalization: None Spiritual Requests During Hospitalization: None Consults Spiritual Care Consult Needed: No Transition of Care Team Consult Needed: No Advance Directives (For Healthcare) Does Patient Have a Medical Advance Directive?: No Would patient like information on creating a medical advance directive?: No - Patient declined          Disposition: Per Hillery Jacks, NP patient does not meet in patient criteria. Patient to follow up with his outpatient provider. Disposition Initial Assessment Completed for this Encounter: Yes Disposition of Patient: Discharge Patient refused recommended treatment: No  On Site Evaluation by:   Reviewed with Physician:    Celedonio Miyamoto 03/01/2019 5:04 PM

## 2019-04-17 ENCOUNTER — Ambulatory Visit: Payer: Medicaid Other | Admitting: Podiatry

## 2019-09-23 ENCOUNTER — Ambulatory Visit: Payer: Medicaid Other | Admitting: Podiatry

## 2019-09-23 ENCOUNTER — Other Ambulatory Visit: Payer: Self-pay

## 2019-09-23 ENCOUNTER — Encounter: Payer: Self-pay | Admitting: Podiatry

## 2019-09-23 DIAGNOSIS — L84 Corns and callosities: Secondary | ICD-10-CM

## 2019-09-23 DIAGNOSIS — M79674 Pain in right toe(s): Secondary | ICD-10-CM

## 2019-09-23 DIAGNOSIS — M79675 Pain in left toe(s): Secondary | ICD-10-CM

## 2019-09-23 DIAGNOSIS — B351 Tinea unguium: Secondary | ICD-10-CM | POA: Diagnosis not present

## 2019-09-23 DIAGNOSIS — M79672 Pain in left foot: Secondary | ICD-10-CM

## 2019-09-23 DIAGNOSIS — M79671 Pain in right foot: Secondary | ICD-10-CM

## 2019-09-23 NOTE — Patient Instructions (Signed)

## 2019-09-24 ENCOUNTER — Ambulatory Visit: Payer: Medicaid Other | Admitting: Podiatry

## 2019-09-24 NOTE — Progress Notes (Signed)
Subjective:  Patient ID: Bob Reyes, male    DOB: 07/21/1980,  MRN: [id number]  Bob Reyes presents to clinic today for painful callus(es) b/l feet and painful thick toenails that are difficult to trim. Painful toenails interfere with ambulation. Aggravating factors include wearing enclosed shoe gear. Pain is relieved with periodic professional debridement. Painful calluses are aggravated when weightbearing with and without shoegear. Pain is relieved with periodic professional debridement.  He voices no new pedal problems on today's visit.   Review of Systems: Negative except as noted in the HPI. Past Medical History:  Diagnosis Date  . Closed fracture of bone of left foot   . Closed fracture of bone of right foot   . Constipation due to opioid therapy 07/05/2016  . Paranoid schizophrenia, chronic condition ([facility]) 06/28/2016   Past Surgical History:  Procedure Laterality Date  . ORIF CALCANEOUS FRACTURE Left 06/29/2016   Procedure: OPEN REDUCTION INTERNAL FIXATION (ORIF) CALCANEOUS FRACTURE;  Surgeon: Eldred Manges, MD;  Location[facility];  Service: Orthopedics;  Laterality: Left;  . ORIF CALCANEOUS FRACTURE Right 07/01/2016   Procedure: OPEN REDUCTION INTERNAL FIXATION (ORIF) RIGHT CALCANEOUS FRACTURE;  Surgeon: Eldred Manges, MD;  Location: [facility];  Service: Orthopedics;  Laterality: Right;    Current Outpatient Medications:  .  ciclopirox (LOPROX) 0.77 % cream, Apply topically 2 (two) times daily., Disp: 15 g, Rfl: 0 .  lisinopril (PRINIVIL,ZESTRIL) 10 MG tablet, Take 10 mg by mouth daily., Disp: , Rfl:  .  OLANZapine zydis (ZYPREXA) 10 MG disintegrating tablet, Take 10 mg by mouth at bedtime. , Disp: , Rfl:  No Known Allergies Social History   Occupational History  . Not on file  Tobacco Use  . Smoking status: Never Smoker  . Smokeless tobacco: Never Used  Substance and Sexual Activity  . Alcohol use: No  . Drug use: No  . Sexual activity: Not on file    Objective:    Constitutional Bob Reyes is a pleasant 39 y.o. African American male, WD, WN in NAD.Marland Kitchen AAO x 3.   Vascular Capillary refill time to digits immediate b/l. Palpable pedal pulses b/l LE. Pedal hair present. Lower extremity skin temperature gradient within normal limits. No pain with calf compression b/l. No edema noted b/l lower extremities.  No cyanosis or clubbing noted.  Neurologic Normal speech. Oriented to person, place, and time. Epicritic sensation to light touch grossly present bilaterally. Protective sensation intact 5/5 intact bilaterally with 10g monofilament b/l. Vibratory sensation intact b/l.  Dermatologic Pedal skin with normal turgor, texture and tone bilaterally. No open wounds bilaterally. No interdigital macerations bilaterally. Toenails 1-5 b/l elongated, discolored, dystrophic, thickened, crumbly with subungual debris and tenderness to dorsal palpation. Hyperkeratotic lesion(s) plantar aspect b/l heel pads.  No erythema, no edema, no drainage, no flocculence.  Orthopedic: Normal muscle strength 5/5 to all lower extremity muscle groups bilaterally. No pain crepitus or joint limitation noted with ROM b/l. No gross bony deformities bilaterally. Patient ambulates independent of any assistive aids.   Radiographs: None Assessment:   1. Pain due to onychomycosis of toenails of both feet   2. Callus   3. Pain in both feet    Plan:  Patient was evaluated and treated and all questions answered.  Onychomycosis with pain -Nails palliatively debridement as below -Educated on self-care  Procedure: Nail Debridement Rationale: Pain Type of Debridement: manual, sharp debridement. Instrumentation: Nail nipper, rotary burr. Number of Nails: 10 -Examined patient. -No new findings. No new orders. -Medicaid  ABN signed for this year. Copy given to patient on today's visit and copy placed in patient chart. -Toenails 1-5 b/l were debrided in length and girth with sterile nail nippers  and dremel without iatrogenic bleeding.  -Callus(es) plantar aspect b/l heel pads pared utilizing sterile scalpel blade without complication or incident. Total number debrided =2. Patient instructed to apply Revitaderm Cream to calluses daily to manage plantar calluses. -Patient to report any pedal injuries to medical professional immediately. -Patient to continue soft, supportive shoe gear daily. -Patient/POA to call should there be question/concern in the interim.  Return in about 3 months (around 12/24/2019) for nail trim.  Freddie Breech, DPM

## 2019-11-02 ENCOUNTER — Other Ambulatory Visit: Payer: Self-pay

## 2019-11-02 ENCOUNTER — Emergency Department (HOSPITAL_COMMUNITY): Payer: Medicaid Other

## 2019-11-02 ENCOUNTER — Inpatient Hospital Stay (HOSPITAL_COMMUNITY)
Admission: EM | Admit: 2019-11-02 | Discharge: 2019-11-07 | DRG: 638 | Disposition: A | Discharge status: Home / Self Care | Payer: Medicaid Other | Attending: Internal Medicine | Admitting: Internal Medicine

## 2019-11-02 ENCOUNTER — Encounter (HOSPITAL_COMMUNITY): Payer: Self-pay | Admitting: Emergency Medicine

## 2019-11-02 DIAGNOSIS — E876 Hypokalemia: Secondary | ICD-10-CM | POA: Diagnosis present

## 2019-11-02 DIAGNOSIS — N179 Acute kidney failure, unspecified: Secondary | ICD-10-CM | POA: Diagnosis present

## 2019-11-02 DIAGNOSIS — R748 Abnormal levels of other serum enzymes: Secondary | ICD-10-CM | POA: Diagnosis present

## 2019-11-02 DIAGNOSIS — R358 Other polyuria: Secondary | ICD-10-CM | POA: Diagnosis present

## 2019-11-02 DIAGNOSIS — I129 Hypertensive chronic kidney disease with stage 1 through stage 4 chronic kidney disease, or unspecified chronic kidney disease: Secondary | ICD-10-CM | POA: Diagnosis present

## 2019-11-02 DIAGNOSIS — Z79899 Other long term (current) drug therapy: Secondary | ICD-10-CM | POA: Diagnosis not present

## 2019-11-02 DIAGNOSIS — E875 Hyperkalemia: Secondary | ICD-10-CM | POA: Diagnosis present

## 2019-11-02 DIAGNOSIS — N289 Disorder of kidney and ureter, unspecified: Secondary | ICD-10-CM | POA: Diagnosis not present

## 2019-11-02 DIAGNOSIS — E1122 Type 2 diabetes mellitus with diabetic chronic kidney disease: Secondary | ICD-10-CM | POA: Diagnosis present

## 2019-11-02 DIAGNOSIS — E1110 Type 2 diabetes mellitus with ketoacidosis without coma: Principal | ICD-10-CM | POA: Diagnosis present

## 2019-11-02 DIAGNOSIS — E878 Other disorders of electrolyte and fluid balance, not elsewhere classified: Secondary | ICD-10-CM

## 2019-11-02 DIAGNOSIS — Z20822 Contact with and (suspected) exposure to covid-19: Secondary | ICD-10-CM | POA: Diagnosis present

## 2019-11-02 DIAGNOSIS — R630 Anorexia: Secondary | ICD-10-CM | POA: Diagnosis present

## 2019-11-02 DIAGNOSIS — E869 Volume depletion, unspecified: Secondary | ICD-10-CM | POA: Diagnosis present

## 2019-11-02 DIAGNOSIS — E119 Type 2 diabetes mellitus without complications: Secondary | ICD-10-CM

## 2019-11-02 DIAGNOSIS — N189 Chronic kidney disease, unspecified: Secondary | ICD-10-CM | POA: Diagnosis present

## 2019-11-02 DIAGNOSIS — R631 Polydipsia: Secondary | ICD-10-CM | POA: Diagnosis present

## 2019-11-02 DIAGNOSIS — R680 Hypothermia, not associated with low environmental temperature: Secondary | ICD-10-CM | POA: Diagnosis present

## 2019-11-02 DIAGNOSIS — F200 Paranoid schizophrenia: Secondary | ICD-10-CM | POA: Diagnosis present

## 2019-11-02 DIAGNOSIS — E1100 Type 2 diabetes mellitus with hyperosmolarity without nonketotic hyperglycemic-hyperosmolar coma (NKHHC): Secondary | ICD-10-CM

## 2019-11-02 DIAGNOSIS — E1310 Other specified diabetes mellitus with ketoacidosis without coma: Secondary | ICD-10-CM | POA: Diagnosis not present

## 2019-11-02 DIAGNOSIS — E0810 Diabetes mellitus due to underlying condition with ketoacidosis without coma: Secondary | ICD-10-CM | POA: Diagnosis not present

## 2019-11-02 DIAGNOSIS — I447 Left bundle-branch block, unspecified: Secondary | ICD-10-CM | POA: Diagnosis present

## 2019-11-02 DIAGNOSIS — E1165 Type 2 diabetes mellitus with hyperglycemia: Secondary | ICD-10-CM | POA: Diagnosis not present

## 2019-11-02 DIAGNOSIS — E0910 Drug or chemical induced diabetes mellitus with ketoacidosis without coma: Secondary | ICD-10-CM | POA: Diagnosis not present

## 2019-11-02 LAB — URINALYSIS, ROUTINE W REFLEX MICROSCOPIC
Bilirubin Urine: NEGATIVE
Glucose, UA: >=500 mg/dL — AB
Ketones, ur: 20 mg/dL — AB
Leukocytes,Ua: NEGATIVE
Nitrite: NEGATIVE
Protein, ur: 30 mg/dL — AB
Specific Gravity, Urine: 1.016 (ref 1.005–1.030)
pH: 5 (ref 5.0–8.0)

## 2019-11-02 LAB — COMPREHENSIVE METABOLIC PANEL
ALT: 31 U/L (ref 0–44)
AST: 35 U/L (ref 15–41)
Albumin: 3.7 g/dL (ref 3.5–5.0)
Alkaline Phosphatase: 105 U/L (ref 38–126)
BUN: 49 mg/dL — ABNORMAL HIGH (ref 6–20)
CO2: <7 mmol/L — ABNORMAL LOW (ref 22–32)
Calcium: 8.3 mg/dL — ABNORMAL LOW (ref 8.9–10.3)
Chloride: 102 mmol/L (ref 98–111)
Creatinine, Ser: 3.4 mg/dL — ABNORMAL HIGH (ref 0.61–1.24)
GFR calc Af Amer: 25 mL/min — ABNORMAL LOW (ref >60)
GFR calc non Af Amer: 21 mL/min — ABNORMAL LOW (ref >60)
Glucose, Bld: 1116 mg/dL (ref 70–99)
Potassium: 7.3 mmol/L (ref 3.5–5.1)
Sodium: 130 mmol/L — ABNORMAL LOW (ref 135–145)
Total Bilirubin: 2.9 mg/dL — ABNORMAL HIGH (ref 0.3–1.2)
Total Protein: 6.4 g/dL — ABNORMAL LOW (ref 6.5–8.1)

## 2019-11-02 LAB — CBC WITH DIFFERENTIAL/PLATELET
Abs Immature Granulocytes: 1.13 10*3/uL — ABNORMAL HIGH (ref 0.00–0.07)
Basophils Absolute: 0.1 10*3/uL (ref 0.0–0.1)
Basophils Relative: 0 %
Eosinophils Absolute: 0 10*3/uL (ref 0.0–0.5)
Eosinophils Relative: 0 %
HCT: 54.2 % — ABNORMAL HIGH (ref 39.0–52.0)
Hemoglobin: 16.9 g/dL (ref 13.0–17.0)
Immature Granulocytes: 4 %
Lymphocytes Relative: 9 %
Lymphs Abs: 2.4 10*3/uL (ref 0.7–4.0)
MCH: 29.1 pg (ref 26.0–34.0)
MCHC: 31.2 g/dL (ref 30.0–36.0)
MCV: 93.4 fL (ref 80.0–100.0)
Monocytes Absolute: 3.3 10*3/uL — ABNORMAL HIGH (ref 0.1–1.0)
Monocytes Relative: 12 %
Neutro Abs: 20.5 10*3/uL — ABNORMAL HIGH (ref 1.7–7.7)
Neutrophils Relative %: 75 %
Platelets: 371 10*3/uL (ref 150–400)
RBC: 5.8 MIL/uL (ref 4.22–5.81)
RDW: 13.2 % (ref 11.5–15.5)
WBC: 27.4 10*3/uL — ABNORMAL HIGH (ref 4.0–10.5)
nRBC: 0 % (ref 0.0–0.2)

## 2019-11-02 LAB — I-STAT ARTERIAL BLOOD GAS, ED
Acid-base deficit: 28 mmol/L — ABNORMAL HIGH (ref 0.0–2.0)
Bicarbonate: 3.4 mmol/L — ABNORMAL LOW (ref 20.0–28.0)
Calcium, Ion: 1.28 mmol/L (ref 1.15–1.40)
HCT: 53 % — ABNORMAL HIGH (ref 39.0–52.0)
Hemoglobin: 18 g/dL — ABNORMAL HIGH (ref 13.0–17.0)
O2 Saturation: 98 %
Patient temperature: 88.1
Potassium: 5.4 mmol/L — ABNORMAL HIGH (ref 3.5–5.1)
Sodium: 127 mmol/L — ABNORMAL LOW (ref 135–145)
TCO2: <5 mmol/L — ABNORMAL LOW (ref 22–32)
pCO2 arterial: <15 mmHg — CL (ref 32.0–48.0)
pH, Arterial: 6.998 — CL (ref 7.350–7.450)
pO2, Arterial: 132 mmHg — ABNORMAL HIGH (ref 83.0–108.0)

## 2019-11-02 LAB — CBG MONITORING, ED
Glucose-Capillary: >600 mg/dL (ref 70–99)
Glucose-Capillary: >600 mg/dL (ref 70–99)
Glucose-Capillary: >600 mg/dL (ref 70–99)
Glucose-Capillary: >600 mg/dL (ref 70–99)
Glucose-Capillary: >600 mg/dL (ref 70–99)
Glucose-Capillary: 578 mg/dL (ref 70–99)

## 2019-11-02 LAB — LACTIC ACID, PLASMA
Lactic Acid, Venous: 2 mmol/L (ref 0.5–1.9)
Lactic Acid, Venous: 1.9 mmol/L (ref 0.5–1.9)

## 2019-11-02 LAB — PROTIME-INR
INR: 1.3 — ABNORMAL HIGH (ref 0.8–1.2)
Prothrombin Time: 16 seconds — ABNORMAL HIGH (ref 11.4–15.2)

## 2019-11-02 LAB — BETA-HYDROXYBUTYRIC ACID: Beta-Hydroxybutyric Acid: >8 mmol/L — ABNORMAL HIGH (ref 0.05–0.27)

## 2019-11-02 LAB — SARS CORONAVIRUS 2 BY RT PCR (HOSPITAL ORDER, PERFORMED IN ~~LOC~~ HOSPITAL LAB): SARS Coronavirus 2: NEGATIVE

## 2019-11-02 LAB — APTT: aPTT: 26 seconds (ref 24–36)

## 2019-11-02 LAB — I-STAT CREATININE, ED: Creatinine, Ser: 2.6 mg/dL — ABNORMAL HIGH (ref 0.61–1.24)

## 2019-11-02 MED ORDER — METRONIDAZOLE IN NACL 5-0.79 MG/ML-% IV SOLN
500.0000 mg | Freq: Once | INTRAVENOUS | Status: AC
  Administered 2019-11-02: 500 mg via INTRAVENOUS
  Filled 2019-11-02: qty 100

## 2019-11-02 MED ORDER — POLYETHYLENE GLYCOL 3350 17 G PO PACK: 17.0000 g | PACK | Freq: Every day | ORAL | Status: DC | PRN

## 2019-11-02 MED ORDER — DOCUSATE SODIUM 100 MG PO CAPS: 100.0000 mg | ORAL_CAPSULE | Freq: Two times a day (BID) | ORAL | Status: DC | PRN

## 2019-11-02 MED ORDER — INSULIN REGULAR(HUMAN) IN NACL 100-0.9 UT/100ML-% IV SOLN
INTRAVENOUS | Status: DC
  Administered 2019-11-02: 11.5 [IU]/h via INTRAVENOUS
  Administered 2019-11-03: 8 [IU]/h via INTRAVENOUS
  Administered 2019-11-03: 4.8 [IU]/h via INTRAVENOUS
  Filled 2019-11-02 (×5): qty 100

## 2019-11-02 MED ORDER — SODIUM CHLORIDE 0.9 % IV BOLUS (SEPSIS): 1000.0000 mL | Freq: Once | INTRAVENOUS | Status: DC

## 2019-11-02 MED ORDER — SODIUM CHLORIDE 0.9 % IV BOLUS (SEPSIS)
1000.0000 mL | Freq: Once | INTRAVENOUS | Status: AC
  Administered 2019-11-02: 1000 mL via INTRAVENOUS

## 2019-11-02 MED ORDER — LACTATED RINGERS IV SOLN: INTRAVENOUS | Status: DC

## 2019-11-02 MED ORDER — VANCOMYCIN HCL IN DEXTROSE 1-5 GM/200ML-% IV SOLN: 1000.0000 mg | Freq: Once | INTRAVENOUS | Status: DC

## 2019-11-02 MED ORDER — DEXTROSE 50 % IV SOLN: 0.0000 mL | INTRAVENOUS | Status: DC | PRN

## 2019-11-02 MED ORDER — VANCOMYCIN HCL 2000 MG/400ML IV SOLN
2000.0000 mg | Freq: Once | INTRAVENOUS | Status: DC
  Filled 2019-11-02: qty 400

## 2019-11-02 MED ORDER — SODIUM CHLORIDE 0.9 % IV BOLUS (SEPSIS): 500.0000 mL | Freq: Once | INTRAVENOUS | Status: DC

## 2019-11-02 MED ORDER — SODIUM CHLORIDE 0.9 % IV SOLN
2.0000 g | Freq: Once | INTRAVENOUS | Status: AC
  Administered 2019-11-02: 2 g via INTRAVENOUS
  Filled 2019-11-02: qty 2

## 2019-11-02 MED ORDER — HEPARIN SODIUM (PORCINE) 5000 UNIT/ML IJ SOLN
5000.0000 [IU] | Freq: Three times a day (TID) | INTRAMUSCULAR | Status: DC
  Administered 2019-11-02 – 2019-11-07 (×14): 5000 [IU] via SUBCUTANEOUS
  Filled 2019-11-02 (×14): qty 1

## 2019-11-02 MED ORDER — LACTATED RINGERS IV BOLUS
1000.0000 mL | Freq: Once | INTRAVENOUS | Status: AC
  Administered 2019-11-02: 1000 mL via INTRAVENOUS

## 2019-11-02 MED ORDER — DEXTROSE IN LACTATED RINGERS 5 % IV SOLN: INTRAVENOUS | Status: DC

## 2019-11-02 MED ORDER — OLANZAPINE 5 MG PO TBDP
10.0000 mg | ORAL_TABLET | Freq: Every day | ORAL | Status: DC
  Administered 2019-11-02: 10 mg via ORAL
  Filled 2019-11-02: qty 2

## 2019-11-02 NOTE — ED Triage Notes (Addendum)
Pt arrived via EMS from home found sitting on floor in underwear. EMS reports pt being lethargic and weak, pt c/o thirst. Pt reports lack of urination for the last 2 days and has only been drinking orange juice. EMS reports temp of 91 on their arrival. Pt given 1L of NS by EMS initial bp of 90 palp on their arrival and capnography of 10. EMS reports bgl of 498.

## 2019-11-02 NOTE — H&P (Signed)
NAME:  Bob Reyes, MRN:  [id number], DOB:  [birth year], LOS: 0 ADMISSION DATE:  11/02/2019,   CHIEF COMPLAINT:  ALOC   Brief History   This is a 39y/o male that presented to the ER with ALOC  History of present illness   39y/o black male that presented to the ER from home.  The pt lives alone but his mother checks on him several times a day.  He has a hx of schizophrenia.  The pts mother found the pt sitting on the floor and was mumbling incoherently.    Once in the ER BS >600 ph 6.99   Recent symptoms: polydipsia and polyuria  + nausea,  Pt is not sure when it started. + anorexia and possible diarrhea  No fevers/weightloss/ sore throat/abd pain/chills/cough/dysuria   Schizophrenia: hx:  He has had multiple psychotic breaks in the past.  Broken both ankles jumping out of second story window.  Has been compliant and stabilized since he started on zyprexa  Past Medical History  Schizophrenia  HTN  Procedures:  none  Significant Diagnostic Tests:  none  Micro Data:  COVID negative    Objective   Blood pressure 114/66, pulse 66, temperature (!) 88.1 F (31.2 C), temperature source Rectal, resp. rate 18, SpO2 100 %.       No intake or output data in the 24 hours ending 11/02/19 2008 There were no vitals filed for this visit.  Examination: General: No acute distress.  Alert and awake HENT: Atraumatic normocephalic Lungs: CTAB no rales/rhonchi/wheezing Cardiovascular: RR no rub gallop or murmur Abdomen: soft nontender nondistended.  No R/R/G +BS no organomegly Extremities: distal pulses intact x4  No cyanosis or clubbing Neuro: awake and alert,  CAxOx3 but confused on other details   Assessment & Plan:  New dx of DM DKA Paranoid Schizophrenia HTN Acute renal failure   Plan: Admit to the ICU Send amylase and lipase now DKA insulin order set was started- endotool Q4 bmp The pt has a signficant hx of psychotic breaks but has stabilized since starting Zyprexia.   Though this may have contributed to the DKA will continue until psychiatry can assist with transition to another drug.  No evidence of infection at this time Closely monitor I/O Can use Labetalol and hydralazine to control the bp   Best practice:  Diet: clear liquid diet Pain/Anxiety/Delirium protocol (if indicated):  VAP protocol (if indicated): n/a DVT prophylaxis: Heparin GI prophylaxis:  Glucose control: DKA endotool Mobility: OOB  Code Status: Full  Family Communication: Updated the mother at bedside Disposition: admit to ICU  Labs   CBC: Recent Labs  Lab 11/02/19 1720 11/02/19 1740  WBC 27.4*  --   NEUTROABS 20.5*  --   HGB 16.9 18.0*  HCT 54.2* 53.0*  MCV 93.4  --   PLT 371  --     Basic Metabolic Panel: Recent Labs  Lab 11/02/19 1740 11/02/19 2000  NA 127*  --   K 5.4*  --   CREATININE  --  2.60*   GFR: CrCl cannot be calculated (Unknown ideal weight.). Recent Labs  Lab 11/02/19 1720  WBC 27.4*  LATICACIDVEN SPECIMEN HEMOLYZED. HEMOLYSIS MAY AFFECT INTEGRITY OF RESULTS.    Liver Function Tests: No results for input(s): AST, ALT, ALKPHOS, BILITOT, PROT, ALBUMIN in the last 168 hours. No results for input(s): LIPASE, AMYLASE in the last 168 hours. No results for input(s): AMMONIA in the last 168 hours.  ABG    Component Value Date/Time  PHART 6.998 (LL) 11/02/2019 1740   PCO2ART <15.0 (LL) 11/02/2019 1740   PO2ART 132 (H) 11/02/2019 1740   HCO3 3.4 (L) 11/02/2019 1740   TCO2 <5 (L) 11/02/2019 1740   ACIDBASEDEF 28.0 (H) 11/02/2019 1740   O2SAT 98.0 11/02/2019 1740     Coagulation Profile: Recent Labs  Lab 11/02/19 1925  INR 1.3*    Cardiac Enzymes: No results for input(s): CKTOTAL, CKMB, CKMBINDEX, TROPONINI in the last 168 hours.  HbA1C: No results found for: HGBA1C  CBG: Recent Labs  Lab 11/02/19 1716 11/02/19 1935  GLUCAP >600* >600*    Review of Systems:   See hpi  Past Medical History  He,  has a past medical  history of Closed fracture of bone of left foot, Closed fracture of bone of right foot, Constipation due to opioid therapy (07/05/2016), and Paranoid schizophrenia, chronic condition (HCC) (06/28/2016).   Surgical History    Past Surgical History:  Procedure Laterality Date  . ORIF CALCANEOUS FRACTURE Left 06/29/2016   Procedure: OPEN REDUCTION INTERNAL FIXATION (ORIF) CALCANEOUS FRACTURE;  Surgeon: Eldred Manges, MD;  Location: [facility] OR;  Service: Orthopedics;  Laterality: Left;  . ORIF CALCANEOUS FRACTURE Right 07/01/2016   Procedure: OPEN REDUCTION INTERNAL FIXATION (ORIF) RIGHT CALCANEOUS FRACTURE;  Surgeon: Eldred Manges, MD;  Location: [facility] OR;  Service: Orthopedics;  Laterality: Right;     Social History   reports that he has never smoked. He has never used smokeless tobacco. He reports that he does not drink alcohol and does not use drugs.   Family History   His family history is not on file.   Allergies No Known Allergies   Home Medications  Prior to Admission medications   Medication Sig Start Date End Date Taking? Authorizing Provider  ciclopirox (LOPROX) 0.77 % cream Apply topically 2 (two) times daily. 05/02/18   Freddie Breech, DPM  lisinopril (PRINIVIL,ZESTRIL) 10 MG tablet Take 10 mg by mouth daily.    [provider]  OLANZapine zydis (ZYPREXA) 10 MG disintegrating tablet Take 10 mg by mouth at bedtime.     [provider]     Critical care time: 69

## 2019-11-02 NOTE — ED Provider Notes (Signed)
[facility] EMERGENCY DEPARTMENT Provider Note   CSN: [id number] Arrival date & time: 11/02/19  1700     History Chief Complaint  Patient presents with  . Fatigue  . Hyperglycemia    Bob Reyes is a 39 y.o. male.  HPI    Level 5 caveat- patient with schizophrenia and altered mental status History obtained from ems 39 yo male ho schizophrenia with report mother found him on the ground and had not been seen for two days.  EMS reports bs>500, temp cool and less than 91, sbp 90s.  Initiated by ems as code sepsis.  No ho dm. Patient awake but appears to have difficulty answering questions. No obvious trauma or deficits 6:16 PM Mother at bedside.  States he has been very thirsty for 2 weeks.  He was in his normal state Thursday.  Today he was less responsive, but did complain of thirst.  Past Medical History:  Diagnosis Date  . Closed fracture of bone of left foot   . Closed fracture of bone of right foot   . Constipation due to opioid therapy 07/05/2016  . Paranoid schizophrenia, chronic condition (HCC) 06/28/2016    Patient Active Problem List   Diagnosis Date Noted  . Weakness 01/16/2017  . Essential hypertension, benign 09/08/2016  . S/P ORIF (open reduction internal fixation) fracture 08/19/2016  . Constipation due to opioid therapy 07/05/2016  . Closed fracture of bone of right foot   . Closed fracture of bone of left foot   . Paranoid schizophrenia, chronic condition (HCC) 06/28/2016  . Bilateral calcaneal fractures 06/27/2016    Past Surgical History:  Procedure Laterality Date  . ORIF CALCANEOUS FRACTURE Left 06/29/2016   Procedure: OPEN REDUCTION INTERNAL FIXATION (ORIF) CALCANEOUS FRACTURE;  Surgeon: Eldred Manges, MD;  Location: [facility] OR;  Service: Orthopedics;  Laterality: Left;  . ORIF CALCANEOUS FRACTURE Right 07/01/2016   Procedure: OPEN REDUCTION INTERNAL FIXATION (ORIF) RIGHT CALCANEOUS FRACTURE;  Surgeon: Eldred Manges, MD;  Location: [facility]  OR;  Service: Orthopedics;  Laterality: Right;       History reviewed. No pertinent family history.  Social History   Tobacco Use  . Smoking status: Never Smoker  . Smokeless tobacco: Never Used  Substance Use Topics  . Alcohol use: No  . Drug use: No    Home Medications Prior to Admission medications   Medication Sig Start Date End Date Taking? Authorizing Provider  ciclopirox (LOPROX) 0.77 % cream Apply topically 2 (two) times daily. 05/02/18   Freddie Breech, DPM  lisinopril (PRINIVIL,ZESTRIL) 10 MG tablet Take 10 mg by mouth daily.    [provider]  OLANZapine zydis (ZYPREXA) 10 MG disintegrating tablet Take 10 mg by mouth at bedtime.     [provider]    Allergies    Patient has no known allergies.  Review of Systems   Review of Systems  Unable to perform ROS: Acuity of condition    Physical Exam Updated Vital Signs There were no vitals taken for this visit.  Physical Exam Vitals and nursing note reviewed.  Constitutional:      General: He is in acute distress.     Appearance: He is obese. He is ill-appearing.  HENT:     Head: Normocephalic and atraumatic.     Right Ear: External ear normal.     Left Ear: External ear normal.     Nose: Nose normal.     Mouth/Throat:     Mouth: Mucous  membranes are dry.  Eyes:     Pupils: Pupils are equal, round, and reactive to light.  Cardiovascular:     Rate and Rhythm: Tachycardia present.  Pulmonary:     Comments: tachypnea Abdominal:     General: Abdomen is flat.     Palpations: Abdomen is soft.  Musculoskeletal:        General: Normal range of motion.     Cervical back: Normal range of motion.  Skin:    General: Skin is dry.     Capillary Refill: Capillary refill takes 2 to 3 seconds.     Comments: Cool  Neurological:     General: No focal deficit present.     Mental Status: He is alert.     ED Results / Procedures / Treatments   Labs (all labs ordered are listed, but only  abnormal results are displayed) Labs Reviewed  CULTURE, BLOOD (ROUTINE X 2)  CULTURE, BLOOD (ROUTINE X 2)  URINE CULTURE  SARS CORONAVIRUS 2 BY RT PCR (HOSPITAL ORDER, PERFORMED IN [facility])  LACTIC ACID, PLASMA  LACTIC ACID, PLASMA  COMPREHENSIVE METABOLIC PANEL  CBC WITH DIFFERENTIAL/PLATELET  PROTIME-INR  APTT  URINALYSIS, ROUTINE W REFLEX MICROSCOPIC    EKG EKG Interpretation  Date/Time:  Saturday November 02 2019 17:11:54 EDT Ventricular Rate:  62 PR Interval:    QRS Duration: 132 QT Interval:  424 QTC Calculation: 431 R Axis:   78 Text Interpretation: Sinus rhythm Left bundle branch block twave inversion inferior and lateral leads Confirmed by Margarita Grizzle [phone]) on 11/02/2019 6:59:34 PM   Radiology DG Chest Port 1 View  Result Date: 11/02/2019 CLINICAL DATA:  Questionable sepsis. EXAM: PORTABLE CHEST 1 VIEW COMPARISON:  None. FINDINGS: The heart size and mediastinal contours are within normal limits. Both lungs are clear. The visualized skeletal structures are unremarkable. IMPRESSION: No active disease. Electronically Signed   By: Gerome Sam III M.D   On: 11/02/2019 18:03    Procedures .Critical Care Performed by: Margarita Grizzle, MD Authorized by: Margarita Grizzle, MD   Critical care provider statement:    Critical care time (minutes):  65   Critical care was necessary to treat or prevent imminent or life-threatening deterioration of the following conditions:  Sepsis, dehydration and endocrine crisis   Critical care was time spent personally by me on the following activities:  Discussions with consultants, evaluation of patient's response to treatment, examination of patient, ordering and performing treatments and interventions, ordering and review of laboratory studies, ordering and review of radiographic studies, pulse oximetry, re-evaluation of patient's condition, obtaining history from patient or surrogate and review of old charts    (including critical care time)  Medications Ordered in ED Medications  lactated ringers infusion (has no administration in time range)  sodium chloride 0.9 % bolus 1,000 mL (has no administration in time range)    And  sodium chloride 0.9 % bolus 1,000 mL (has no administration in time range)    And  sodium chloride 0.9 % bolus 1,000 mL (has no administration in time range)    And  sodium chloride 0.9 % bolus 500 mL (has no administration in time range)  ceFEPIme (MAXIPIME) 2 g in sodium chloride 0.9 % 100 mL IVPB (has no administration in time range)  metroNIDAZOLE (FLAGYL) IVPB 500 mg (has no administration in time range)  vancomycin (VANCOCIN) IVPB 1000 mg/200 mL premix (has no administration in time range)    ED Course  I have reviewed the triage  vital signs and the nursing notes.  Pertinent labs & imaging results that were available during my care of the patient were reviewed by me and considered in my medical decision making (see chart for details).  Clinical Course as of Nov 01 2044  Sat Nov 02, 2019  2045 Labs reviewed and abnormalities noted IV fluids ordered Will need all electrolytes closely monitored Creatinine elevate   [DR]    Clinical Course User Index [DR] Margarita Grizzle, MD   MDM Rules/Calculators/A&P                          Patient with ho schizophrenia and hypertension presents with weeks of increased thirst now with ams, hypothermia, bs >600, ph 6.99 c.w new onset DKA.  Precipitating causes ? Infection/cardiac/medication/pancreatitis/tox Patient is on olanzapine- with some association with new onset dm Abnormal ekg will add troponin Patient hypothermic with leukocytosis, broad spectrum abx and fluids initiated 1- dka Electrolyte abnormalities c.w. DKA 2- sepsis 3- hypothermia-likely secondary to #2  7:19 PM Discussed with Dr. Dellie Catholic and critical care will evaluate 7:42 PM Vitals:   11/02/19 1715 11/02/19 1745  BP: 100/78 114/66  Pulse: 64 66    Resp: (!) 24 18  Temp:    SpO2: 100% 100%   Patient sitting up and asking for water RN redrawing labs-lactic and cmet hemolyzed HR normal Temp foley ordered  Vitals:   11/02/19 2000 11/02/19 2030  BP: 140/77   Pulse: 84 83  Resp: (!) 25 (!) 22  Temp:  (!) 93 F (33.9 C)  SpO2: 100% 100%   Critical care consulted and admitting    Final Clinical Impression(s) / ED Diagnoses Final diagnoses:  Diabetic ketoacidosis without coma associated with other specified diabetes mellitus (HCC)  Acute renal failure, unspecified acute renal failure type (HCC)  Electrolyte abnormality    Rx / DC Orders ED Discharge Orders    None       Margarita Grizzle, MD 11/02/19 2048

## 2019-11-03 DIAGNOSIS — E0910 Drug or chemical induced diabetes mellitus with ketoacidosis without coma: Secondary | ICD-10-CM

## 2019-11-03 LAB — HIV ANTIBODY (ROUTINE TESTING W REFLEX): HIV Screen 4th Generation wRfx: NONREACTIVE

## 2019-11-03 LAB — BASIC METABOLIC PANEL
Anion gap: 15 (ref 5–15)
Anion gap: 12 (ref 5–15)
Anion gap: 12 (ref 5–15)
BUN: 52 mg/dL — ABNORMAL HIGH (ref 6–20)
BUN: 51 mg/dL — ABNORMAL HIGH (ref 6–20)
BUN: 50 mg/dL — ABNORMAL HIGH (ref 6–20)
BUN: 51 mg/dL — ABNORMAL HIGH (ref 6–20)
BUN: 49 mg/dL — ABNORMAL HIGH (ref 6–20)
CO2: <7 mmol/L — ABNORMAL LOW (ref 22–32)
CO2: <7 mmol/L — ABNORMAL LOW (ref 22–32)
CO2: 12 mmol/L — ABNORMAL LOW (ref 22–32)
CO2: 15 mmol/L — ABNORMAL LOW (ref 22–32)
CO2: 15 mmol/L — ABNORMAL LOW (ref 22–32)
Calcium: 8.7 mg/dL — ABNORMAL LOW (ref 8.9–10.3)
Calcium: 9.3 mg/dL (ref 8.9–10.3)
Calcium: 9.1 mg/dL (ref 8.9–10.3)
Calcium: 9.7 mg/dL (ref 8.9–10.3)
Calcium: 9.4 mg/dL (ref 8.9–10.3)
Chloride: 110 mmol/L (ref 98–111)
Chloride: 109 mmol/L (ref 98–111)
Chloride: 120 mmol/L — ABNORMAL HIGH (ref 98–111)
Chloride: 119 mmol/L — ABNORMAL HIGH (ref 98–111)
Chloride: 118 mmol/L — ABNORMAL HIGH (ref 98–111)
Creatinine, Ser: 3.42 mg/dL — ABNORMAL HIGH (ref 0.61–1.24)
Creatinine, Ser: 3.03 mg/dL — ABNORMAL HIGH (ref 0.61–1.24)
Creatinine, Ser: 2.9 mg/dL — ABNORMAL HIGH (ref 0.61–1.24)
Creatinine, Ser: 3.02 mg/dL — ABNORMAL HIGH (ref 0.61–1.24)
Creatinine, Ser: 2.88 mg/dL — ABNORMAL HIGH (ref 0.61–1.24)
GFR calc Af Amer: 25 mL/min — ABNORMAL LOW (ref >60)
GFR calc Af Amer: 28 mL/min — ABNORMAL LOW (ref >60)
GFR calc Af Amer: 30 mL/min — ABNORMAL LOW (ref >60)
GFR calc Af Amer: 29 mL/min — ABNORMAL LOW (ref >60)
GFR calc Af Amer: 30 mL/min — ABNORMAL LOW (ref >60)
GFR calc non Af Amer: 21 mL/min — ABNORMAL LOW (ref >60)
GFR calc non Af Amer: 25 mL/min — ABNORMAL LOW (ref >60)
GFR calc non Af Amer: 26 mL/min — ABNORMAL LOW (ref >60)
GFR calc non Af Amer: 25 mL/min — ABNORMAL LOW (ref >60)
GFR calc non Af Amer: 26 mL/min — ABNORMAL LOW (ref >60)
Glucose, Bld: 698 mg/dL (ref 70–99)
Glucose, Bld: 460 mg/dL — ABNORMAL HIGH (ref 70–99)
Glucose, Bld: 250 mg/dL — ABNORMAL HIGH (ref 70–99)
Glucose, Bld: 241 mg/dL — ABNORMAL HIGH (ref 70–99)
Glucose, Bld: 244 mg/dL — ABNORMAL HIGH (ref 70–99)
Potassium: 4.6 mmol/L (ref 3.5–5.1)
Potassium: 4.4 mmol/L (ref 3.5–5.1)
Potassium: 3.2 mmol/L — ABNORMAL LOW (ref 3.5–5.1)
Potassium: 3.3 mmol/L — ABNORMAL LOW (ref 3.5–5.1)
Potassium: 2.9 mmol/L — ABNORMAL LOW (ref 3.5–5.1)
Sodium: 139 mmol/L (ref 135–145)
Sodium: 141 mmol/L (ref 135–145)
Sodium: 147 mmol/L — ABNORMAL HIGH (ref 135–145)
Sodium: 146 mmol/L — ABNORMAL HIGH (ref 135–145)
Sodium: 145 mmol/L (ref 135–145)

## 2019-11-03 LAB — GLUCOSE, CAPILLARY
Glucose-Capillary: 194 mg/dL — ABNORMAL HIGH (ref 70–99)
Glucose-Capillary: 209 mg/dL — ABNORMAL HIGH (ref 70–99)
Glucose-Capillary: 222 mg/dL — ABNORMAL HIGH (ref 70–99)
Glucose-Capillary: 227 mg/dL — ABNORMAL HIGH (ref 70–99)
Glucose-Capillary: 230 mg/dL — ABNORMAL HIGH (ref 70–99)
Glucose-Capillary: 231 mg/dL — ABNORMAL HIGH (ref 70–99)
Glucose-Capillary: 234 mg/dL — ABNORMAL HIGH (ref 70–99)
Glucose-Capillary: 234 mg/dL — ABNORMAL HIGH (ref 70–99)
Glucose-Capillary: 238 mg/dL — ABNORMAL HIGH (ref 70–99)
Glucose-Capillary: 239 mg/dL — ABNORMAL HIGH (ref 70–99)
Glucose-Capillary: 243 mg/dL — ABNORMAL HIGH (ref 70–99)
Glucose-Capillary: 245 mg/dL — ABNORMAL HIGH (ref 70–99)
Glucose-Capillary: 245 mg/dL — ABNORMAL HIGH (ref 70–99)
Glucose-Capillary: 248 mg/dL — ABNORMAL HIGH (ref 70–99)
Glucose-Capillary: 255 mg/dL — ABNORMAL HIGH (ref 70–99)
Glucose-Capillary: 256 mg/dL — ABNORMAL HIGH (ref 70–99)
Glucose-Capillary: 259 mg/dL — ABNORMAL HIGH (ref 70–99)
Glucose-Capillary: 261 mg/dL — ABNORMAL HIGH (ref 70–99)
Glucose-Capillary: 350 mg/dL — ABNORMAL HIGH (ref 70–99)
Glucose-Capillary: 401 mg/dL — ABNORMAL HIGH (ref 70–99)
Glucose-Capillary: 455 mg/dL — ABNORMAL HIGH (ref 70–99)
Glucose-Capillary: 494 mg/dL — ABNORMAL HIGH (ref 70–99)
Glucose-Capillary: 591 mg/dL (ref 70–99)
Glucose-Capillary: >600 mg/dL (ref 70–99)

## 2019-11-03 LAB — BETA-HYDROXYBUTYRIC ACID
Beta-Hydroxybutyric Acid: >8 mmol/L — ABNORMAL HIGH (ref 0.05–0.27)
Beta-Hydroxybutyric Acid: 3.19 mmol/L — ABNORMAL HIGH (ref 0.05–0.27)

## 2019-11-03 LAB — CBC
HCT: 50.5 % (ref 39.0–52.0)
Hemoglobin: 16.6 g/dL (ref 13.0–17.0)
MCH: 28.7 pg (ref 26.0–34.0)
MCHC: 32.9 g/dL (ref 30.0–36.0)
MCV: 87.2 fL (ref 80.0–100.0)
Platelets: 263 10*3/uL (ref 150–400)
RBC: 5.79 MIL/uL (ref 4.22–5.81)
RDW: 13 % (ref 11.5–15.5)
WBC: 24.3 10*3/uL — ABNORMAL HIGH (ref 4.0–10.5)
nRBC: 0 % (ref 0.0–0.2)

## 2019-11-03 LAB — AMYLASE: Amylase: 462 U/L — ABNORMAL HIGH (ref 28–100)

## 2019-11-03 LAB — MRSA PCR SCREENING: MRSA by PCR: NEGATIVE

## 2019-11-03 LAB — OSMOLALITY: Osmolality: 354 mOsm/kg (ref 275–295)

## 2019-11-03 LAB — LIPASE, BLOOD: Lipase: 991 U/L — ABNORMAL HIGH (ref 11–51)

## 2019-11-03 LAB — HEMOGLOBIN A1C
Hgb A1c MFr Bld: 11.7 % — ABNORMAL HIGH (ref 4.8–5.6)
Mean Plasma Glucose: 289.09 mg/dL

## 2019-11-03 MED ORDER — SODIUM CHLORIDE 0.9% FLUSH
10.0000 mL | Freq: Two times a day (BID) | INTRAVENOUS | Status: DC
  Administered 2019-11-03 – 2019-11-07 (×5): 10 mL

## 2019-11-03 MED ORDER — CHLORHEXIDINE GLUCONATE CLOTH 2 % EX PADS
6.0000 | MEDICATED_PAD | Freq: Every day | CUTANEOUS | Status: DC
  Administered 2019-11-03 – 2019-11-07 (×5): 6 via TOPICAL

## 2019-11-03 MED ORDER — POTASSIUM CHLORIDE 10 MEQ/100ML IV SOLN
10.0000 meq | INTRAVENOUS | Status: AC
  Administered 2019-11-04 (×4): 10 meq via INTRAVENOUS
  Filled 2019-11-03 (×4): qty 100

## 2019-11-03 MED ORDER — LIP MEDEX EX OINT
TOPICAL_OINTMENT | CUTANEOUS | Status: DC | PRN
  Filled 2019-11-03: qty 7

## 2019-11-03 MED ORDER — LACTATED RINGERS IV BOLUS
1000.0000 mL | Freq: Once | INTRAVENOUS | Status: AC
  Administered 2019-11-03: 1000 mL via INTRAVENOUS

## 2019-11-03 MED ORDER — HALOPERIDOL 5 MG PO TABS
5.0000 mg | ORAL_TABLET | Freq: Every day | ORAL | Status: DC
  Administered 2019-11-03 – 2019-11-06 (×4): 5 mg via ORAL
  Filled 2019-11-03 (×5): qty 1

## 2019-11-03 MED ORDER — SODIUM CHLORIDE 0.9% FLUSH: 10.0000 mL | INTRAVENOUS | Status: DC | PRN

## 2019-11-03 MED ORDER — RISPERIDONE 0.5 MG PO TABS: 2.0000 mg | ORAL_TABLET | Freq: Every day | ORAL | Status: DC

## 2019-11-03 NOTE — Consult Note (Signed)
[facility] Face-to-Face Psychiatry Consult   Reason for Consult:  ''Zyprexa-induced DKA, need another agent for patient's schizophrenia.'' Referring Physician:  Levon HedgerSmith Daniel, MD [facility] Bob GladdenJosh M Reyes MRN:  [id number] Principal Diagnosis: DKA (diabetic ketoacidoses) [facility]) Diagnosis:  Principal Problem:   DKA (diabetic ketoacidoses) (HCC) Active Problems:   Paranoid schizophrenia, chronic condition (HCC)   Total Time spent with patient: 1 hour  Subjective:   Bob Reyes is a 39 y.o. male patient admitted with hyperglycemia and fatigue.  HPI: Patient who reports medical problem which include Hypertension, S/P ORIF Calcaneous fracture and Schizophrenia. He states that the schizophrenia symptoms was controlled on Olanzepine but was admitted due to elevated blood sugar induced by Olanzapine. He states that he is on disability due to mental health, lives alone but his mother checks on him several times a day. He is alert, awake, cooperative, denies psychosis, delusions, self harming thoughts and open to replacing Olanzapine with another anti-psychotic.   Past Psychiatric History: as above  Risk to Self:  denies Risk to Others:  denies [facility] Out[facility]:  [facility] as reported by patient  Past Medical History:  Past Medical History:  Diagnosis Date  . Closed fracture of bone of left foot   . Closed fracture of bone of right foot   . Constipation due to opioid therapy 07/05/2016  . Paranoid schizophrenia, chronic condition (HCC) 06/28/2016    Past Surgical History:  Procedure Laterality Date  . ORIF CALCANEOUS FRACTURE Left 06/29/2016   Procedure: OPEN REDUCTION INTERNAL FIXATION (ORIF) CALCANEOUS FRACTURE;  Surgeon: Eldred MangesYates, Mark C, MD;  Location: [facility] OR;  Service: Orthopedics;  Laterality: Left;  . ORIF CALCANEOUS FRACTURE Right 07/01/2016   Procedure: OPEN REDUCTION INTERNAL FIXATION (ORIF) RIGHT CALCANEOUS  FRACTURE;  Surgeon: Eldred MangesYates, Mark C, MD;  Location: [facility] OR;  Service: Orthopedics;  Laterality: Right;   Family History: History reviewed. No pertinent family history. Family Psychiatric  History:  Social History:  Social History   Substance and Sexual Activity  Alcohol Use No     Social History   Substance and Sexual Activity  Drug Use No    Social History   Socioeconomic History  . Marital status: Single    Spouse name: Not on file  . Number of children: Not on file  . Years of education: Not on file  . Highest education level: Not on file  Occupational History  . Not on file  Tobacco Use  . Smoking status: Never Smoker  . Smokeless tobacco: Never Used  Substance and Sexual Activity  . Alcohol use: No  . Drug use: No  . Sexual activity: Not on file  Other Topics Concern  . Not on file  Social History Narrative  . Not on file   Social Determinants of Health   Financial Resource Strain:   . Difficulty of Paying Living Expenses: Not on file  Food Insecurity:   . Worried About Programme researcher, broadcasting/film/videounning Out of Food in the Last Year: Not on file  . Ran Out of Food in the Last Year: Not on file  Transportation Needs:   . Lack of Transportation (Medical): Not on file  . Lack of Transportation (Non-Medical): Not on file  Physical Activity:   . Days of Exercise per Week: Not on file  . Minutes of Exercise per Session: Not on file  Stress:   . Feeling of Stress : Not on file  Social Connections:   . Frequency of  Communication with Friends and Family: Not on file  . Frequency of Social Gatherings with Friends and Family: Not on file  . Attends Religious Services: Not on file  . Active Member of Clubs or Organizations: Not on file  . Attends Banker Meetings: Not on file  . Marital Status: Not on file   Additional Social History:    Allergies:  No Known Allergies  Labs:  Results for orders placed or performed during the hospital encounter of 11/02/19 (from the past 48  hour(s))  SARS Coronavirus 2 by RT PCR (hospital order, performed in [facility] hospital lab) Nasopharyngeal Nasopharyngeal Swab     Status: None   Collection Time: 11/02/19  5:15 PM   Specimen: Nasopharyngeal Swab  Result Value Ref Range   SARS Coronavirus 2 NEGATIVE NEGATIVE    Comment: (NOTE) SARS-CoV-2 target nucleic acids are NOT DETECTED.  The SARS-CoV-2 RNA is generally detectable in upper and lower respiratory specimens during the acute phase of infection. The lowest concentration of SARS-CoV-2 viral copies this assay can detect is 250 copies / mL. A negative result does not preclude SARS-CoV-2 infection and should not be used as the sole basis for treatment or other patient management decisions.  A negative result may occur with improper specimen collection / handling, submission of specimen other than nasopharyngeal swab, presence of viral mutation(s) within the areas targeted by this assay, and inadequate number of viral copies (<250 copies / mL). A negative result must be combined with clinical observations, patient history, and epidemiological information.  Fact Sheet for Patients:   BoilerBrush.com.cy  Fact Sheet for Healthcare Providers: https://pope.com/  This test is not yet approved or  cleared by the [place] FDA and has been authorized for detection and/or diagnosis of SARS-CoV-2 by FDA under an Emergency Use Authorization (EUA).  This EUA will remain in effect (meaning this test can be used) for the duration of the COVID-19 declaration under Section 564(b)(1) of the Act, 21 U.S.C. section 360bbb-3(b)(1), unless the authorization is terminated or revoked sooner.  Performed at [facility], [address] [place], [place] [address]   CBG monitoring, ED     Status: Abnormal   Collection Time: 11/02/19  5:16 PM  Result Value Ref Range   Glucose-Capillary >600 (HH) 70 - 99 mg/dL    Comment: Glucose  reference range applies only to samples taken after fasting for at least 8 hours.  Lactic acid, plasma     Status: None   Collection Time: 11/02/19  5:20 PM  Result Value Ref Range   Lactic Acid, Venous  0.5 - 1.9 mmol/L    SPECIMEN HEMOLYZED. HEMOLYSIS MAY AFFECT INTEGRITY OF RESULTS.    Comment: A.COLLIN,RN 11/02/2019 1824 DAVISB Performed at [facility], [address] [place], [place] [address]   CBC WITH DIFFERENTIAL     Status: Abnormal   Collection Time: 11/02/19  5:20 PM  Result Value Ref Range   WBC 27.4 (H) 4.0 - 10.5 K/uL   RBC 5.80 4.22 - 5.81 MIL/uL   Hemoglobin 16.9 13.0 - 17.0 g/dL   HCT 24.4 (H) 39 - 52 %   MCV 93.4 80.0 - 100.0 fL   MCH 29.1 26.0 - 34.0 pg   MCHC 31.2 30.0 - 36.0 g/dL   RDW 01.0 27.2 - 53.6 %   Platelets 371 150 - 400 K/uL   nRBC 0.0 0.0 - 0.2 %   Neutrophils Relative % 75 %   Neutro Abs  20.5 (H) 1.7 - 7.7 K/uL   Lymphocytes Relative 9 %   Lymphs Abs 2.4 0.7 - 4.0 K/uL   Monocytes Relative 12 %   Monocytes Absolute 3.3 (H) 0 - 1 K/uL   Eosinophils Relative 0 %   Eosinophils Absolute 0.0 0 - 0 K/uL   Basophils Relative 0 %   Basophils Absolute 0.1 0 - 0 K/uL   Immature Granulocytes 4 %   Abs Immature Granulocytes 1.13 (H) 0.00 - 0.07 K/uL    Comment: Performed at [facility], [address] [place], [place] [address]  I-Stat arterial blood gas, [facility])     Status: Abnormal   Collection Time: 11/02/19  5:40 PM  Result Value Ref Range   pH, Arterial 6.998 (LL) 7.35 - 7.45   pCO2 arterial <15.0 (LL) 32 - 48 mmHg   pO2, Arterial 132 (H) 83 - 108 mmHg   Bicarbonate 3.4 (L) 20.0 - 28.0 mmol/L   TCO2 <5 (L) 22 - 32 mmol/L   O2 Saturation 98.0 %   Acid-base deficit 28.0 (H) 0.0 - 2.0 mmol/L   Sodium 127 (L) 135 - 145 mmol/L   Potassium 5.4 (H) 3.5 - 5.1 mmol/L   Calcium, Ion 1.28 1.15 - 1.40 mmol/L   HCT 53.0 (H) 39 - 52 %   Hemoglobin 18.0 (H) 13.0 - 17.0 g/dL   Patient temperature 56.2 F    Sample type ARTERIAL    Comment  NOTIFIED PHYSICIAN   Lactic acid, plasma     Status: Abnormal   Collection Time: 11/02/19  7:25 PM  Result Value Ref Range   Lactic Acid, Venous 2.0 (HH) 0.5 - 1.9 mmol/L    Comment: CRITICAL RESULT CALLED TO, READ BACK BY AND VERIFIED WITH: RN A COLEMAN AT 2026 11/02/19 BY L BENFIELD Performed at [facility], [address] [place], [place] [address]   APTT     Status: None   Collection Time: 11/02/19  7:25 PM  Result Value Ref Range   aPTT 26 24 - 36 seconds    Comment: Performed at [facility], [address], [place], [place] [address]  Protime-INR     Status: Abnormal   Collection Time: 11/02/19  7:25 PM  Result Value Ref Range   Prothrombin Time 16.0 (H) 11.4 - 15.2 seconds   INR 1.3 (H) 0.8 - 1.2    Comment: (NOTE) INR goal varies based on device and disease states. Performed at [facility], [address], [place], [place] [address]   Comprehensive metabolic panel     Status: Abnormal   Collection Time: 11/02/19  7:25 PM  Result Value Ref Range   Sodium 130 (L) 135 - 145 mmol/L   Potassium 7.3 (HH) 3.5 - 5.1 mmol/L    Comment: HEMOLYSIS AT THIS LEVEL MAY AFFECT RESULT CRITICAL RESULT CALLED TO, READ BACK BY AND VERIFIED WITH: RN A COLEMAN AT 2034 11/02/19 BY L BENFIELD    Chloride 102 98 - 111 mmol/L   CO2 <7 (L) 22 - 32 mmol/L   Glucose, Bld 1,116 (HH) 70 - 99 mg/dL    Comment: Glucose reference range applies only to samples taken after fasting for at least 8 hours. CRITICAL RESULT CALLED TO, READ BACK BY AND VERIFIED WITH: RN A COLEMAN AT 2034 11/02/19 BY L BENFIELD    BUN 49 (H) 6 - 20 mg/dL   Creatinine, Ser 2.84 (H) 0.61 - 1.24 mg/dL   Calcium 8.3 (  L) 8.9 - 10.3 mg/dL   Total Protein 6.4 (L) 6.5 - 8.1 g/dL   Albumin 3.7 3.5 - 5.0 g/dL   AST 35 15 - 41 U/L   ALT 31 0 - 44 U/L   Alkaline Phosphatase 105 38 - 126 U/L   Total Bilirubin 2.9 (H) 0.3 - 1.2 mg/dL   GFR calc non Af Amer 21 (L) >60 mL/min   GFR calc Af Amer 25 (L) >60 mL/min    Anion gap NOT CALCULATED 5 - 15    Comment: Performed at [facility], [address], [place], [place] [address]  Beta-hydroxybutyric acid     Status: Abnormal   Collection Time: 11/02/19  7:25 PM  Result Value Ref Range   Beta-Hydroxybutyric Acid >8.00 (H) 0.05 - 0.27 mmol/L    Comment: RESULTS CONFIRMED BY MANUAL DILUTION Performed at [facility], [address], [place], [place] [address]   CBG monitoring, ED     Status: Abnormal   Collection Time: 11/02/19  7:35 PM  Result Value Ref Range   Glucose-Capillary >600 (HH) 70 - 99 mg/dL    Comment: Glucose reference range applies only to samples taken after fasting for at least 8 hours.  I-Stat Creatinine, ED (not at [facility])     Status: Abnormal   Collection Time: 11/02/19  8:00 PM  Result Value Ref Range   Creatinine, Ser 2.60 (H) 0.61 - 1.24 mg/dL  Urinalysis, Routine w reflex microscopic     Status: Abnormal   Collection Time: 11/02/19  8:30 PM  Result Value Ref Range   Color, Urine YELLOW YELLOW   APPearance HAZY (A) CLEAR   Specific Gravity, Urine 1.016 1.005 - 1.030   pH 5.0 5.0 - 8.0   Glucose, UA >=500 (A) NEGATIVE mg/dL   Hgb urine dipstick MODERATE (A) NEGATIVE   Bilirubin Urine NEGATIVE NEGATIVE   Ketones, ur 20 (A) NEGATIVE mg/dL   Protein, ur 30 (A) NEGATIVE mg/dL   Nitrite NEGATIVE NEGATIVE   Leukocytes,Ua NEGATIVE NEGATIVE   RBC / HPF 0-5 0 - 5 RBC/hpf   WBC, UA 0-5 0 - 5 WBC/hpf   Bacteria, UA RARE (A) NONE SEEN   Mucus PRESENT     Comment: Performed at [facility], [address] [place], [place] [address]  CBG monitoring, ED     Status: Abnormal   Collection Time: 11/02/19  8:38 PM  Result Value Ref Range   Glucose-Capillary >600 (HH) 70 - 99 mg/dL    Comment: Glucose reference range applies only to samples taken after fasting for at least 8 hours.  CBG monitoring, ED     Status: Abnormal   Collection Time: 11/02/19  9:23 PM  Result Value Ref Range   Glucose-Capillary >600 (HH) 70 - 99  mg/dL    Comment: Glucose reference range applies only to samples taken after fasting for at least 8 hours.  CBG monitoring, ED     Status: Abnormal   Collection Time: 11/02/19 10:06 PM  Result Value Ref Range   Glucose-Capillary >600 (HH) 70 - 99 mg/dL    Comment: Glucose reference range applies only to samples taken after fasting for at least 8 hours.  CBC     Status: Abnormal   Collection Time: 11/02/19 10:46 PM  Result Value Ref Range   WBC 24.3 (H) 4.0 - 10.5 K/uL   RBC 5.79 4.22 - 5.81 MIL/uL   Hemoglobin 16.6 13.0 - 17.0 g/dL    Comment:  DELTA CHECK NOTED   HCT 50.5 39 - 52 %   MCV 87.2 80.0 - 100.0 fL   MCH 28.7 26.0 - 34.0 pg   MCHC 32.9 30.0 - 36.0 g/dL   RDW 45.4 09.8 - 11.9 %   Platelets 263 150 - 400 K/uL   nRBC 0.0 0.0 - 0.2 %    Comment: Performed at [facility], [address] [place], [place] [address]  Lactic acid, plasma     Status: None   Collection Time: 11/02/19 10:46 PM  Result Value Ref Range   Lactic Acid, Venous 1.9 0.5 - 1.9 mmol/L    Comment: Performed at [facility], [address] [place], [place] [address]  Amylase     Status: Abnormal   Collection Time: 11/02/19 10:46 PM  Result Value Ref Range   Amylase 462 (H) 28 - 100 U/L    Comment: Performed at [facility], [address], [place], [place] [address]  Basic metabolic panel     Status: Abnormal   Collection Time: 11/02/19 10:46 PM  Result Value Ref Range   Sodium 139 135 - 145 mmol/L   Potassium 4.6 3.5 - 5.1 mmol/L   Chloride 110 98 - 111 mmol/L   CO2 <7 (L) 22 - 32 mmol/L   Glucose, Bld 698 (HH) 70 - 99 mg/dL    Comment: Glucose reference range applies only to samples taken after fasting for at least 8 hours. CRITICAL RESULT CALLED TO, READ BACK BY AND VERIFIED WITH: [facility] RN [phone] M GARRETT    BUN 52 (H) 6 - 20 mg/dL   Creatinine, Ser 2.95 (H) 0.61 - 1.24 mg/dL   Calcium 8.7 (L) 8.9 - 10.3 mg/dL   GFR calc non Af Amer 21 (L) >60 mL/min   GFR  calc Af Amer 25 (L) >60 mL/min   Anion gap NOT CALCULATED 5 - 15    Comment: Performed at [facility], [address], [place], [place] [address]  Lipase, blood     Status: Abnormal   Collection Time: 11/02/19 10:46 PM  Result Value Ref Range   Lipase 991 (H) 11 - 51 U/L    Comment: RESULTS CONFIRMED BY MANUAL DILUTION Performed at [facility], [address], [place], [place] [address]   CBG monitoring, ED     Status: Abnormal   Collection Time: 11/02/19 10:51 PM  Result Value Ref Range   Glucose-Capillary 578 (HH) 70 - 99 mg/dL    Comment: Glucose reference range applies only to samples taken after fasting for at least 8 hours.  HIV Antibody (routine testing w rflx)     Status: None   Collection Time: 11/02/19 11:10 PM  Result Value Ref Range   HIV Screen 4th Generation wRfx Non Reactive Non Reactive    Comment: Performed at [facility], [address], [place], [place] [address]  Glucose, capillary     Status: Abnormal   Collection Time: 11/02/19 11:41 PM  Result Value Ref Range   Glucose-Capillary 591 (HH) 70 - 99 mg/dL    Comment: Glucose reference range applies only to samples taken after fasting for at least 8 hours.  Glucose, capillary     Status: Abnormal   Collection Time: 11/02/19 11:59 PM  Result Value Ref Range   Glucose-Capillary >600 (HH) 70 - 99 mg/dL    Comment: Glucose reference range applies only to samples taken after fasting for at  least 8 hours.  Glucose, capillary     Status: Abnormal   Collection Time: 11/03/19 12:57 AM  Result Value Ref Range   Glucose-Capillary 494 (H) 70 - 99 mg/dL    Comment: Glucose reference range applies only to samples taken after fasting for at least 8 hours.   Comment 1 Notify RN   Glucose, capillary     Status: Abnormal   Collection Time: 11/03/19  1:26 AM  Result Value Ref Range   Glucose-Capillary 401 (H) 70 - 99 mg/dL    Comment: Glucose reference range applies only to samples taken after fasting for  at least 8 hours.  Osmolality     Status: Abnormal   Collection Time: 11/03/19  1:27 AM  Result Value Ref Range   Osmolality 354 (HH) 275 - 295 mOsm/kg    Comment: REPEATED TO VERIFY CRITICAL RESULT CALLED TO, READ BACK BY AND VERIFIED WITH: RN TBEDT DALLAS AT 0257 BY MESSAN HOUEGNIFIO ON 11/03/2019 Performed at [facility], [address] [place], [place] [address]   Beta-hydroxybutyric acid     Status: Abnormal   Collection Time: 11/03/19  1:27 AM  Result Value Ref Range   Beta-Hydroxybutyric Acid >8.00 (H) 0.05 - 0.27 mmol/L    Comment: RESULTS CONFIRMED BY MANUAL DILUTION Performed at [facility], [address], [place], [place] [address]   Basic metabolic panel     Status: Abnormal   Collection Time: 11/03/19  1:27 AM  Result Value Ref Range   Sodium 141 135 - 145 mmol/L   Potassium 4.4 3.5 - 5.1 mmol/L   Chloride 109 98 - 111 mmol/L   CO2 <7 (L) 22 - 32 mmol/L   Glucose, Bld 460 (H) 70 - 99 mg/dL    Comment: Glucose reference range applies only to samples taken after fasting for at least 8 hours.   BUN 51 (H) 6 - 20 mg/dL   Creatinine, Ser 3.57 (H) 0.61 - 1.24 mg/dL   Calcium 9.3 8.9 - 01.7 mg/dL   GFR calc non Af Amer 25 (L) >60 mL/min   GFR calc Af Amer 28 (L) >60 mL/min   Anion gap NOT CALCULATED 5 - 15    Comment: Performed at [facility], [address], [place], [place] [address]  Glucose, capillary     Status: Abnormal   Collection Time: 11/03/19  1:58 AM  Result Value Ref Range   Glucose-Capillary 455 (H) 70 - 99 mg/dL    Comment: Glucose reference range applies only to samples taken after fasting for at least 8 hours.   Comment 1 Notify RN   MRSA PCR Screening     Status: None   Collection Time: 11/03/19  2:04 AM   Specimen: Nasopharyngeal  Result Value Ref Range   MRSA by PCR NEGATIVE NEGATIVE    Comment:        The GeneXpert MRSA Assay (FDA approved for NASAL specimens only), is one component of a comprehensive MRSA  colonization surveillance program. It is not intended to diagnose MRSA infection nor to guide or monitor treatment for MRSA infections. Performed at [facility], [address] [place], [place] [address]   Glucose, capillary     Status: Abnormal   Collection Time: 11/03/19  2:47 AM  Result Value Ref Range   Glucose-Capillary 350 (H) 70 - 99 mg/dL    Comment: Glucose reference range applies only to samples taken after fasting for at least 8 hours.  Glucose, capillary     Status: Abnormal   Collection Time: 11/03/19  3:45 AM  Result Value Ref Range   Glucose-Capillary 248 (H) 70 - 99 mg/dL    Comment: Glucose reference range applies only to samples taken after fasting for at least 8 hours.   Comment 1 Notify RN   Glucose, capillary     Status: Abnormal   Collection Time: 11/03/19  4:41 AM  Result Value Ref Range   Glucose-Capillary 255 (H) 70 - 99 mg/dL    Comment: Glucose reference range applies only to samples taken after fasting for at least 8 hours.   Comment 1 Notify RN   Glucose, capillary     Status: Abnormal   Collection Time: 11/03/19  5:09 AM  Result Value Ref Range   Glucose-Capillary 234 (H) 70 - 99 mg/dL    Comment: Glucose reference range applies only to samples taken after fasting for at least 8 hours.  Glucose, capillary     Status: Abnormal   Collection Time: 11/03/19  5:43 AM  Result Value Ref Range   Glucose-Capillary 238 (H) 70 - 99 mg/dL    Comment: Glucose reference range applies only to samples taken after fasting for at least 8 hours.  Glucose, capillary     Status: Abnormal   Collection Time: 11/03/19  6:33 AM  Result Value Ref Range   Glucose-Capillary 259 (H) 70 - 99 mg/dL    Comment: Glucose reference range applies only to samples taken after fasting for at least 8 hours.  Glucose, capillary     Status: Abnormal   Collection Time: 11/03/19  7:47 AM  Result Value Ref Range   Glucose-Capillary 231 (H) 70 - 99 mg/dL    Comment: Glucose  reference range applies only to samples taken after fasting for at least 8 hours.  Glucose, capillary     Status: Abnormal   Collection Time: 11/03/19  8:38 AM  Result Value Ref Range   Glucose-Capillary 243 (H) 70 - 99 mg/dL    Comment: Glucose reference range applies only to samples taken after fasting for at least 8 hours.  Glucose, capillary     Status: Abnormal   Collection Time: 11/03/19 10:13 AM  Result Value Ref Range   Glucose-Capillary 194 (H) 70 - 99 mg/dL    Comment: Glucose reference range applies only to samples taken after fasting for at least 8 hours.  Glucose, capillary     Status: Abnormal   Collection Time: 11/03/19 11:22 AM  Result Value Ref Range   Glucose-Capillary 209 (H) 70 - 99 mg/dL    Comment: Glucose reference range applies only to samples taken after fasting for at least 8 hours.  Glucose, capillary     Status: Abnormal   Collection Time: 11/03/19 12:21 PM  Result Value Ref Range   Glucose-Capillary 227 (H) 70 - 99 mg/dL    Comment: Glucose reference range applies only to samples taken after fasting for at least 8 hours.  Glucose, capillary     Status: Abnormal   Collection Time: 11/03/19  1:41 PM  Result Value Ref Range   Glucose-Capillary 261 (H) 70 - 99 mg/dL    Comment: Glucose reference range applies only to samples taken after fasting for at least 8 hours.    Current Facility-Administered Medications  Medication Dose Route Frequency Provider Last Rate Last Admin  . Chlorhexidine Gluconate Cloth 2 % PADS 6 each  6 each Topical Daily Kirtland Bouchard, MD      .  dextrose 5 % in lactated ringers infusion   Intravenous Continuous Gleason, Darcella Gasman, PA-C 125 mL/hr at 11/03/19 0600 Rate Verify at 11/03/19 0600  . dextrose 50 % solution 0-50 mL  0-50 mL Intravenous PRN Gleason, Darcella Gasman, PA-C      . docusate sodium (COLACE) capsule 100 mg  100 mg Oral BID PRN Gleason, Darcella Gasman, PA-C      . heparin injection 5,000 Units  5,000 Units Subcutaneous Q8H  Gleason, Darcella Gasman, PA-C   5,000 Units at 11/03/19 0750  . insulin regular, human (MYXREDLIN) 100 units/ 100 mL infusion   Intravenous Continuous Gleason, Darcella Gasman, PA-C 5 mL/hr at 11/03/19 0600 Rate Verify at 11/03/19 0600  . lactated ringers infusion   Intravenous Continuous Gleason, Darcella Gasman, PA-C 125 mL/hr at 11/03/19 0300 Rate Verify at 11/03/19 0300  . polyethylene glycol (MIRALAX / GLYCOLAX) packet 17 g  17 g Oral Daily PRN Gleason, Darcella Gasman, PA-C      . risperiDONE (RISPERDAL) tablet 2 mg  2 mg Oral QHS Lorin Glass, MD        Musculoskeletal: Strength & Muscle Tone: not tested Gait & Station: not tested Patient leans: N/A  Psychiatric Specialty Exam: Physical Exam Psychiatric:        Attention and Perception: Attention and perception normal.        Mood and Affect: Mood normal. Affect is flat.        Speech: Speech normal.        Behavior: Behavior normal. Behavior is cooperative.        Thought Content: Thought content normal.        Cognition and Memory: Cognition and memory normal.        Judgment: Judgment normal.     Review of Systems  Constitutional: Positive for fatigue.  HENT: Negative.   Eyes: Negative.   Cardiovascular: Negative.   Neurological: Negative.   Psychiatric/Behavioral: Negative.     Blood pressure (!) 156/97, pulse (!) 103, temperature 99.7 F (37.6 C), resp. rate 17, weight 98.1 kg, SpO2 100 %.Body mass index is 25.65 kg/m.  General Appearance: Casual  Eye Contact:  Good  Speech:  Clear and Coherent and Slow  Volume:  Decreased  Mood:  Dysphoric  Affect:  Congruent  Thought Process:  Coherent  Orientation:  Full (Time, Place, and Person)  Thought Content:  Logical  Suicidal Thoughts:  No  Homicidal Thoughts:  No  Memory:  Immediate;   Fair Recent;   Fair Remote;   Fair  Judgement:  Intact  Insight:  Fair  Psychomotor Activity:  Psychomotor Retardation  Concentration:  Concentration: Good and Attention Span: Good  Recall:  Good  Fund  of Knowledge:  Fair  Language:  Good  Akathisia:  No  Handed:  Right  AIMS (if indicated):     Assets:  Communication Skills Desire for Improvement Social Support  ADL's:  Intact  Cognition:  WNL  Sleep:        Treatment Plan Summary: 39 year old male with long history of Schizophrenia controlled by Olanzapine. However, patient was admitted to the ICU due to elevated blood sugar most likely induced by Olanzapine. Psychiatric consultation was initiated to seek a substitute anti-psychotic medication.  Recommendations: -Consider Haldol 5 mg at bedtime for schizophrenia-typical anti-psychotic, less likely to cause elevated blood sugar. Atypical anti-psychotics such as Olanzapine, Risperidone, Seroquel, Invega have stronger association with elevated blood sugar. -Discontinue Olanzapine and Risperidone. -Consider social worker consult to facilitate patient's referral  for outpatient psychiatric follow up appointment after patient is medically stable.  Disposition: No evidence of imminent risk to self or others at present.   Supportive therapy provided about ongoing stressors. Psychiatric service signing off. Re-consult as needed  Thedore Mins, MD 11/03/2019 1:47 PM

## 2019-11-03 NOTE — Progress Notes (Signed)
eLink Physician-Brief Progress Note Patient Name: SYMEON PULEO DOB: 1980-04-16 MRN: [id number]   Date of Service  11/03/2019  HPI/Events of Note  Hypokalemia - K+ = 2.9 and Creatinine = 2.88.   eICU Interventions  Will replace K+.      Intervention Category Major Interventions: Electrolyte abnormality - evaluation and management  Zuriel Yeaman Eugene 11/03/2019, 11:42 PM

## 2019-11-03 NOTE — Progress Notes (Signed)
   NAME:  Bob Reyes, MRN:  [id number], DOB:  1980-09-28, LOS: 1 ADMISSION DATE:  11/02/2019,   CHIEF COMPLAINT:  ALOC   Brief History   This is a 39y/o male that presented to the ER with ALOC  History of present illness   39y/o black male that presented to the ER from home.  The pt lives alone but his mother checks on him several times a day.  He has a hx of schizophrenia.  The pts mother found the pt sitting on the floor and was mumbling incoherently.    Once in the ER BS >600 ph 6.99   Recent symptoms: polydipsia and polyuria  + nausea,  Pt is not sure when it started. + anorexia and possible diarrhea  No fevers/weightloss/ sore throat/abd pain/chills/cough/dysuria   Schizophrenia: hx:  He has had multiple psychotic breaks in the past.  Broken both ankles jumping out of second story window.  Has been compliant and stabilized since he started on zyprexa  Past Medical History  Schizophrenia  HTN  Procedures:  none  Significant Diagnostic Tests:  none  Micro Data:  COVID negative    Objective   Blood pressure (!) 147/97, pulse (!) 110, temperature 99.1 F (37.3 C), resp. rate 19, SpO2 100 %.        Intake/Output Summary (Last 24 hours) at 11/03/2019 0704 Last data filed at 11/03/2019 0500 Gross per 24 hour  Intake 557.78 ml  Output 275 ml  Net 282.78 ml   There were no vitals filed for this visit.  Examination: Constitutional: middle age man in no acute distress Eyes: eyes are anicteric, reactive to light Ears, nose, mouth, and throat: mucous membranes dry, trachea midline Cardiovascular: heart sounds are tachycardic, regular, ext are warm to touch. no edema Respiratory: clear to auscultation, no accessory muscle use Gastrointestinal: abdomen is soft with + BS Skin: No rashes, normal turgor Neurologic: moves all 4 ext to command Psychiatric: flat affect, AO x 3  Sluggish UoP CBC stable WBC/HgB/Plts okay  Assessment & Plan:  New diagnosis DM with DKA, acute  pancreatitis- probably related to zyprexa - Usual insulin gtt, BMP trending - Transition once gap closes - Clear liquid diet trial - Generous with IVF  Paranoid schizophrenia - Switch patient to risperidone which is not associated with DKA and pancreatitis; will ask psychiatry to come on and see if they agree with this change  Acute renal failure- related to DKA, metabolic derangements - Continue IVF, strict I/o, avoid nephrotoxins   Best practice:  Diet: clear liquid diet Pain/Anxiety/Delirium protocol (if indicated):  VAP protocol (if indicated): n/a DVT prophylaxis: Heparin GI prophylaxis:  Glucose control: DKA endotool Mobility: OOB  Code Status: Full  Family Communication: updated patient Disposition: ICU pending gap closure and ability to tolerate PO  The patient is critically ill with multiple organ systems failure and requires high complexity decision making for assessment and support, frequent evaluation and titration of therapies, application of advanced monitoring technologies and extensive interpretation of multiple databases. Critical Care Time devoted to patient care services described in this note independent of APP/resident time (if applicable)  is 34 minutes.   Myrla Halsted MD  Pulmonary Critical Care 11/03/2019 7:16 AM Personal pager: [phone] If unanswered, please page CCM On-call: #[phone]

## 2019-11-04 DIAGNOSIS — F200 Paranoid schizophrenia: Secondary | ICD-10-CM

## 2019-11-04 LAB — BASIC METABOLIC PANEL
Anion gap: 11 (ref 5–15)
Anion gap: 11 (ref 5–15)
Anion gap: 14 (ref 5–15)
BUN: 48 mg/dL — ABNORMAL HIGH (ref 6–20)
BUN: 44 mg/dL — ABNORMAL HIGH (ref 6–20)
BUN: 39 mg/dL — ABNORMAL HIGH (ref 6–20)
CO2: 16 mmol/L — ABNORMAL LOW (ref 22–32)
CO2: 15 mmol/L — ABNORMAL LOW (ref 22–32)
CO2: 16 mmol/L — ABNORMAL LOW (ref 22–32)
Calcium: 9.6 mg/dL (ref 8.9–10.3)
Calcium: 9.1 mg/dL (ref 8.9–10.3)
Calcium: 9.5 mg/dL (ref 8.9–10.3)
Chloride: 118 mmol/L — ABNORMAL HIGH (ref 98–111)
Chloride: 119 mmol/L — ABNORMAL HIGH (ref 98–111)
Chloride: 110 mmol/L (ref 98–111)
Creatinine, Ser: 2.82 mg/dL — ABNORMAL HIGH (ref 0.61–1.24)
Creatinine, Ser: 2.65 mg/dL — ABNORMAL HIGH (ref 0.61–1.24)
Creatinine, Ser: 2.63 mg/dL — ABNORMAL HIGH (ref 0.61–1.24)
GFR calc Af Amer: 31 mL/min — ABNORMAL LOW (ref >60)
GFR calc Af Amer: 33 mL/min — ABNORMAL LOW (ref >60)
GFR calc Af Amer: 34 mL/min — ABNORMAL LOW (ref >60)
GFR calc non Af Amer: 27 mL/min — ABNORMAL LOW (ref >60)
GFR calc non Af Amer: 29 mL/min — ABNORMAL LOW (ref >60)
GFR calc non Af Amer: 29 mL/min — ABNORMAL LOW (ref >60)
Glucose, Bld: 220 mg/dL — ABNORMAL HIGH (ref 70–99)
Glucose, Bld: 237 mg/dL — ABNORMAL HIGH (ref 70–99)
Glucose, Bld: 466 mg/dL — ABNORMAL HIGH (ref 70–99)
Potassium: 3.1 mmol/L — ABNORMAL LOW (ref 3.5–5.1)
Potassium: 2.8 mmol/L — ABNORMAL LOW (ref 3.5–5.1)
Potassium: 3.5 mmol/L (ref 3.5–5.1)
Sodium: 145 mmol/L (ref 135–145)
Sodium: 145 mmol/L (ref 135–145)
Sodium: 140 mmol/L (ref 135–145)

## 2019-11-04 LAB — CBC
HCT: 37.8 % — ABNORMAL LOW (ref 39.0–52.0)
Hemoglobin: 13.5 g/dL (ref 13.0–17.0)
MCH: 29.3 pg (ref 26.0–34.0)
MCHC: 35.7 g/dL (ref 30.0–36.0)
MCV: 82.2 fL (ref 80.0–100.0)
Platelets: 181 10*3/uL (ref 150–400)
RBC: 4.6 MIL/uL (ref 4.22–5.81)
RDW: 13.4 % (ref 11.5–15.5)
WBC: 12.6 10*3/uL — ABNORMAL HIGH (ref 4.0–10.5)
nRBC: 0 % (ref 0.0–0.2)

## 2019-11-04 LAB — URINE CULTURE: Culture: NO GROWTH

## 2019-11-04 LAB — GLUCOSE, CAPILLARY
Glucose-Capillary: 125 mg/dL — ABNORMAL HIGH (ref 70–99)
Glucose-Capillary: 163 mg/dL — ABNORMAL HIGH (ref 70–99)
Glucose-Capillary: 175 mg/dL — ABNORMAL HIGH (ref 70–99)
Glucose-Capillary: 178 mg/dL — ABNORMAL HIGH (ref 70–99)
Glucose-Capillary: 192 mg/dL — ABNORMAL HIGH (ref 70–99)
Glucose-Capillary: 199 mg/dL — ABNORMAL HIGH (ref 70–99)
Glucose-Capillary: 202 mg/dL — ABNORMAL HIGH (ref 70–99)
Glucose-Capillary: 210 mg/dL — ABNORMAL HIGH (ref 70–99)
Glucose-Capillary: 212 mg/dL — ABNORMAL HIGH (ref 70–99)
Glucose-Capillary: 216 mg/dL — ABNORMAL HIGH (ref 70–99)
Glucose-Capillary: 216 mg/dL — ABNORMAL HIGH (ref 70–99)
Glucose-Capillary: 219 mg/dL — ABNORMAL HIGH (ref 70–99)
Glucose-Capillary: 219 mg/dL — ABNORMAL HIGH (ref 70–99)
Glucose-Capillary: 220 mg/dL — ABNORMAL HIGH (ref 70–99)
Glucose-Capillary: 234 mg/dL — ABNORMAL HIGH (ref 70–99)
Glucose-Capillary: 326 mg/dL — ABNORMAL HIGH (ref 70–99)
Glucose-Capillary: 426 mg/dL — ABNORMAL HIGH (ref 70–99)
Glucose-Capillary: 472 mg/dL — ABNORMAL HIGH (ref 70–99)
Glucose-Capillary: 478 mg/dL — ABNORMAL HIGH (ref 70–99)
Glucose-Capillary: 488 mg/dL — ABNORMAL HIGH (ref 70–99)
Glucose-Capillary: 491 mg/dL — ABNORMAL HIGH (ref 70–99)

## 2019-11-04 LAB — BETA-HYDROXYBUTYRIC ACID: Beta-Hydroxybutyric Acid: 2.57 mmol/L — ABNORMAL HIGH (ref 0.05–0.27)

## 2019-11-04 MED ORDER — INSULIN GLARGINE 100 UNIT/ML ~~LOC~~ SOLN
30.0000 [IU] | Freq: Every day | SUBCUTANEOUS | Status: DC
  Administered 2019-11-04: 30 [IU] via SUBCUTANEOUS
  Filled 2019-11-04: qty 0.3

## 2019-11-04 MED ORDER — LIVING WELL WITH DIABETES BOOK
Freq: Once | Status: AC
  Filled 2019-11-04: qty 1

## 2019-11-04 MED ORDER — INSULIN REGULAR(HUMAN) IN NACL 100-0.9 UT/100ML-% IV SOLN
INTRAVENOUS | Status: DC
  Administered 2019-11-04: 17 [IU]/h via INTRAVENOUS
  Administered 2019-11-05: 11 [IU]/h via INTRAVENOUS
  Filled 2019-11-04 (×3): qty 100

## 2019-11-04 MED ORDER — POTASSIUM CHLORIDE 10 MEQ/100ML IV SOLN
10.0000 meq | INTRAVENOUS | Status: AC
  Administered 2019-11-04 (×6): 10 meq via INTRAVENOUS
  Filled 2019-11-04 (×5): qty 100

## 2019-11-04 MED ORDER — INSULIN REGULAR(HUMAN) IN NACL 100-0.9 UT/100ML-% IV SOLN
INTRAVENOUS | Status: DC
  Filled 2019-11-04: qty 100

## 2019-11-04 MED ORDER — INSULIN ASPART 100 UNIT/ML ~~LOC~~ SOLN: 0.0000 [IU] | Freq: Every day | SUBCUTANEOUS | Status: DC

## 2019-11-04 MED ORDER — INSULIN ASPART 100 UNIT/ML ~~LOC~~ SOLN
15.0000 [IU] | Freq: Once | SUBCUTANEOUS | Status: AC
  Administered 2019-11-04: 15 [IU] via SUBCUTANEOUS

## 2019-11-04 MED ORDER — DEXTROSE-NACL 5-0.9 % IV SOLN: INTRAVENOUS | Status: DC

## 2019-11-04 MED ORDER — POTASSIUM CHLORIDE 10 MEQ/100ML IV SOLN
INTRAVENOUS | Status: AC
  Filled 2019-11-04: qty 100

## 2019-11-04 MED ORDER — AMLODIPINE BESYLATE 5 MG PO TABS: 5.0000 mg | ORAL_TABLET | Freq: Every day | ORAL | Status: DC

## 2019-11-04 MED ORDER — INSULIN ASPART 100 UNIT/ML ~~LOC~~ SOLN
6.0000 [IU] | Freq: Three times a day (TID) | SUBCUTANEOUS | Status: DC
  Administered 2019-11-04: 6 [IU] via SUBCUTANEOUS

## 2019-11-04 MED ORDER — SODIUM CHLORIDE 0.9 % IV BOLUS
1000.0000 mL | Freq: Once | INTRAVENOUS | Status: AC
  Administered 2019-11-04: 1000 mL via INTRAVENOUS

## 2019-11-04 MED ORDER — HYDROCHLOROTHIAZIDE 25 MG PO TABS
25.0000 mg | ORAL_TABLET | Freq: Every day | ORAL | Status: DC
  Administered 2019-11-04 – 2019-11-05 (×2): 25 mg via ORAL
  Filled 2019-11-04 (×2): qty 1

## 2019-11-04 MED ORDER — INSULIN ASPART 100 UNIT/ML ~~LOC~~ SOLN: 0.0000 [IU] | Freq: Three times a day (TID) | SUBCUTANEOUS | Status: DC

## 2019-11-04 MED ORDER — DEXTROSE 50 % IV SOLN: 0.0000 mL | INTRAVENOUS | Status: DC | PRN

## 2019-11-04 NOTE — Progress Notes (Signed)
[facility] PCCM  Blood sugar back up significantly > 400 Give 1 L NS now 15 U IV insulin now  Heber [place], MD [place] [facility] Pager: [phone] Cell: [phone] If no response, call [phone]

## 2019-11-04 NOTE — Progress Notes (Signed)
NAME:  Bob Reyes, MRN:  [id number], DOB:  May 23, 1980, LOS: 2 ADMISSION DATE:  11/02/2019, CONSULTATION DATE:  9/18 REFERRING MD:  Nicole Cella, CHIEF COMPLAINT:  Found down   Brief History   39 y/o male found down on 9/18, brought to the ED.  Blood sugar significantly elevated, admitted to ICU in setting of DKA.  His Hgb a1c is 11, no prior history of DM2. He had an elevated lipase on admission.   Past Medical History  Schizophrenia  HTN  Significant Hospital Events   9/18 admission  Consults:  Psychiatry   Procedures:    Significant Diagnostic Tests:    Micro Data:  9/18 SARS COV 2 > negative  Antimicrobials:    Interim history/subjective:  Feels better  Objective   Blood pressure (!) 163/86, pulse 84, temperature 99.1 F (37.3 C), resp. rate 15, weight 99.2 kg, SpO2 100 %.        Intake/Output Summary (Last 24 hours) at 11/04/2019 0803 Last data filed at 11/04/2019 0700 Gross per 24 hour  Intake 3617.65 ml  Output 1875 ml  Net 1742.65 ml   Filed Weights   11/03/19 0000 11/04/19 0500  Weight: 98.1 kg 99.2 kg    Examination:  General:  Resting comfortably in bed HENT: NCAT OP clear PULM: CTA B, normal effort CV: RRR, no mgr GI: BS+, soft, nontender MSK: normal bulk and tone Neuro: awake, alert, no distress, MAEW   Resolved Hospital Problem list     Assessment & Plan:  Hypertension: Restart HCTZ  AKI: improving Hypokalemia Monitor BMET and UOP Replace electrolytes as needed  DKA > resolved Change to sub cutaneous insulin today Stop IVF  Diabetes coordinator PCP follow up  Pancreatitis> uncertain, doubt, suspect elevated lipase is DKA related Advance diet  Schizophrenia, concern that olanzapine contributed to hyperglycemia Changing to haldol per psyche recommendations   Best practice:   Advance diet Transfer to [facility]  Labs   CBC: Recent Labs  Lab 11/02/19 1720 11/02/19 1740 11/02/19 2246 11/04/19 0548  WBC 27.4*  --  24.3*  12.6*  NEUTROABS 20.5*  --   --   --   HGB 16.9 18.0* 16.6 13.5  HCT 54.2* 53.0* 50.5 37.8*  MCV 93.4  --  87.2 82.2  PLT 371  --  263 181    Basic Metabolic Panel: Recent Labs  Lab 11/03/19 1649 11/03/19 1813 11/03/19 2253 11/04/19 0241 11/04/19 0548  NA 147* 146* 145 145 145  K 3.2* 3.3* 2.9* 3.1* 2.8*  CL 120* 119* 118* 118* 119*  CO2 12* 15* 15* 16* 15*  GLUCOSE 250* 241* 244* 220* 237*  BUN 50* 51* 49* 48* 44*  CREATININE 2.90* 3.02* 2.88* 2.82* 2.65*  CALCIUM 9.1 9.7 9.4 9.6 9.1   GFR: CrCl cannot be calculated (Unknown ideal weight.). Recent Labs  Lab 11/02/19 1720 11/02/19 1925 11/02/19 2246 11/04/19 0548  WBC 27.4*  --  24.3* 12.6*  LATICACIDVEN SPECIMEN HEMOLYZED. HEMOLYSIS MAY AFFECT INTEGRITY OF RESULTS. 2.0* 1.9  --     Liver Function Tests: Recent Labs  Lab 11/02/19 1925  AST 35  ALT 31  ALKPHOS 105  BILITOT 2.9*  PROT 6.4*  ALBUMIN 3.7   Recent Labs  Lab 11/02/19 2246  LIPASE 991*  AMYLASE 462*   No results for input(s): AMMONIA in the last 168 hours.  ABG    Component Value Date/Time   PHART 6.998 (LL) 11/02/2019 1740   PCO2ART <15.0 (LL) 11/02/2019 1740   PO2ART 132 (H)  11/02/2019 1740   HCO3 3.4 (L) 11/02/2019 1740   TCO2 <5 (L) 11/02/2019 1740   ACIDBASEDEF 28.0 (H) 11/02/2019 1740   O2SAT 98.0 11/02/2019 1740     Coagulation Profile: Recent Labs  Lab 11/02/19 1925  INR 1.3*    Cardiac Enzymes: No results for input(s): CKTOTAL, CKMB, CKMBINDEX, TROPONINI in the last 168 hours.  HbA1C: Hgb A1c MFr Bld  Date/Time Value Ref Range Status  11/03/2019 04:41 PM 11.7 (H) 4.8 - 5.6 % Final    Comment:    (NOTE) Pre diabetes:          5.7%-6.4%  Diabetes:              >6.4%  Glycemic control for   <7.0% adults with diabetes     CBG: Recent Labs  Lab 11/04/19 0346 11/04/19 0443 11/04/19 0551 11/04/19 0642 11/04/19 0753  GLUCAP 202* 212* 234* 216* 163*     Critical care time: n/a    Heber ,  MD [place] [facility] Pager: [phone] Cell: [phone] If no response, call [phone]

## 2019-11-04 NOTE — Progress Notes (Signed)
eLink Physician-Brief Progress Note Patient Name: Bob Reyes DOB: Oct 09, 1980 MRN: [id number]   Date of Service  11/04/2019  HPI/Events of Note  Hypokalemia - K+ = 2.8 and Creatinine = 2.65.   eICU Interventions  Will replace K+.      Intervention Category Major Interventions: Electrolyte abnormality - evaluation and management  Mahaila Tischer Eugene 11/04/2019, 6:41 AM

## 2019-11-04 NOTE — Progress Notes (Addendum)
Inpatient Diabetes Program Recommendations  AACE/ADA: New Consensus Statement on Inpatient Glycemic Control (2015)  Target Ranges:  Prepandial:   less than 140 mg/dL      Peak postprandial:   less than 180 mg/dL (1-2 hours)      Critically ill patients:  140 - 180 mg/dL   Lab Results  Component Value Date   GLUCAP 426 (H) 11/04/2019   HGBA1C 11.7 (H) 11/03/2019    Review of Glycemic Control Results for Bob Reyes, Bob Reyes (MRN [id number]) as of 11/04/2019 15:46  Ref. Range 11/04/2019 09:05 11/04/2019 10:13 11/04/2019 11:21 11/04/2019 15:35  Glucose-Capillary Latest Ref Range: 70 - 99 mg/dL 998 (H) 338 (H) 250 (H) 426 (H)   Diabetes history: New diagnosis of DM  Outpatient Diabetes medications:  None Current orders for Inpatient glycemic control:  Novolog moderate tid with meals and HS Lantus 30 units daily  Inpatient Diabetes Program Recommendations:    Note that patient has new diagnosis of DM.  A1C elevated with average blood sugar of 289 mg/dL. Patient lives alone however has support from his mother.  Spoke to patient and he states that he will be going home with his mother.  Will need to discuss with patients mother on 9/21 to make sure that patient has sustainable plan for DM and blood sugar management.   Discussed with patient his need to reduce his intake of sweetened beverages.  He states that he likes to drink milk.    Please add Novolog meal coverage 6 units tid with meals (hold if patient eats less than 50% or NPO).   Thanks,  Beryl Meager, RN, BC-ADM Inpatient Diabetes Coordinator Pager [phone] (8a-5p)

## 2019-11-04 NOTE — Progress Notes (Signed)
eLink Physician-Brief Progress Note Patient Name: Bob Reyes DOB: 1981/01/03 MRN: [id number]   Date of Service  11/04/2019  HPI/Events of Note  Patient on Insulin IV infusion for DKA . Gap not yet closed. Blood glucose = 199.  eICU Interventions  Plan: 1.D5 0.9 NaCl to run IV at 125 mL/hour.      Intervention Category Major Interventions: Hyperglycemia - active titration of insulin therapy  Delano Scardino Eugene 11/04/2019, 11:05 PM

## 2019-11-04 NOTE — Progress Notes (Signed)
[facility] PCCM  Worsening hyperglycemia Restart insulin infusion  Heber [place], MD [place] [facility] Pager: [phone] Cell: [phone] If no response, call [phone]

## 2019-11-05 ENCOUNTER — Inpatient Hospital Stay (HOSPITAL_COMMUNITY): Payer: Medicaid Other

## 2019-11-05 DIAGNOSIS — E1165 Type 2 diabetes mellitus with hyperglycemia: Secondary | ICD-10-CM

## 2019-11-05 DIAGNOSIS — E119 Type 2 diabetes mellitus without complications: Secondary | ICD-10-CM

## 2019-11-05 DIAGNOSIS — I447 Left bundle-branch block, unspecified: Secondary | ICD-10-CM | POA: Diagnosis present

## 2019-11-05 DIAGNOSIS — R748 Abnormal levels of other serum enzymes: Secondary | ICD-10-CM

## 2019-11-05 DIAGNOSIS — N179 Acute kidney failure, unspecified: Secondary | ICD-10-CM | POA: Diagnosis present

## 2019-11-05 DIAGNOSIS — N289 Disorder of kidney and ureter, unspecified: Secondary | ICD-10-CM

## 2019-11-05 LAB — GLUCOSE, CAPILLARY
Glucose-Capillary: 176 mg/dL — ABNORMAL HIGH (ref 70–99)
Glucose-Capillary: 179 mg/dL — ABNORMAL HIGH (ref 70–99)
Glucose-Capillary: 207 mg/dL — ABNORMAL HIGH (ref 70–99)
Glucose-Capillary: 206 mg/dL — ABNORMAL HIGH (ref 70–99)
Glucose-Capillary: 193 mg/dL — ABNORMAL HIGH (ref 70–99)
Glucose-Capillary: 206 mg/dL — ABNORMAL HIGH (ref 70–99)
Glucose-Capillary: 177 mg/dL — ABNORMAL HIGH (ref 70–99)
Glucose-Capillary: 176 mg/dL — ABNORMAL HIGH (ref 70–99)
Glucose-Capillary: 242 mg/dL — ABNORMAL HIGH (ref 70–99)
Glucose-Capillary: 289 mg/dL — ABNORMAL HIGH (ref 70–99)
Glucose-Capillary: 162 mg/dL — ABNORMAL HIGH (ref 70–99)
Glucose-Capillary: 140 mg/dL — ABNORMAL HIGH (ref 70–99)
Glucose-Capillary: 233 mg/dL — ABNORMAL HIGH (ref 70–99)
Glucose-Capillary: 425 mg/dL — ABNORMAL HIGH (ref 70–99)
Glucose-Capillary: 510 mg/dL (ref 70–99)
Glucose-Capillary: 250 mg/dL — ABNORMAL HIGH (ref 70–99)

## 2019-11-05 LAB — BASIC METABOLIC PANEL
Anion gap: 10 (ref 5–15)
Anion gap: 13 (ref 5–15)
BUN: 31 mg/dL — ABNORMAL HIGH (ref 6–20)
BUN: 29 mg/dL — ABNORMAL HIGH (ref 6–20)
CO2: 21 mmol/L — ABNORMAL LOW (ref 22–32)
CO2: 21 mmol/L — ABNORMAL LOW (ref 22–32)
Calcium: 9.2 mg/dL (ref 8.9–10.3)
Calcium: 9 mg/dL (ref 8.9–10.3)
Chloride: 115 mmol/L — ABNORMAL HIGH (ref 98–111)
Chloride: 108 mmol/L (ref 98–111)
Creatinine, Ser: 2.32 mg/dL — ABNORMAL HIGH (ref 0.61–1.24)
Creatinine, Ser: 2.2 mg/dL — ABNORMAL HIGH (ref 0.61–1.24)
GFR calc Af Amer: 39 mL/min — ABNORMAL LOW (ref >60)
GFR calc Af Amer: 42 mL/min — ABNORMAL LOW (ref >60)
GFR calc non Af Amer: 34 mL/min — ABNORMAL LOW (ref >60)
GFR calc non Af Amer: 36 mL/min — ABNORMAL LOW (ref >60)
Glucose, Bld: 250 mg/dL — ABNORMAL HIGH (ref 70–99)
Glucose, Bld: 531 mg/dL (ref 70–99)
Potassium: 2.9 mmol/L — ABNORMAL LOW (ref 3.5–5.1)
Potassium: 3.1 mmol/L — ABNORMAL LOW (ref 3.5–5.1)
Sodium: 146 mmol/L — ABNORMAL HIGH (ref 135–145)
Sodium: 142 mmol/L (ref 135–145)

## 2019-11-05 LAB — CBC WITH DIFFERENTIAL/PLATELET
Abs Immature Granulocytes: 0.04 10*3/uL (ref 0.00–0.07)
Basophils Absolute: 0 10*3/uL (ref 0.0–0.1)
Basophils Relative: 0 %
Eosinophils Absolute: 0 10*3/uL (ref 0.0–0.5)
Eosinophils Relative: 0 %
HCT: 34.2 % — ABNORMAL LOW (ref 39.0–52.0)
Hemoglobin: 12.6 g/dL — ABNORMAL LOW (ref 13.0–17.0)
Immature Granulocytes: 0 %
Lymphocytes Relative: 14 %
Lymphs Abs: 1.2 10*3/uL (ref 0.7–4.0)
MCH: 29.7 pg (ref 26.0–34.0)
MCHC: 36.8 g/dL — ABNORMAL HIGH (ref 30.0–36.0)
MCV: 80.7 fL (ref 80.0–100.0)
Monocytes Absolute: 1 10*3/uL (ref 0.1–1.0)
Monocytes Relative: 12 %
Neutro Abs: 6.6 10*3/uL (ref 1.7–7.7)
Neutrophils Relative %: 74 %
Platelets: 165 10*3/uL (ref 150–400)
RBC: 4.24 MIL/uL (ref 4.22–5.81)
RDW: 13.2 % (ref 11.5–15.5)
WBC: 9 10*3/uL (ref 4.0–10.5)
nRBC: 0 % (ref 0.0–0.2)

## 2019-11-05 LAB — COMPREHENSIVE METABOLIC PANEL
ALT: 24 U/L (ref 0–44)
ALT: 23 U/L (ref 0–44)
AST: 25 U/L (ref 15–41)
AST: 23 U/L (ref 15–41)
Albumin: 3.3 g/dL — ABNORMAL LOW (ref 3.5–5.0)
Albumin: 3 g/dL — ABNORMAL LOW (ref 3.5–5.0)
Alkaline Phosphatase: 86 U/L (ref 38–126)
Alkaline Phosphatase: 80 U/L (ref 38–126)
Anion gap: 14 (ref 5–15)
Anion gap: 10 (ref 5–15)
BUN: 38 mg/dL — ABNORMAL HIGH (ref 6–20)
BUN: 34 mg/dL — ABNORMAL HIGH (ref 6–20)
CO2: 17 mmol/L — ABNORMAL LOW (ref 22–32)
CO2: 20 mmol/L — ABNORMAL LOW (ref 22–32)
Calcium: 9.9 mg/dL (ref 8.9–10.3)
Calcium: 9.5 mg/dL (ref 8.9–10.3)
Chloride: 116 mmol/L — ABNORMAL HIGH (ref 98–111)
Chloride: 117 mmol/L — ABNORMAL HIGH (ref 98–111)
Creatinine, Ser: 2.47 mg/dL — ABNORMAL HIGH (ref 0.61–1.24)
Creatinine, Ser: 2.31 mg/dL — ABNORMAL HIGH (ref 0.61–1.24)
GFR calc Af Amer: 36 mL/min — ABNORMAL LOW (ref >60)
GFR calc Af Amer: 39 mL/min — ABNORMAL LOW (ref >60)
GFR calc non Af Amer: 31 mL/min — ABNORMAL LOW (ref >60)
GFR calc non Af Amer: 34 mL/min — ABNORMAL LOW (ref >60)
Glucose, Bld: 192 mg/dL — ABNORMAL HIGH (ref 70–99)
Glucose, Bld: 206 mg/dL — ABNORMAL HIGH (ref 70–99)
Potassium: 2.9 mmol/L — ABNORMAL LOW (ref 3.5–5.1)
Potassium: 2.5 mmol/L — CL (ref 3.5–5.1)
Sodium: 147 mmol/L — ABNORMAL HIGH (ref 135–145)
Sodium: 147 mmol/L — ABNORMAL HIGH (ref 135–145)
Total Bilirubin: 1.5 mg/dL — ABNORMAL HIGH (ref 0.3–1.2)
Total Bilirubin: 1.4 mg/dL — ABNORMAL HIGH (ref 0.3–1.2)
Total Protein: 6.1 g/dL — ABNORMAL LOW (ref 6.5–8.1)
Total Protein: 5.6 g/dL — ABNORMAL LOW (ref 6.5–8.1)

## 2019-11-05 LAB — MAGNESIUM: Magnesium: 2.1 mg/dL (ref 1.7–2.4)

## 2019-11-05 LAB — LIPASE, BLOOD: Lipase: 266 U/L — ABNORMAL HIGH (ref 11–51)

## 2019-11-05 MED ORDER — POTASSIUM CHLORIDE 20 MEQ PO PACK: 40.0000 meq | PACK | Freq: Once | ORAL | Status: DC

## 2019-11-05 MED ORDER — SODIUM CHLORIDE 0.9% FLUSH
10.0000 mL | Freq: Two times a day (BID) | INTRAVENOUS | Status: DC
  Administered 2019-11-06 – 2019-11-07 (×2): 10 mL

## 2019-11-05 MED ORDER — SODIUM CHLORIDE 0.9% FLUSH: 10.0000 mL | INTRAVENOUS | Status: DC | PRN

## 2019-11-05 MED ORDER — LIVING WELL WITH DIABETES BOOK
Freq: Once | Status: AC
  Filled 2019-11-05: qty 1

## 2019-11-05 MED ORDER — SODIUM CHLORIDE 0.45 % IV SOLN: INTRAVENOUS | Status: DC

## 2019-11-05 MED ORDER — INSULIN ASPART 100 UNIT/ML ~~LOC~~ SOLN
6.0000 [IU] | Freq: Three times a day (TID) | SUBCUTANEOUS | Status: DC
  Administered 2019-11-05 – 2019-11-07 (×6): 6 [IU] via SUBCUTANEOUS

## 2019-11-05 MED ORDER — POTASSIUM CHLORIDE 20 MEQ PO PACK
40.0000 meq | PACK | Freq: Once | ORAL | Status: AC
  Administered 2019-11-05: 40 meq via ORAL
  Filled 2019-11-05: qty 2

## 2019-11-05 MED ORDER — INSULIN ASPART 100 UNIT/ML ~~LOC~~ SOLN: 0.0000 [IU] | Freq: Every day | SUBCUTANEOUS | Status: DC

## 2019-11-05 MED ORDER — INSULIN GLARGINE 100 UNIT/ML ~~LOC~~ SOLN
20.0000 [IU] | Freq: Every day | SUBCUTANEOUS | Status: DC
  Filled 2019-11-05: qty 0.2

## 2019-11-05 MED ORDER — INSULIN GLARGINE 100 UNIT/ML ~~LOC~~ SOLN
45.0000 [IU] | Freq: Every day | SUBCUTANEOUS | Status: DC
  Administered 2019-11-05 – 2019-11-07 (×3): 45 [IU] via SUBCUTANEOUS
  Filled 2019-11-05 (×4): qty 0.45

## 2019-11-05 MED ORDER — INSULIN ASPART 100 UNIT/ML ~~LOC~~ SOLN: 0.0000 [IU] | Freq: Three times a day (TID) | SUBCUTANEOUS | Status: DC

## 2019-11-05 MED ORDER — POTASSIUM CHLORIDE CRYS ER 20 MEQ PO TBCR
30.0000 meq | EXTENDED_RELEASE_TABLET | Freq: Two times a day (BID) | ORAL | Status: DC
  Administered 2019-11-05: 30 meq via ORAL
  Filled 2019-11-05: qty 1

## 2019-11-05 MED ORDER — INSULIN ASPART 100 UNIT/ML ~~LOC~~ SOLN
0.0000 [IU] | Freq: Three times a day (TID) | SUBCUTANEOUS | Status: DC
  Administered 2019-11-05: 20 [IU] via SUBCUTANEOUS
  Administered 2019-11-06: 4 [IU] via SUBCUTANEOUS
  Administered 2019-11-06: 11 [IU] via SUBCUTANEOUS
  Administered 2019-11-06: 15 [IU] via SUBCUTANEOUS
  Administered 2019-11-07 (×2): 11 [IU] via SUBCUTANEOUS

## 2019-11-05 MED ORDER — POTASSIUM CHLORIDE 10 MEQ/100ML IV SOLN
10.0000 meq | INTRAVENOUS | Status: DC
  Administered 2019-11-05 (×5): 10 meq via INTRAVENOUS
  Filled 2019-11-05 (×5): qty 100

## 2019-11-05 MED ORDER — INSULIN ASPART 100 UNIT/ML ~~LOC~~ SOLN
15.0000 [IU] | Freq: Once | SUBCUTANEOUS | Status: AC
  Administered 2019-11-05: 15 [IU] via SUBCUTANEOUS

## 2019-11-05 MED ORDER — INSULIN ASPART 100 UNIT/ML ~~LOC~~ SOLN
0.0000 [IU] | Freq: Every day | SUBCUTANEOUS | Status: DC
  Administered 2019-11-05 – 2019-11-06 (×2): 2 [IU] via SUBCUTANEOUS

## 2019-11-05 NOTE — Progress Notes (Signed)
PROGRESS NOTE  Bob Reyes NTI:[id number] DOB: August 04, 1980 DOA: 11/02/2019 PCP: Rometta Emery, MD  Brief History   39 year old man PMH schizophrenia previously on Zyprexa was found down by his mother.  Admitted for severe DKA with pH less than 7 and glucose over 1000.  New diagnosis diabetes mellitus.  Seen by psychiatry with recommendation to transition to Haldol, discontinue olanzapine and risperidone.   A & P  DKA (diabetic ketoacidoses) (HCC) --new dx DM. Severe w/ PH 6.99 on admission, AG not calculated, glucose 1116. No precipitating factors noted other than untreated/unknown DM. --DKA now resolved.  DM type 2 (HCC) --new dx. Hgb A1c 11.7. --stop Zyprexa and HCTZ --consult DM coordinator, dietician for education --best practice would be to d/c on insulin, however it does not appear patient is capable and not clear he has the daily support to manage this. Appreciate DM coordinator --[facility] consult for assistance w/ safe discharge --start Lantus, meal coverage, SSI; stop insulin infusion  Paranoid schizophrenia, chronic condition (HCC) --appears stable, calm --off Zyprexa as outpt, on Haldol as outpt --continue Haldol  Renal insufficiency --all AKI vs AKI + CKD  --Creatine WNL 08/2016 --mild improvement since 9/18. U/a was unremarkable --check renal u/s.  --IVF, check BMP in AM --if remains elevated will need outpt nephrology follow-up   AKI (acute kidney injury) (HCC) --secondary to volume depletion from severe hyperglycemia on admission --was associated w/ severe hyperkalemia 7.3 on admission --improved from admit but might be at plateau now  LBBB (left bundle branch block) --EKG showed SR w/ wide QRS, atypical for LBBB but with inferolateral twave inversions. --new compared to 06/2016 EKG --repeat EKG in AM --asymptomatic. Recommend outpatient f/u  Elevated lipase --sig unclear, not clearly symptomatic on admission; no symptoms now; may be secondary to  DKA --continue diet --check lipase and TG in AM  Disposition Plan:  Discussion: Clinically improved, however given previous relapse with severe hyperglycemia, will need to monitor at least another 24 to 48 hours to stabilize subcutaneous insulin dosage.  Disposition may be a challenge as the patient probably will be able to administer his own insulin and has limited support.  TOC involved. Can transfer to floor.  Status is: Inpatient  Remains inpatient appropriate because:IV treatments appropriate due to intensity of illness or inability to take PO and Inpatient level of care appropriate due to severity of illness   Dispo: The patient is from: Home              Anticipated d/c is to: Home              Anticipated d/c date is: 2 days              Patient currently is not medically stable to d/c.  DVT prophylaxis: heparin injection 5,000 Units Start: 11/02/19 2200 SCDs Start: 11/02/19 2011   Code Status: Full Code Family Communication:   Brendia Sacks, MD  [facility] Direct contact: see [email or web address] (further directions at bottom of note if needed) 7PM-7AM contact night coverage as at bottom of note 11/05/2019, 3:34 PM  LOS: 3 days   Significant Hospital Events   . 9/18 admit for DKA, new dx DM   Consults:  . PCCM admitted . Transferred to [facility] 9/22    Interval History/Subjective  CC: f/u DM  Feels better, tolerating diet, no pain, breathing fine   Objective   Vitals:  Vitals:   11/05/19 1430 11/05/19 1526  BP:  (!) 165/112  Pulse: 79 (!) 106  Resp: 13 18  Temp:  98.6 F (37 C)  SpO2: 99% 100%    Exam:  Physical Exam Constitutional:      General: He is not in acute distress.    Appearance: Normal appearance.  Cardiovascular:     Rate and Rhythm: Normal rate and regular rhythm.     Heart sounds: Normal heart sounds. No murmur heard.  No gallop.      Comments: Telemetry SR Pulmonary:     Effort: Pulmonary effort is normal. No respiratory distress.      Breath sounds: Normal breath sounds. No wheezing, rhonchi or rales.  Neurological:     Mental Status: He is alert.  Psychiatric:        Mood and Affect: Mood normal.        Behavior: Behavior normal.      I have personally reviewed the following:   Today's Data  . CBG stable . K+ 2.5 > 2.9 . Creatinine 2.31 > 2.32 . AG 10  Scheduled Meds: . Chlorhexidine Gluconate Cloth  6 each Topical Daily  . haloperidol  5 mg Oral QHS  . heparin  5,000 Units Subcutaneous Q8H  . insulin aspart  0-20 Units Subcutaneous TID WC  . insulin aspart  0-5 Units Subcutaneous QHS  . insulin aspart  6 Units Subcutaneous TID WC  . insulin glargine  45 Units Subcutaneous Daily  . sodium chloride flush  10-40 mL Intracatheter Q12H   Continuous Infusions:  Principal Problem:   DKA (diabetic ketoacidoses) (HCC) Active Problems:   DM type 2 (HCC)   Renal insufficiency   AKI (acute kidney injury) (HCC)   LBBB (left bundle branch block)   Paranoid schizophrenia, chronic condition (HCC)   Elevated lipase   LOS: 3 days   How to contact the [facility] Attending or Consulting provider 7A - 7P or covering provider during after hours 7P -7A, for this patient?  1. Check the care team in [facility] and look for a) attending/consulting [facility] provider listed and b) the [facility] team listed 2. Log into [email or web address] and use [place]'s universal password to access. If you do not have the password, please contact the hospital operator. 3. Locate the [facility] provider you are looking for under [facility] and page to a number that you can be directly reached. 4. If you still have difficulty reaching the provider, please page the [facility] (Director on Call) for the Hospitalists listed on amion for assistance.

## 2019-11-05 NOTE — Plan of Care (Signed)
  Problem: Education: Goal: Knowledge of General Education information will improve Description: Including pain rating scale, medication(s)/side effects and non-pharmacologic comfort measures Outcome: Progressing   Problem: Clinical Measurements: Goal: Respiratory complications will improve Outcome: Progressing Note: On room air   Problem: Activity: Goal: Risk for activity intolerance will decrease Outcome: Progressing Note: Up in room ad lib tolerating well   Problem: Nutrition: Goal: Adequate nutrition will be maintained Outcome: Progressing   Problem: Coping: Goal: Level of anxiety will decrease Outcome: Progressing   Problem: Elimination: Goal: Will not experience complications related to urinary retention Outcome: Progressing Note: Using urinal   Problem: Pain Managment: Goal: General experience of comfort will improve Outcome: Progressing Note: No complaints of pain

## 2019-11-05 NOTE — Progress Notes (Signed)
CRITICAL VALUE STICKER  CRITICAL VALUE: glucose 531 from BMP  MD NOTIFIED: Dr. Marikay Alar  TIME OF NOTIFICATION: 11/05/19 1919  RESPONSE: MD to look over pt's chart and place orders

## 2019-11-05 NOTE — Assessment & Plan Note (Addendum)
--  all AKI vs AKI + CKD  --Creatine WNL 08/2016 --mild improvement since 9/18. U/a was unremarkable --check renal u/s.  --IVF, check BMP in AM --if remains elevated will need outpt nephrology follow-up

## 2019-11-05 NOTE — Progress Notes (Signed)
CRITICAL VALUE ALERT  Critical Value:  Potassium 2.5  Date & Time Notied:  0730 11/05/19  Provider Notified: Dr. Terese Door  Orders Received/Actions taken: Awaiting new orders

## 2019-11-05 NOTE — Assessment & Plan Note (Addendum)
--  new dx. Hgb A1c 11.7. --stop Zyprexa and HCTZ --consult DM coordinator, dietician for education --best practice would be to d/c on insulin, however it does not appear patient is capable and not clear he has the daily support to manage this. Appreciate DM coordinator --[facility] consult for assistance w/ safe discharge --start Lantus, meal coverage, SSI; stop insulin infusion

## 2019-11-05 NOTE — Progress Notes (Addendum)
Nutrition Consult Diet Education  Lab Results  Component Value Date   HGBA1C 11.7 (H) 11/03/2019    Received consult for new onset diabetes diet education. Visited patient in room and discussed a few recommendations for diabetes diet, including limiting sugar sweetened beverages and eating 3 meals per day. Patient asked a few questions, but seemed confused with RD's answers. He had limited understanding of the information discussed. He said his mom would be here later in the day to talk about his diet. DM Coordinator has talked with patient and his mother. Noted concern for patient's ability to care for DM independently. Provided "Carbohydrate Counting for People with Diabetes" handout from the [employer] of [employer] for patient and his mother's review. RD to follow-up at a later date/time as able to discuss diabetes diet recommendations with patient's mom.   Body mass index is 27.82 kg/m. Pt meets criteria for overweight based on current BMI.  Current diet order is CHO modified, patient is consuming approximately 50% of meals at this time. Labs and medications reviewed. No further nutrition interventions warranted at this time. RD contact information provided. If additional nutrition issues arise, please re-consult RD.  Gabriel Rainwater, RD, LDN, CNSC Please refer to [facility] for contact information.

## 2019-11-05 NOTE — Progress Notes (Signed)
eLink Physician-Brief Progress Note Patient Name: Bob Reyes DOB: March 22, 1980 MRN: [id number]   Date of Service  11/05/2019  HPI/Events of Note  Hypokalemia - K+ = 2.9 and Creatinine = 2.47 (improved from 2.63).   eICU Interventions  Will replace K+.      Intervention Category Major Interventions: Electrolyte abnormality - evaluation and management  [name] 11/05/2019, 4:15 AM

## 2019-11-05 NOTE — Assessment & Plan Note (Addendum)
--  new dx DM. Severe w/ PH 6.99 on admission, AG not calculated, glucose 1116. No precipitating factors noted other than untreated/unknown DM. --DKA now resolved.

## 2019-11-05 NOTE — Assessment & Plan Note (Addendum)
--  EKG showed SR w/ wide QRS, atypical for LBBB but with inferolateral twave inversions. --new compared to 06/2016 EKG --repeat EKG in AM --asymptomatic. Recommend outpatient f/u

## 2019-11-05 NOTE — Progress Notes (Addendum)
Inpatient Diabetes Program Recommendations  AACE/ADA: New Consensus Statement on Inpatient Glycemic Control (2015)  Target Ranges:  Prepandial:   less than 140 mg/dL      Peak postprandial:   less than 180 mg/dL (1-2 hours)      Critically ill patients:  140 - 180 mg/dL   Lab Results  Component Value Date   GLUCAP 289 (H) 11/05/2019   HGBA1C 11.7 (H) 11/03/2019    Review of Glycemic Control Results for Bob Reyes, Bob Reyes (MRN [id number]) as of 11/05/2019 10:45  Ref. Range 11/05/2019 05:47 11/05/2019 06:48 11/05/2019 07:49 11/05/2019 08:47 11/05/2019 09:15  Glucose-Capillary Latest Ref Range: 70 - 99 mg/dL 193 (H) 177 (H) 176 (H) 242 (H) 289 (H)   Diabetes history: DM 2- NEW diagnosis Outpatient Diabetes medications:   Current orders for Inpatient glycemic control:  IV insulin to SQ Lantus 20 units daily, Novolog resistant tid with meals and HS and Novolog 6 units tid with meals   Inpatient Diabetes Program Recommendations:    Recommend increase of Lantus to 45 units daily (patient received Lantus 30 units yesterday when transitioning off insulin drip but then had to go back on insulin drip).   Spoke with patient's mother this morning by phone.  She states that patient "will not" be able to take insulin and manage his diabetes on his own.  She states that possibly patient could come home with her, however she is disabled.  She will be here this afternoon and states that she needs to learn more about diet and what he can eat.  Will plan on meeting with her then. Also put in [facility] consult so that patient will have safe discharge plan.   Thanks,  Adah Perl, RN, BC-ADM Inpatient Diabetes Coordinator Pager [phone] (8a-5p)  Addendum 1320:  Met with patient and his mother in the room.  We discussed new diagnosis of DM and what diabetes is.  Patient was very polite but did not seem to completely understand what diabetes is.  While I was in the room, the Nurse from Dr. Leontine Locket office called to  check on him.  He told her "I didn't know I had diabetes". We discussed importance of eliminating sugar from his drinks and avoiding juices, gatorade, and sodas.  Mother states that he "eats everything". I attempted to show patient the insulin pen and how to apply needle, prime insulin pen, and inject insulin.  Patient said "I cannot do that to myself".  Attempted to show mother b/c she states that she lives close by and she also refused stating that she could not give a shot.  Patient states that he thinks the ACT team can help him with taking insulin and diabetes medications.  We briefly discussed signs and symptoms of both high and low blood sugars as well.  Told patient the symptoms of low blood sugar and how to treat with 1/2 cup of soda or 1/2 cup of regular juice.  I am very concerned about patient's ability to care for DM independently.  Discussed with RN. Patients mother took DM book and diet information home with her stating "I'll read this".  Will f/u on 9/22.  Awaiting TOC consult to see if there are any resources available to help patient with management of DM at home.

## 2019-11-05 NOTE — Assessment & Plan Note (Signed)
--  sig unclear, not clearly symptomatic on admission; no symptoms now; may be secondary to DKA --continue diet --check lipase and TG in AM

## 2019-11-05 NOTE — Hospital Course (Signed)
39 year old man PMH schizophrenia previously on Zyprexa was found down by his mother.  Admitted for severe DKA with pH less than 7 and glucose over 1000.  New diagnosis diabetes mellitus.  Seen by psychiatry with recommendation to transition to Haldol, discontinue olanzapine and risperidone.

## 2019-11-05 NOTE — Assessment & Plan Note (Signed)
--  appears stable, calm --off Zyprexa as outpt, on Haldol as outpt --continue Haldol

## 2019-11-05 NOTE — Assessment & Plan Note (Addendum)
--  secondary to volume depletion from severe hyperglycemia on admission --was associated w/ severe hyperkalemia 7.3 on admission --improved from admit but might be at plateau now

## 2019-11-05 NOTE — Progress Notes (Signed)
Pt's K 2.9 with CCM made aware via Elink.

## 2019-11-06 DIAGNOSIS — E0810 Diabetes mellitus due to underlying condition with ketoacidosis without coma: Secondary | ICD-10-CM

## 2019-11-06 DIAGNOSIS — N179 Acute kidney failure, unspecified: Secondary | ICD-10-CM

## 2019-11-06 DIAGNOSIS — E1310 Other specified diabetes mellitus with ketoacidosis without coma: Secondary | ICD-10-CM

## 2019-11-06 LAB — GLUCOSE, CAPILLARY
Glucose-Capillary: 260 mg/dL — ABNORMAL HIGH (ref 70–99)
Glucose-Capillary: 314 mg/dL — ABNORMAL HIGH (ref 70–99)
Glucose-Capillary: 199 mg/dL — ABNORMAL HIGH (ref 70–99)
Glucose-Capillary: 214 mg/dL — ABNORMAL HIGH (ref 70–99)

## 2019-11-06 LAB — COMPREHENSIVE METABOLIC PANEL
ALT: 22 U/L (ref 0–44)
AST: 21 U/L (ref 15–41)
Albumin: 2.9 g/dL — ABNORMAL LOW (ref 3.5–5.0)
Alkaline Phosphatase: 78 U/L (ref 38–126)
Anion gap: 10 (ref 5–15)
BUN: 22 mg/dL — ABNORMAL HIGH (ref 6–20)
CO2: 24 mmol/L (ref 22–32)
Calcium: 9 mg/dL (ref 8.9–10.3)
Chloride: 111 mmol/L (ref 98–111)
Creatinine, Ser: 2.02 mg/dL — ABNORMAL HIGH (ref 0.61–1.24)
GFR calc Af Amer: 46 mL/min — ABNORMAL LOW (ref >60)
GFR calc non Af Amer: 40 mL/min — ABNORMAL LOW (ref >60)
Glucose, Bld: 186 mg/dL — ABNORMAL HIGH (ref 70–99)
Potassium: 2.7 mmol/L — CL (ref 3.5–5.1)
Sodium: 145 mmol/L (ref 135–145)
Total Bilirubin: 1.6 mg/dL — ABNORMAL HIGH (ref 0.3–1.2)
Total Protein: 5.8 g/dL — ABNORMAL LOW (ref 6.5–8.1)

## 2019-11-06 LAB — PHOSPHORUS: Phosphorus: 2.7 mg/dL (ref 2.5–4.6)

## 2019-11-06 LAB — MAGNESIUM: Magnesium: 2 mg/dL (ref 1.7–2.4)

## 2019-11-06 LAB — POTASSIUM: Potassium: 3.1 mmol/L — ABNORMAL LOW (ref 3.5–5.1)

## 2019-11-06 LAB — TRIGLYCERIDES: Triglycerides: 225 mg/dL — ABNORMAL HIGH (ref <150)

## 2019-11-06 MED ORDER — POTASSIUM CHLORIDE CRYS ER 20 MEQ PO TBCR
40.0000 meq | EXTENDED_RELEASE_TABLET | Freq: Once | ORAL | Status: AC
  Administered 2019-11-06: 40 meq via ORAL
  Filled 2019-11-06: qty 2

## 2019-11-06 MED ORDER — POTASSIUM CHLORIDE 10 MEQ/100ML IV SOLN
10.0000 meq | INTRAVENOUS | Status: AC
  Administered 2019-11-06 (×4): 10 meq via INTRAVENOUS
  Filled 2019-11-06 (×4): qty 100

## 2019-11-06 MED ORDER — POTASSIUM CHLORIDE CRYS ER 20 MEQ PO TBCR: 30.0000 meq | EXTENDED_RELEASE_TABLET | Freq: Two times a day (BID) | ORAL | Status: DC

## 2019-11-06 MED ORDER — POTASSIUM CHLORIDE CRYS ER 20 MEQ PO TBCR
40.0000 meq | EXTENDED_RELEASE_TABLET | ORAL | Status: AC
  Administered 2019-11-06 (×2): 40 meq via ORAL
  Filled 2019-11-06 (×2): qty 2

## 2019-11-06 MED ORDER — AMLODIPINE BESYLATE 5 MG PO TABS
5.0000 mg | ORAL_TABLET | Freq: Every day | ORAL | Status: DC
  Administered 2019-11-06 – 2019-11-07 (×2): 5 mg via ORAL
  Filled 2019-11-06 (×2): qty 1

## 2019-11-06 MED ORDER — HYDRALAZINE HCL 20 MG/ML IJ SOLN: 10.0000 mg | Freq: Four times a day (QID) | INTRAMUSCULAR | Status: DC | PRN

## 2019-11-06 NOTE — Progress Notes (Signed)
Received critical lab result--Potassium 2.7. Notified on call M. Denny,NP via text page.

## 2019-11-06 NOTE — Plan of Care (Signed)
  Problem: Education: Goal: Knowledge of General Education information will improve Description Including pain rating scale, medication(s)/side effects and non-pharmacologic comfort measures Outcome: Progressing   

## 2019-11-06 NOTE — Progress Notes (Addendum)
[facility]                                                                              Patient Demographics  Bob Reyes, is a 39 y.o. male, DOB - Oct 24, 1980, ZCH:[id number]  Admit date - 11/02/2019   Admitting Physician Kirtland Bouchard, MD  Outpatient Primary MD for the patient is Rometta Emery, MD  Outpatient specialists:   LOS - 4  days   Medical records reviewed and are as summarized below:    Chief Complaint  Patient presents with  . Fatigue  . Hyperglycemia       Brief summary   39 year old man PMH schizophrenia previously on Zyprexa was found down by his mother.  Admitted for severe DKA with pH less than 7 and glucose over 1000.  New diagnosis diabetes mellitus.  Seen by psychiatry with recommendation to transition to Haldol, discontinue olanzapine and risperidone.   Assessment & Plan    Principal Problem:   DKA (diabetic ketoacidoses) (HCC) with new diagnosis of diabetes mellitus, type II -pH less than 7, CBG> 1116 at the time of admission.  DKA currently resolved, insulin drip has been discontinued and patient has been transitioned to subcu insulin -Patient does not appear to get clear understanding or capable to administer insulin himself.  Discussed with DM coordinator, patient's mother was unable to do it as well. -Discussed about simplifying the regimen for the patient with basal insulin FlexPen and oral OR basal insulin with meal coverage.  He will likely not be able to do sliding scale himself.  Diabetic coordinator will discuss with the patient again today - SW will also discuss with ACT team if they can help administer insulin at patient's home  Acute kidney injury -Likely due to #1, creatinine 3.4 at the time of admission with DKA, lactic acidosis  - may have underlying CKD, last creatinine 1.0 on 08/31/2016 -Creatinine improving to 2.0 today -Renal ultrasound showed no obstruction or hydronephrosis, + underlying hepatic  steatosis -Will assess renal function in a.m., may likely need outpatient nephrology follow-up  Hypokalemia Potassium 2.7, placed on oral and IV replacement, will recheck again   Paranoid schizophrenia, chronic condition (HCC) -Currently calm and pleasant, off Zyprexa as outpt, on Haldol as outpt --Continue Haldol   LBBB (left bundle branch block) --EKG showed SR w/ wide QRS, atypical for LBBB but with inferolateral twave inversions, new compared to 06/2016 EKG --Needs outpatient work-up, currently no chest pain or shortness of breath  Elevated lipase --sig unclear, not clearly symptomatic on admission; no symptoms now; may be secondary to DKA.  - Lipase 266 on 9/21, no abdominal pain nausea or vomiting, tolerating diet --Triglycerides 225, will place on low-dose statin at the time of discharge although should improve with insulin and glycemic control  Hypertension - BP readings elevated, add norvasc 5mg  daily - hold off on HCTZ due to AKI     Code Status: Full code DVT Prophylaxis: Heparin subcu Family Communication: Discussed all imaging results, lab results, explained to the patient    Disposition Plan:     Status is: Inpatient  Remains inpatient appropriate because:Inpatient level of  care appropriate due to severity of illness   Dispo: The patient is from: Home              Anticipated d/c is to: Home              Anticipated d/c date is: 1 day              Patient currently is medically stable to d/c.  However patient is not able to comprehend insulin regimen and administer it himself. Disposition, on hold due to unsafe discharge      Time Spent in minutes   35 minutes  Procedures:  None  Consultants:   None  Antimicrobials:   Anti-infectives (From admission, onward)   Start     Dose/Rate Route Frequency Ordered Stop   11/02/19 1800  vancomycin (VANCOREADY) IVPB 2000 mg/400 mL  Status:  Discontinued        2,000 mg 200 mL/hr over 120 Minutes  Intravenous  Once 11/02/19 1723 11/02/19 2011   11/02/19 1715  ceFEPIme (MAXIPIME) 2 g in sodium chloride 0.9 % 100 mL IVPB        2 g 200 mL/hr over 30 Minutes Intravenous  Once 11/02/19 1713 11/02/19 1928   11/02/19 1715  metroNIDAZOLE (FLAGYL) IVPB 500 mg        500 mg 100 mL/hr over 60 Minutes Intravenous  Once 11/02/19 1713 11/02/19 2214   11/02/19 1715  vancomycin (VANCOCIN) IVPB 1000 mg/200 mL premix  Status:  Discontinued        1,000 mg 200 mL/hr over 60 Minutes Intravenous  Once 11/02/19 1713 11/02/19 1723          Medications  Scheduled Meds: . Chlorhexidine Gluconate Cloth  6 each Topical Daily  . haloperidol  5 mg Oral QHS  . heparin  5,000 Units Subcutaneous Q8H  . insulin aspart  0-20 Units Subcutaneous TID WC  . insulin aspart  0-5 Units Subcutaneous QHS  . insulin aspart  6 Units Subcutaneous TID WC  . insulin glargine  45 Units Subcutaneous Daily  . sodium chloride flush  10-40 mL Intracatheter Q12H  . sodium chloride flush  10-40 mL Intracatheter Q12H   Continuous Infusions: . sodium chloride 50 mL/hr at 11/05/19 1738   PRN Meds:.docusate sodium, lip balm, polyethylene glycol, sodium chloride flush, sodium chloride flush      Subjective:   Bob Reyes was seen and examined today.  No acute complaints, tolerating diet, no nausea vomiting or diarrhea.  He states he is willing to learn the insulin regimen but feels somewhat overwhelmed.  Denies chest pain, shortness of breath, abdominal pain, N/V/D/C, new weakness, numbess, tingling. No acute events overnight.    Objective:   Vitals:   11/05/19 1531 11/05/19 1956 11/06/19 0208 11/06/19 0443  BP:  (!) 154/96 (!) 137/93 (!) 151/96  Pulse:  98 85 83  Resp:  Temp:  98.4 F (36.9 C) 98.4 F (36.9 C) 98.5 F (36.9 C)  TempSrc:  Oral Oral Oral  SpO2:  100% 98% 98%  Weight:    101.7 kg  Height:  (1.956 m)       Intake/Output Summary (Last 24 hours) at 11/06/2019 1356 Last data filed  at 11/06/2019 0859 Gross per 24 hour  Intake 1129.36 ml  Output 2275 ml  Net -1145.64 ml     Wt Readings from Last 3 Encounters:  11/06/19 101.7 kg  02/15/17 101.4 kg  01/19/17 100.3 kg  Exam  General: Alert and oriented x 3, NAD  Cardiovascular: S1 S2 auscultated, no murmurs, RRR  Respiratory: Clear to auscultation bilaterally, no wheezing, rales or rhonchi  Gastrointestinal: Soft, nontender, nondistended, + bowel sounds  Ext: no pedal edema bilaterally  Neuro: no new deficits  Musculoskeletal: No digital cyanosis, clubbing  Skin: No rashes  Psych: Normal affect and demeanor, alert and oriented x3    Data Reviewed:  I have personally reviewed following labs and imaging studies  Micro Results Recent Results (from the past 240 hour(s))  Blood Culture (routine x 2)     Status: None (Preliminary result)   Collection Time: 11/02/19  5:12 PM   Specimen: BLOOD  Result Value Ref Range Status   Specimen Description BLOOD RIGHT ANTECUBITAL  Final   Special Requests   Final    BOTTLES DRAWN AEROBIC AND ANAEROBIC Blood Culture adequate volume   Culture   Final    NO GROWTH 4 DAYS Performed at [facility], [address], [place], [place] [address]    Report Status PENDING  Incomplete  SARS Coronavirus 2 by RT PCR (hospital order, performed in [facility] hospital lab) Nasopharyngeal Nasopharyngeal Swab     Status: None   Collection Time: 11/02/19  5:15 PM   Specimen: Nasopharyngeal Swab  Result Value Ref Range Status   SARS Coronavirus 2 NEGATIVE NEGATIVE Final    Comment: (NOTE) SARS-CoV-2 target nucleic acids are NOT DETECTED.  The SARS-CoV-2 RNA is generally detectable in upper and lower respiratory specimens during the acute phase of infection. The lowest concentration of SARS-CoV-2 viral copies this assay can detect is 250 copies / mL. A negative result does not preclude SARS-CoV-2 infection and should not be used as the sole basis for treatment or  other patient management decisions.  A negative result may occur with improper specimen collection / handling, submission of specimen other than nasopharyngeal swab, presence of viral mutation(s) within the areas targeted by this assay, and inadequate number of viral copies (<250 copies / mL). A negative result must be combined with clinical observations, patient history, and epidemiological information.  Fact Sheet for Patients:   BoilerBrush.com.cy  Fact Sheet for Healthcare Providers: https://pope.com/  This test is not yet approved or  cleared by the [place] FDA and has been authorized for detection and/or diagnosis of SARS-CoV-2 by FDA under an Emergency Use Authorization (EUA).  This EUA will remain in effect (meaning this test can be used) for the duration of the COVID-19 declaration under Section 564(b)(1) of the Act, 21 U.S.C. section 360bbb-3(b)(1), unless the authorization is terminated or revoked sooner.  Performed at [facility], [address], [place], [place] [address]   Urine culture     Status: None   Collection Time: 11/02/19  8:30 PM   Specimen: In/Out Cath Urine  Result Value Ref Range Status   Specimen Description IN/OUT CATH URINE  Final   Special Requests NONE  Final   Culture   Final    NO GROWTH Performed at [facility], [address] [place], [place] [address]    Report Status 11/04/2019 FINAL  Final  Blood Culture (routine x 2)     Status: None (Preliminary result)   Collection Time: 11/03/19  1:38 AM   Specimen: BLOOD LEFT ARM  Result Value Ref Range Status   Specimen Description BLOOD LEFT ARM  Final   Special Requests   Final    BOTTLES DRAWN AEROBIC AND ANAEROBIC  Blood Culture results may not be optimal due to an inadequate volume of blood received in culture bottles   Culture   Final    NO GROWTH 3 DAYS Performed at [facility], [address] [place], [place]  [address]    Report Status PENDING  Incomplete  MRSA PCR Screening     Status: None   Collection Time: 11/03/19  2:04 AM   Specimen: Nasopharyngeal  Result Value Ref Range Status   MRSA by PCR NEGATIVE NEGATIVE Final    Comment:        The GeneXpert MRSA Assay (FDA approved for NASAL specimens only), is one component of a comprehensive MRSA colonization surveillance program. It is not intended to diagnose MRSA infection nor to guide or monitor treatment for MRSA infections. Performed at [facility], [address] [place], [place] [address]     Radiology Reports US RENAL  Result Date: 11/05/2019 CLINICAL DATA:  Acute kidney injury EXAM: RENAL / URINARY TRACT ULTRASOUND COMPLETE COMPARISON:  None. FINDINGS: Right Kidney: Renal measurements: 12.1 x 5.1 x 5.7 cm = volume: 195 mL. Echogenicity within normal limits. No mass or hydronephrosis visualized. Left Kidney: Renal measurements: 11.3 x 6.3 x 5.5 cm = volume: 205 mL. Echogenicity within normal limits. No mass or hydronephrosis visualized. Bladder: Appears normal for degree of bladder distention. Other: The liver appears echogenic with decreased penetration. IMPRESSION: 1. Unremarkable appearance of the kidneys. 2. Findings suggestive of underlying hepatic steatosis. Electronically Signed   By: Katherine Mantle M.D.   On: 11/05/2019 20:46   DG Chest Port 1 View  Result Date: 11/02/2019 CLINICAL DATA:  Questionable sepsis. EXAM: PORTABLE CHEST 1 VIEW COMPARISON:  None. FINDINGS: The heart size and mediastinal contours are within normal limits. Both lungs are clear. The visualized skeletal structures are unremarkable. IMPRESSION: No active disease. Electronically Signed   By: Gerome Sam III M.D   On: 11/02/2019 18:03    Lab Data:  CBC: Recent Labs  Lab 11/02/19 1720 11/02/19 1740 11/02/19 2246 11/04/19 0548 11/05/19 0532  WBC 27.4*  --  24.3* 12.6* 9.0  NEUTROABS 20.5*  --   --   --  6.6  HGB 16.9 18.0* 16.6 13.5  12.6*  HCT 54.2* 53.0* 50.5 37.8* 34.2*  MCV 93.4  --  87.2 82.2 80.7  PLT 371  --  263 181 165   Basic Metabolic Panel: Recent Labs  Lab 11/05/19 0129 11/05/19 0532 11/05/19 0958 11/05/19 1735 11/06/19 0432  NA 147* 147* 146* 142 145  K 2.9* 2.5* 2.9* 3.1* 2.7*  CL 116* 117* 115* 108 111  CO2 17* 20* 21* 21* 24  GLUCOSE 192* 206* 250* 531* 186*  BUN 38* 34* 31* 29* 22*  CREATININE 2.47* 2.31* 2.32* 2.20* 2.02*  CALCIUM 9.9 9.5 9.2 9.0 9.0  MG  --   --  2.1  --  2.0  PHOS  --   --   --   --  2.7   GFR: Estimated Creatinine Clearance: 61.3 mL/min (A) (by C-G formula based on SCr of 2.02 mg/dL (H)). Liver Function Tests: Recent Labs  Lab 11/02/19 1925 11/05/19 0129 11/05/19 0532 11/06/19 0432  AST 35 25 23 21   ALT 31 24 23 22   ALKPHOS 105 86 80 78  BILITOT 2.9* 1.5* 1.4* 1.6*  PROT 6.4* 6.1* 5.6* 5.8*  ALBUMIN 3.7 3.3* 3.0* 2.9*   Recent Labs  Lab 11/02/19 2246 11/05/19 1735  LIPASE 991* 266*  AMYLASE 462*  --  No results for input(s): AMMONIA in the last 168 hours. Coagulation Profile: Recent Labs  Lab 11/02/19 1925  INR 1.3*   Cardiac Enzymes: No results for input(s): CKTOTAL, CKMB, CKMBINDEX, TROPONINI in the last 168 hours. BNP (last 3 results) No results for input(s): PROBNP in the last 8760 hours. HbA1C: Recent Labs    11/03/19 1641  HGBA1C 11.7*   CBG: Recent Labs  Lab 11/05/19 1656 11/05/19 1952 11/05/19 2202 11/06/19 0736 11/06/19 1226  GLUCAP 425* 510* 250* 260* 314*   Lipid Profile: Recent Labs    11/06/19 0432  TRIG 225*   Thyroid Function Tests: No results for input(s): TSH, T4TOTAL, FREET4, T3FREE, THYROIDAB in the last 72 hours. Anemia Panel: No results for input(s): VITAMINB12, FOLATE, FERRITIN, TIBC, IRON, RETICCTPCT in the last 72 hours. Urine analysis:    Component Value Date/Time   COLORURINE YELLOW 11/02/2019 2030   APPEARANCEUR HAZY (A) 11/02/2019 2030   LABSPEC 1.016 11/02/2019 2030   PHURINE 5.0  11/02/2019 2030   GLUCOSEU >=500 (A) 11/02/2019 2030   HGBUR MODERATE (A) 11/02/2019 2030   BILIRUBINUR NEGATIVE 11/02/2019 2030   KETONESUR 20 (A) 11/02/2019 2030   PROTEINUR 30 (A) 11/02/2019 2030   UROBILINOGEN 1.0 07/02/2007 0217   NITRITE NEGATIVE 11/02/2019 2030   LEUKOCYTESUR NEGATIVE 11/02/2019 2030     Sulayman Manning M.D. [facility] 11/06/2019, 1:56 PM   Call night coverage person covering after 7pm

## 2019-11-06 NOTE — Progress Notes (Signed)
Inpatient Diabetes Program Recommendations  AACE/ADA: New Consensus Statement on Inpatient Glycemic Control (2015)  Target Ranges:  Prepandial:   less than 140 mg/dL      Peak postprandial:   less than 180 mg/dL (1-2 hours)      Critically ill patients:  140 - 180 mg/dL   Lab Results  Component Value Date   GLUCAP 314 (H) 11/06/2019   HGBA1C 11.7 (H) 11/03/2019    Review of Glycemic Control  Inpatient Diabetes Program Recommendations:   I just instructed patient and mom @ bedside on one pen injection daily. Patient cannot remember steps unless I guide him by verbally calling the next step and attempted 4 times. Mom was able to successfully demonstrate on 4th attempt but not committing to giving to patient every day. 8 am was the best time for the mom to give patient his insulin on a daily basis when she can. The other issue of concern is knowing when to treat hypo and hyperglycemia. Mom is attempting contact to the ACT team for hopes he can be a part of it again. HH will certainly be needed for followup. Communicated with Dr. Isidoro Donning, Donn Pierini, and Williemae Natter.  Thank you, Billy Fischer. Ayauna Mcnay, RN, MSN, CDE  Diabetes Coordinator Inpatient Glycemic Control Team Team Pager [phone] (8am-5pm) 11/06/2019 3:29 PM

## 2019-11-06 NOTE — TOC Initial Note (Signed)
Transition of Care [facility]) - Initial/Assessment Note    Patient Details  Name: Bob Reyes MRN: [id number] Date of Birth: 1980-10-13  Transition of Care [facility]) CM/SW Contact:    Bob Hutching, LCSW Phone Number:  11/06/2019, 1:00 PM  Clinical Narrative:                 CSW spoke with pt and pt mother at bedside. Introduced self, role, reason for visit.  Pt from home alone, his mother lives in a complex next to his. He confirms home address and PCP. He does not use any DME to get around and was witnessed by this writer walking halls of unit unassisted. Pt previously had an ACT team  through [facility] but does NOT have one currently. We discussed how education with his insulin was going- pt acknowledges it is hard for him but is committed to learning. Pt mother states that she has "had a stroke so I can't help him." Pt acknowledges that even if he was active with ACT team that they dont visit daily and wouldn't be there everytime he needed insulin. There is not any service that [facility] team has identified that would be able to visit pt daily- discussed we could attempt to locate [facility] but with Medicaid this can be difficult and again they would not be there daily. Pt and pt mother state understanding. Pt and pt mother told this writer that they would continue to learn how to do insulin education with staff here.   At this time CSW discussed case with [facility] supervisor Verdon Cummins, pt does not meet criteria for SNF for insulin alone (this is also complicated by pt Medicaid only coverage). Diabetes coordinator requested group home placement- Verdon Cummins agreed that this would take weeks to months to locate and focus should remain on education with pt and pt mother. [facility] team supervisor has requested medical director review case for next steps.   Expected Discharge Plan: Home/Self Care Barriers to Discharge: Continued Medical Work up   Patient Goals and CMS Choice Patient states their goals for this hospitalization and  ongoing recovery are:: to have some help with his insulin   Choice offered to / list presented to : Patient  Expected Discharge Plan and Services Expected Discharge Plan: Home/Self Care In-house Referral: Clinical Social Work Discharge Planning Services: CM Consult Post Acute Care Choice: Home Health Living arrangements for the past 2 months: Apartment  Prior Living Arrangements/Services Living arrangements for the past 2 months: Apartment Lives with:: Self Patient language and need for interpreter reviewed:: Yes (no needs) Need for Family Participation in Patient Care: Yes (Comment) (assistance w/ insulin) Care giver support system in place?: Yes (comment) (mother; she is hesitant to help at this time)   Criminal Activity/Legal Involvement Pertinent to Current Situation/Hospitalization: No - Comment as needed  Activities of Daily Living Home Assistive Devices/Equipment: Eyeglasses, Cane (specify quad or straight) ADL Screening (condition at time of admission) Patient's cognitive ability adequate to safely complete daily activities?: Yes Is the patient deaf or have difficulty hearing?: No Does the patient have difficulty seeing, even when wearing glasses/contacts?: No Does the patient have difficulty concentrating, remembering, or making decisions?: No Patient able to express need for assistance with ADLs?: Yes Does the patient have difficulty dressing or bathing?: No Independently performs ADLs?: Yes (appropriate for developmental age) Does the patient have difficulty walking or climbing stairs?: No Weakness of Legs: None Weakness of Arms/Hands: None  Permission Sought/Granted Permission sought to share information with :  Family Supports Permission granted to share information with : Yes, Verbal Permission Granted  Share Information with NAME: Bob Reyes     Permission granted to share info w Relationship: mother  Permission granted to share info w Contact Information:  [phone]  Emotional Assessment Appearance:: Appears stated age Attitude/Demeanor/Rapport: Engaged Affect (typically observed): Accepting, Adaptable, Appropriate, Pleasant Orientation: : Oriented to Self, Oriented to Place, Oriented to  Time, Oriented to Situation Alcohol / Substance Use: Not Applicable Psych Involvement: Outpatient Provider (previously active w/ Vesta Mixer)  Admission diagnosis:  DKA (diabetic ketoacidoses) (HCC) [E11.10] Electrolyte abnormality [E87.8] Acute renal failure, unspecified acute renal failure type (HCC) [N17.9] Diabetic ketoacidosis without coma associated with other specified diabetes mellitus (HCC) [E13.10] Patient Active Problem List   Diagnosis Date Noted   DM type 2 (HCC) 11/05/2019   Renal insufficiency 11/05/2019   AKI (acute kidney injury) (HCC) 11/05/2019   LBBB (left bundle branch block) 11/05/2019   Elevated lipase 11/05/2019   DKA (diabetic ketoacidoses) (HCC) 11/02/2019   Weakness 01/16/2017   Essential hypertension, benign 09/08/2016   S/P ORIF (open reduction internal fixation) fracture 08/19/2016   Constipation due to opioid therapy 07/05/2016   Closed fracture of bone of right foot    Closed fracture of bone of left foot    Paranoid schizophrenia, chronic condition (HCC) 06/28/2016   Bilateral calcaneal fractures 06/27/2016   PCP:  Rometta Emery, MD Pharmacy:   [facility] [id number] ([address]), [place] - [address] [place] ([place]) [place] [address] Phone: [phone] Fax: [phone]   Readmission Risk Interventions Readmission Risk Prevention Plan 11/06/2019  Post Dischage Appt Complete  Medication Screening Complete  Transportation Screening Complete  Some recent data might be hidden

## 2019-11-06 NOTE — Social Work (Addendum)
4:42pm- [facility] has declined referral.  4:38pm- CSW and RNCM working to try and locate [facility]. This Clinical research associate has left messages with [facility] and [facility]. [facility] has declined.   No agency would be able to administer insulin daily.  Octavio Graves, MSW, LCSW [facility] Clinical Social Work

## 2019-11-07 LAB — CULTURE, BLOOD (ROUTINE X 2)
Culture: NO GROWTH
Special Requests: ADEQUATE

## 2019-11-07 LAB — GLUCOSE, CAPILLARY
Glucose-Capillary: 264 mg/dL — ABNORMAL HIGH (ref 70–99)
Glucose-Capillary: 275 mg/dL — ABNORMAL HIGH (ref 70–99)

## 2019-11-07 LAB — BASIC METABOLIC PANEL
Anion gap: 10 (ref 5–15)
BUN: 18 mg/dL (ref 6–20)
CO2: 26 mmol/L (ref 22–32)
Calcium: 9.1 mg/dL (ref 8.9–10.3)
Chloride: 106 mmol/L (ref 98–111)
Creatinine, Ser: 1.8 mg/dL — ABNORMAL HIGH (ref 0.61–1.24)
GFR calc Af Amer: 53 mL/min — ABNORMAL LOW (ref >60)
GFR calc non Af Amer: 46 mL/min — ABNORMAL LOW (ref >60)
Glucose, Bld: 274 mg/dL — ABNORMAL HIGH (ref 70–99)
Potassium: 3.1 mmol/L — ABNORMAL LOW (ref 3.5–5.1)
Sodium: 142 mmol/L (ref 135–145)

## 2019-11-07 MED ORDER — HALOPERIDOL 5 MG PO TABS
5.0000 mg | ORAL_TABLET | Freq: Every day | ORAL | 0 refills | Status: DC
Start: 2019-11-07 — End: 2021-04-22

## 2019-11-07 MED ORDER — AMLODIPINE BESYLATE 5 MG PO TABS
5.0000 mg | ORAL_TABLET | Freq: Every day | ORAL | 3 refills | Status: DC
Start: 2019-11-08 — End: 2024-01-05

## 2019-11-07 MED ORDER — INSULIN DEGLUDEC 100 UNIT/ML ~~LOC~~ SOPN: 45.0000 [IU] | PEN_INJECTOR | Freq: Every morning | SUBCUTANEOUS | 3 refills | Status: AC

## 2019-11-07 MED ORDER — LINAGLIPTIN 5 MG PO TABS: 5.0000 mg | ORAL_TABLET | Freq: Every day | ORAL | 3 refills | Status: AC

## 2019-11-07 MED ORDER — BLOOD GLUCOSE METER KIT: PACK | 0 refills | Status: AC

## 2019-11-07 MED ORDER — INSULIN PEN NEEDLE 32G X 4 MM MISC: 45.0000 [IU] | Freq: Every morning | 2 refills | Status: AC

## 2019-11-07 NOTE — Progress Notes (Signed)
Patient discharged to home with instructions given to his mother.

## 2019-11-07 NOTE — TOC Progression Note (Addendum)
Transition of Care (TOC) - Progression Note  Donn Pierini RN, BSN Transitions of Care Unit 4E- RN Case Manager See Treatment Team for direct phone # Cross coverage assistance for 6N   Patient Details  Name: Bob Reyes MRN: [id number] Date of Birth: 09/02/1980  Transition of Care [facility]) CM/SW Contact  Zenda Alpers, Lenn Sink, RN Phone Number: 11/07/2019, 11:04 AM  Clinical Narrative:    HHRN order placed for ongoing DM education- have reached out to multiple [facility] agencies to see if [facility] services could be secured for this pt, per CSW who spoke with pt there was no preference for agency based on Medicare choice offering.  Calls made to: [facility]- unable to accept-sfaffing [facility]- unable to accept-staffing [facility]- unable to accept-staffing [facility]- unable to accept-staffing [facility]- unable to accept- no Photographer- unable to accept- no RN availability [facility]- unable to accept [facility]- unable to accpet  Unfortunately unable to secure [facility] services for this pt with Medicaid. Will need to optimize education here at bedside prior to discharge.  Expected Discharge Plan: Home/Self Care Barriers to Discharge: Continued Medical Work up  Expected Discharge Plan and Services Expected Discharge Plan: Home/Self Care In-house Referral: Clinical Social Work Discharge Planning Services: CM Consult Post Acute Care Choice: Home Health Living arrangements for the past 2 months: Apartment                                       Social Determinants of Health (SDOH) Interventions    Readmission Risk Interventions Readmission Risk Prevention Plan 11/06/2019  Post Dischage Appt Complete  Medication Screening Complete  Transportation Screening Complete  Some recent data might be hidden

## 2019-11-07 NOTE — Discharge Instructions (Signed)

## 2019-11-07 NOTE — Plan of Care (Signed)
  RD consulted for nutrition education regarding diabetes.   Lab Results  Component Value Date   HGBA1C 11.7 (H) 11/03/2019   Spoke with pt and mom at bedside. Per mom, this is a new diagnosis for pt. Pt shares that he was consuming a lot of fast food and sugary beverages PTA. Focus on education was how to eat more healthfully at restaurants, substitutions for sugary beverages, and portion control.   Pt will need at of reinforcement. RD referred pt to outpatient diabetes education at [facility] for further support and reinforcement.   RD provided "Carbohydrate Counting for People with Diabetes" handout from the Academy of Nutrition and Dietetics. Discussed different food groups and their effects on blood sugar, emphasizing carbohydrate-containing foods. Provided list of carbohydrates and recommended serving sizes of common foods.  Discussed importance of controlled and consistent carbohydrate intake throughout the day. Provided examples of ways to balance meals/snacks and encouraged intake of high-fiber, whole grain complex carbohydrates. Teach back method used.  Expect fair compliance.  Current diet order is carb modified, patient is consuming approximately 100% of meals at this time. Labs and medications reviewed. No further nutrition interventions warranted at this time. RD contact information provided. If additional nutrition issues arise, please re-consult RD.  Levada Schilling, RD, LDN, CDCES Registered Dietitian II Certified Diabetes Care and Education Specialist Please refer to [facility] for RD and/or RD on-call/weekend/after hours pager

## 2019-11-07 NOTE — Discharge Summary (Signed)
Physician Discharge Summary   Patient ID: Bob Reyes MRN: [id number] DOB/AGE: 1980/08/19 39 y.o.  Admit date: 11/02/2019 Discharge date: 11/07/2019  Primary Care Physician:  Elwyn Reach, MD   Recommendations for Outpatient Follow-up:  1. Follow up with PCP in 1-2 weeks 2. Please obtain BMP in one week to follow renal function, if renal function not close to baseline, he needs outpatient nephrology follow-up. 3. HCTZ discontinued due to renal dysfunction, hypokalemia 4. Patient started on Haldol 5 mg daily at bedtime per psychiatry recommendations. 5. Patient started on Tresiba 45 units a.m. daily, Tradjenta 5 mg daily 6. Please check fasting lipid panel in 4 weeks, hopefully with better glycemic control, triglycerides should be  improving, currently 225  Home Health: None  Equipment/Devices:   Discharge Condition: stable  CODE STATUS: FULL  Diet recommendation:    Discharge Diagnoses:    . DKA (diabetic ketoacidoses) ([place])   New diagnosis of Diabetes Mellitus, Type II   Hypokalemia Essential hypertension . Paranoid schizophrenia, chronic condition ([place]) . AKI (acute kidney injury) ([place]) . LBBB (left bundle branch block)   Consults:   Critical care Psychiatry    Allergies:  No Known Allergies   DISCHARGE MEDICATIONS: Allergies as of 11/07/2019   No Known Allergies     Medication List    STOP taking these medications   hydrochlorothiazide 25 MG tablet Commonly known as: HYDRODIURIL   ibuprofen 200 MG tablet Commonly known as: ADVIL   OLANZapine zydis 10 MG disintegrating tablet Commonly known as: ZYPREXA     TAKE these medications   amLODipine 5 MG tablet Commonly known as: NORVASC Take 1 tablet (5 mg total) by mouth daily. Start taking on: November 08, 2019   blood glucose meter kit and supplies Dispense based on patient and insurance preference. Use up to four times daily as directed. (FOR ICD-10 E10.9, E11.9).   haloperidol 5 MG  tablet Commonly known as: HALDOL Take 1 tablet (5 mg total) by mouth at bedtime.   insulin degludec 100 UNIT/ML FlexTouch Pen Commonly known as: TRESIBA Inject 45 Units into the skin in the morning.   Insulin Pen Needle 32G X 4 MM Misc 45 Units by Does not apply route in the morning.   ketoconazole 2 % cream Commonly known as: NIZORAL Apply 1 application topically 2 (two) times daily as needed (facial blemishes).   linagliptin 5 MG Tabs tablet Commonly known as: TRADJENTA Take 1 tablet (5 mg total) by mouth daily.        Brief H and P: For complete details please refer to admission H and P, but in brief * 39 year old man PMH schizophrenia previously on Zyprexa was found down by his mother. Admitted for severe DKA with pH less than 7 and glucose over 1000. New diagnosis diabetes mellitus. Seen by psychiatry with recommendation to transition to Haldol daily, discontinued olanzapine and risperidone per recommendations  Hospital Course:  Severe DKA (diabetic ketoacidoses) ([place]) with new diagnosis of diabetes mellitus, type II -pH less than 7, CBG> 1116 at the time of admission.  DKA currently resolved, insulin drip has been discontinued and patient has been transitioned to subcu insulin -Patient does not appear to get clear understanding or capable to administer insulin himself.  -Regimen was simplified to [place] 45 units in the morning, which patient's mother is able to help him and Tradjenta 5 mg p.o. daily.  Patient was closely followed by Diabetic coordinator  -Home health RN order was placed and case manager reach out  to multiple [facility] agencies, difficulty excepting due to Medicaid.  Patient does not meet criteria for group home or any placement.  Recommended family support to the patient and extensive teaching was provided by the diabetic coordinator and the nursing with the insulin administration.  Acute kidney injury -Likely due to #1, creatinine 3.4 at the time of admission  with DKA, lactic acidosis  - may have underlying CKD, last creatinine 1.0 on 08/31/2016 -Creatinine continues to improve, 1.8 today -Renal ultrasound showed no obstruction or hydronephrosis, + underlying hepatic steatosis    Hypokalemia Replaced   Paranoid schizophrenia, chronic condition [facility]) - Psychiatry was consulted, seen by Dr. Darleene Cleaver, recommended to discontinue olanzapine and risperidone.  Atypical antipsychotics like olanzapine, risperidone, Seroquel, Invega all have stronger association with elevated blood sugar.  Recommended to continue Haldol 5 mg at bedtime less likely to cause hypoglycemia --Continue Haldol 5 mg at bedtime, outpatient follow-up with psychiatry   LBBB (left bundle branch block) --EKG showed SR w/ wide QRS, atypical for LBBB but with inferolateral twave inversions, new compared to 06/2016 EKG --Needs outpatient work-up, currently no chest pain or shortness of breath  Elevated lipase --sig unclear, not clearly symptomatic on admission; no symptoms now; may be secondary to DKA.  - Lipase 266 on 9/21, no abdominal pain nausea or vomiting, tolerating diet --Triglycerides 225,   Hypertension -BP readings were elevated, patient started on Norvasc 5 mg daily  Day of Discharge S: No acute complaints sitting up in the chair.  BP (!) 145/94 (BP Location: Left Arm)   Pulse 94   Temp 98.1 F (36.7 C) (Oral)   Resp 18   Ht _0  (1.956 m)   Wt 101.7 kg   SpO2 98%   BMI 26.60 kg/m   Physical Exam: General: Alert and awake oriented x3 not in any acute distress. HEENT: anicteric sclera, pupils reactive to light and accommodation CVS: S1-S2 clear no murmur rubs or gallops Chest: clear to auscultation bilaterally, no wheezing rales or rhonchi Abdomen: soft nontender, nondistended, normal bowel sounds Extremities: no cyanosis, clubbing or edema noted bilaterally Neuro: Cranial nerves II-XII intact, no focal neurological deficits    Get Medicines  reviewed and adjusted: Please take all your medications with you for your next visit with your Primary MD  Please request your Primary MD to go over all hospital tests and procedure/radiological results at the follow up. Please ask your Primary MD to get all Hospital records sent to his/her office.  If you experience worsening of your admission symptoms, develop shortness of breath, life threatening emergency, suicidal or homicidal thoughts you must seek medical attention immediately by calling 911 or calling your MD immediately  if symptoms less severe.  You must read complete instructions/literature along with all the possible adverse reactions/side effects for all the Medicines you take and that have been prescribed to you. Take any new Medicines after you have completely understood and accept all the possible adverse reactions/side effects.   Do not drive when taking pain medications.   Do not take more than prescribed Pain, Sleep and Anxiety Medications  Special Instructions: If you have smoked or chewed Tobacco  in the last 2 yrs please stop smoking, stop any regular Alcohol  and or any Recreational drug use.  Wear Seat belts while driving.  Please note  You were cared for by a hospitalist during your hospital stay. Once you are discharged, your primary care physician will handle any further medical issues. Please note that NO REFILLS for  any discharge medications will be authorized once you are discharged, as it is imperative that you return to your primary care physician (or establish a relationship with a primary care physician if you do not have one) for your aftercare needs so that they can reassess your need for medications and monitor your lab values.   The results of significant diagnostics from this hospitalization (including imaging, microbiology, ancillary and laboratory) are listed below for reference.      Procedures/Studies:  US RENAL  Result Date:  11/05/2019 CLINICAL DATA:  Acute kidney injury EXAM: RENAL / URINARY TRACT ULTRASOUND COMPLETE COMPARISON:  None. FINDINGS: Right Kidney: Renal measurements: 12.1 x 5.1 x 5.7 cm = volume: 195 mL. Echogenicity within normal limits. No mass or hydronephrosis visualized. Left Kidney: Renal measurements: 11.3 x 6.3 x 5.5 cm = volume: 205 mL. Echogenicity within normal limits. No mass or hydronephrosis visualized. Bladder: Appears normal for degree of bladder distention. Other: The liver appears echogenic with decreased penetration. IMPRESSION: 1. Unremarkable appearance of the kidneys. 2. Findings suggestive of underlying hepatic steatosis. Electronically Signed   By: Constance Holster M.D.   On: 11/05/2019 20:46   DG Chest Port 1 View  Result Date: 11/02/2019 CLINICAL DATA:  Questionable sepsis. EXAM: PORTABLE CHEST 1 VIEW COMPARISON:  None. FINDINGS: The heart size and mediastinal contours are within normal limits. Both lungs are clear. The visualized skeletal structures are unremarkable. IMPRESSION: No active disease. Electronically Signed   By: Dorise Bullion III M.D   On: 11/02/2019 18:03      LAB RESULTS: Basic Metabolic Panel: Recent Labs  Lab 11/06/19 0432 11/06/19 0432 11/06/19 1417 11/07/19 0430  NA 145  --   --  142  K 2.7*   < > 3.1* 3.1*  CL 111  --   --  106  CO2 24  --   --  26  GLUCOSE 186*  --   --  274*  BUN 22*  --   --  18  CREATININE 2.02*  --   --  1.80*  CALCIUM 9.0  --   --  9.1  MG 2.0  --   --   --   PHOS 2.7  --   --   --    < > = values in this interval not displayed.   Liver Function Tests: Recent Labs  Lab 11/05/19 0532 11/06/19 0432  AST 23 21  ALT 23 22  ALKPHOS 80 78  BILITOT 1.4* 1.6*  PROT 5.6* 5.8*  ALBUMIN 3.0* 2.9*   Recent Labs  Lab 11/02/19 2246 11/05/19 1735  LIPASE 991* 266*  AMYLASE 462*  --    No results for input(s): AMMONIA in the last 168 hours. CBC: Recent Labs  Lab 11/04/19 0548 11/04/19 0548 11/05/19 0532  WBC  12.6*  --  9.0  NEUTROABS  --   --  6.6  HGB 13.5  --  12.6*  HCT 37.8*  --  34.2*  MCV 82.2   < > 80.7  PLT 181  --  165   < > = values in this interval not displayed.   Cardiac Enzymes: No results for input(s): CKTOTAL, CKMB, CKMBINDEX, TROPONINI in the last 168 hours. BNP: Invalid input(s): POCBNP CBG: Recent Labs  Lab 11/07/19 0753 11/07/19 1223  GLUCAP 264* 275*       Disposition and Follow-up: Discharge Instructions    Diet Carb Modified   Complete by: As directed    Discharge instructions   Complete by:  As directed    It is VERY IMPORTANT that you follow up with a PCP on a regular basis.  Check your blood glucoses before each meal and at bedtime and maintain a log of your readings.  Bring this log with you when you follow up with your PCP so that he or she can adjust your insulin at your follow up visit.   Increase activity slowly   Complete by: As directed        DISPOSITION: *Home  DISCHARGE FOLLOW-UP  Follow-up Information    Elwyn Reach, MD. Schedule an appointment as soon as possible for a visit in 1 week(s).   Specialty: Internal Medicine Why: hospital follow-up  Contact information: [address] [place] [address] [phone]                Time coordinating discharge:  35 minutes  Signed:   Estill Cotta M.D. [facility] 11/07/2019, 12:51 PM

## 2019-11-07 NOTE — Progress Notes (Signed)
Inpatient Diabetes Program Recommendations  AACE/ADA: New Consensus Statement on Inpatient Glycemic Control (2015)  Target Ranges:  Prepandial:   less than 140 mg/dL      Peak postprandial:   less than 180 mg/dL (1-2 hours)      Critically ill patients:  140 - 180 mg/dL   Lab Results  Component Value Date   GLUCAP 264 (H) 11/07/2019   HGBA1C 11.7 (H) 11/03/2019    Review of Glycemic Control  Inpatient Diabetes Program Recommendations:   Spoke with Dr. Isidoro Donning and Donn Pierini by phone to discuss options of care for patient after discharge home. Home Health options were exhausted by Donn Pierini and group home is not an option.  Evaristo Bury would last patient longer with coverage up to 42 hrs.  Discharge needs: Tresiba insulin Order # [id number] Insulin pen needles [id number] CBG Meter & supplies [id number] Tradjenta 5 mg qd  Discussed above with Dr. Isidoro Donning based on patient limited on resources and dependent on mom to administer insulin injections.  Thank you, Billy Fischer. Maninder Deboer, RN, MSN, CDE  Diabetes Coordinator Inpatient Glycemic Control Team Team Pager [phone] (8am-5pm) 11/07/2019 11:54 AM

## 2019-11-09 LAB — CULTURE, BLOOD (ROUTINE X 2): Culture: NO GROWTH

## 2019-11-12 ENCOUNTER — Encounter: Payer: Self-pay | Admitting: Dietician

## 2019-11-12 ENCOUNTER — Other Ambulatory Visit: Payer: Self-pay

## 2019-11-12 ENCOUNTER — Encounter: Payer: Medicaid Other | Attending: Internal Medicine | Admitting: Dietician

## 2019-11-12 DIAGNOSIS — E119 Type 2 diabetes mellitus without complications: Secondary | ICD-10-CM | POA: Diagnosis not present

## 2019-11-12 NOTE — Progress Notes (Signed)
Diabetes Self-Management Education  Visit Type: First/Initial  Appt. Start Time: 0950 Appt. End Time: 1125  11/12/2019  [name], identified by name and date of birth, is a 39 y.o. male with a diagnosis of Diabetes: Type 2.   ASSESSMENT Patient is here today with his mother. Patient stated that he would like a FreeStyle LIbre.  Discussed that this may require more insulin injections per day but after discussing further with a colleague this may be an option to obtain a prescription and try to get this filled at a main line pharmacy such as [facility], [facility], or [facility] and see if this will go through.   Mom discussed that she would like to purchase a secondary insurance policy.  Left a message stating that Medicaid should be sufficient.  They are to call for questions.  History includes newly diagnosed Type 2 diabetes (10/2019), HTN, AKI, paranoid schizophrenia A1C 11.7%, BUN 18, Creatinine 1.8, Potassium 3.1 11/03/2019 Medication includes Lantus 45 units each am, Trajenta  Patient lives alone usually but has been living with his mother since the diabetes diagnosis to help him.  Patient does not know how to cook and would eat simply or eat out.  He is not employed.  He enjoys reading.   Height 6\' 4"  (1.93 m), weight 216 lb (98 kg). Body mass index is 26.29 kg/m.   Diabetes Self-Management Education - 11/12/19 1005      Visit Information   Visit Type First/Initial      Initial Visit   Diabetes Type Type 2    Are you currently following a meal plan? No    Are you taking your medications as prescribed? Yes    Date Diagnosed 11/02/2019      Health Coping   How would you rate your overall health? Good      Psychosocial Assessment   Patient Belief/Attitude about Diabetes Motivated to manage diabetes    Self-care barriers None;Debilitated state due to current medical condition    Self-management support Doctor's office;Family    Other persons present Patient;Family Member    mother   Patient Concerns Nutrition/Meal planning;Glycemic Control;Healthy Lifestyle    Special Needs None    Preferred Learning Style No preference indicated    Learning Readiness Ready    How often do you need to have someone help you when you read instructions, pamphlets, or other written materials from your doctor or pharmacy? 1 - Never    What is the last grade level you completed in school? 2 years college      Pre-Education Assessment   Patient understands the diabetes disease and treatment process. Needs Instruction    Patient understands incorporating nutritional management into lifestyle. Needs Instruction    Patient undertands incorporating physical activity into lifestyle. Needs Instruction    Patient understands using medications safely. Needs Instruction    Patient understands monitoring blood glucose, interpreting and using results Needs Instruction    Patient understands prevention, detection, and treatment of acute complications. Needs Instruction    Patient understands prevention, detection, and treatment of chronic complications. Needs Instruction    Patient understands how to develop strategies to address psychosocial issues. Needs Instruction    Patient understands how to develop strategies to promote health/change behavior. Needs Instruction      Complications   Last HgB A1C per patient/outside source 11.7 %   11/03/2019   How often do you check your blood sugar? 1-2 times/day    Fasting Blood glucose range (mg/dL) 11/05/2019  163-300   Postprandial Blood glucose range (mg/dL) 161-096;045-409;>811    Number of hypoglycemic episodes per month 0    Number of hyperglycemic episodes per week 14    Can you tell when your blood sugar is high? Yes    What do you do if your blood sugar is high? rest    Have you had a dilated eye exam in the past 12 months? Yes    Have you had a dental exam in the past 12 months? No    Are you checking your feet? Yes    How many  days per week are you checking your feet? 2      Dietary Intake   Breakfast oatmeal (regular or flavored instant) and Blueberry smoothie (blueberries, 1% milk, LF plain yogurt) OR Honey nut Cheerios, 1% milk OR skips    Lunch Mrs[employer] chicken OR salad, egg, cheese    Snack (afternoon) NABS, peanut butter crackers    Dinner lamb, mashed potatoes, green beans, SF sweet potato pie    Snack (evening) PB& Jelly sandwich    Beverage(s) Powerade, sweet tea, water, fruit punch      Exercise   Exercise Type Light (walking / raking leaves)    How many days per week to you exercise? 3    How many minutes per day do you exercise? 20    Total minutes per week of exercise 60      Patient Education   Previous Diabetes Education No    Disease state  Definition of diabetes, type 1 and 2, and the diagnosis of diabetes    Nutrition management  Role of diet in the treatment of diabetes and the relationship between the three main macronutrients and blood glucose level;Food label reading, portion sizes and measuring food.;Meal options for control of blood glucose level and chronic complications.    Physical activity and exercise  Role of exercise on diabetes management, blood pressure control and cardiac health.;Helped patient identify appropriate exercises in relation to his/her diabetes, diabetes complications and other health issue.    Medications Taught/reviewed insulin injection, site rotation, insulin storage and needle disposal.;Reviewed patients medication for diabetes, action, purpose, timing of dose and side effects.    Monitoring Purpose and frequency of SMBG.;Taught/discussed recording of test results and interpretation of SMBG.;Identified appropriate SMBG and/or A1C goals.;Daily foot exams;Yearly dilated eye exam    Acute complications Taught treatment of hypoglycemia - the 15 rule.;Discussed and identified patients' treatment of hyperglycemia.    Chronic complications Dental care;Relationship  between chronic complications and blood glucose control;Assessed and discussed foot care and prevention of foot problems    Psychosocial adjustment Worked with patient to identify barriers to care and solutions;Role of stress on diabetes;Identified and addressed patients feelings and concerns about diabetes    Personal strategies to promote health Lifestyle issues that need to be addressed for better diabetes care      Individualized Goals (developed by patient)   Nutrition General guidelines for healthy choices and portions discussed    Physical Activity Exercise 5-7 days per week;30 minutes per day    Medications take my medication as prescribed    Monitoring  test my blood glucose as discussed    Reducing Risk examine blood glucose patterns;do foot checks daily;treat hypoglycemia with 15 grams of carbs if blood glucose less than 70mg /dL;increase portions of healthy fats    Health Coping discuss diabetes with (comment)   MD, RD, CDCES     Post-Education Assessment   Patient understands  the diabetes disease and treatment process. Demonstrates understanding / competency    Patient understands incorporating nutritional management into lifestyle. Needs Review    Patient undertands incorporating physical activity into lifestyle. Demonstrates understanding / competency    Patient understands using medications safely. Demonstrates understanding / competency    Patient understands monitoring blood glucose, interpreting and using results Demonstrates understanding / competency    Patient understands prevention, detection, and treatment of acute complications. Demonstrates understanding / competency    Patient understands prevention, detection, and treatment of chronic complications. Demonstrates understanding / competency    Patient understands how to develop strategies to address psychosocial issues. Needs Review    Patient understands how to develop strategies to promote health/change behavior. Needs  Review      Outcomes   Expected Outcomes Demonstrated interest in learning. Expect positive outcomes    Future DMSE 2 wks    Program Status Not Completed           Individualized Plan for Diabetes Self-Management Training:   Learning Objective:  Patient will have a greater understanding of diabetes self-management. Patient education plan is to attend individual and/or group sessions per assessed needs and concerns.   Plan:   Patient Instructions  Check your blood sugar 2 times daily or as recommended. Continue to take your medication as prescribed Beverages should have 0 carbohydrates.  Water is great! Stay active.  Aim for at least 30 minutes most days. Breakfast, lunch, dinner daily Balance your meals.  Small snacks when hungry.     Expected Outcomes:  Demonstrated interest in learning. Expect positive outcomes  Education material provided: ADA - How to Thrive: A Guide for Your Journey with Diabetes, Food label handouts, A1C conversion sheet, Meal plan card, My Plate, Snack sheet, Support group flyer, No sodium seasonings and Diabetes Resources; types of fat  If problems or questions, patient to contact team via:  Phone  Future DSME appointment: 2 wks

## 2019-11-12 NOTE — Patient Instructions (Addendum)
Check your blood sugar 2 times daily or as recommended. Continue to take your medication as prescribed Beverages should have 0 carbohydrates.  Water is great! Stay active.  Aim for at least 30 minutes most days. Breakfast, lunch, dinner daily Balance your meals.  Small snacks when hungry.

## 2019-11-19 ENCOUNTER — Ambulatory Visit: Payer: Medicaid Other

## 2019-11-26 ENCOUNTER — Ambulatory Visit: Payer: Medicaid Other

## 2019-12-06 ENCOUNTER — Ambulatory Visit: Payer: Medicaid Other | Admitting: Registered"

## 2019-12-19 ENCOUNTER — Ambulatory Visit: Payer: Medicaid Other | Admitting: Dietician

## 2019-12-25 ENCOUNTER — Ambulatory Visit: Payer: Medicaid Other | Admitting: Podiatry

## 2020-01-21 ENCOUNTER — Ambulatory Visit: Payer: Medicaid Other | Admitting: Podiatry

## 2020-02-24 ENCOUNTER — Other Ambulatory Visit: Payer: Self-pay

## 2020-02-24 ENCOUNTER — Encounter (HOSPITAL_COMMUNITY): Payer: Self-pay

## 2020-02-24 ENCOUNTER — Emergency Department (HOSPITAL_COMMUNITY): Payer: Medicaid Other

## 2020-02-24 ENCOUNTER — Emergency Department (HOSPITAL_COMMUNITY)
Admission: EM | Admit: 2020-02-24 | Discharge: 2020-02-24 | Disposition: A | Discharge status: Home / Self Care | Payer: Medicaid Other | Attending: Emergency Medicine | Admitting: Emergency Medicine

## 2020-02-24 DIAGNOSIS — X501XXA Overexertion from prolonged static or awkward postures, initial encounter: Secondary | ICD-10-CM | POA: Diagnosis not present

## 2020-02-24 DIAGNOSIS — S99912A Unspecified injury of left ankle, initial encounter: Secondary | ICD-10-CM | POA: Diagnosis not present

## 2020-02-24 DIAGNOSIS — Z79899 Other long term (current) drug therapy: Secondary | ICD-10-CM | POA: Insufficient documentation

## 2020-02-24 DIAGNOSIS — Y92481 Parking lot as the place of occurrence of the external cause: Secondary | ICD-10-CM | POA: Diagnosis not present

## 2020-02-24 DIAGNOSIS — I10 Essential (primary) hypertension: Secondary | ICD-10-CM | POA: Insufficient documentation

## 2020-02-24 DIAGNOSIS — Z7984 Long term (current) use of oral hypoglycemic drugs: Secondary | ICD-10-CM | POA: Diagnosis not present

## 2020-02-24 DIAGNOSIS — E1110 Type 2 diabetes mellitus with ketoacidosis without coma: Secondary | ICD-10-CM | POA: Diagnosis not present
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
## 2020-02-24 NOTE — Discharge Instructions (Signed)
Please read and follow all provided instructions.  Your diagnoses today include:  1. Injury of left ankle, initial encounter     Tests performed today include:  An x-ray of your ankle - does NOT show any broken bones  Vital signs. See below for your results today.   Medications prescribed:  Please use over-the-counter NSAID medications (ibuprofen, naproxen) as directed on the packaging for pain if you do not have any reasons not to take these medications just as weak kidneys or a history of bleeding in your stomach or gut.   Take any prescribed medications only as directed.  Home care instructions:   Follow any educational materials contained in this packet  Follow R.I.C.E. Protocol:  R - rest your injury   I  - use ice on injury without applying directly to skin  C - compress injury with bandage or splint  E - elevate the injury as much as possible  Follow-up instructions: Please follow-up with your primary care provider or the provided orthopedic (bone specialist) if you continue to have significant pain or trouble walking in 1 week. In this case you may have a severe sprain that requires further care.   Return instructions:   Please return if your toes are numb or tingling, appear gray or blue, or you have severe pain (also elevate leg and loosen splint or wrap)  Please return to the Emergency Department if you experience worsening symptoms.   Please return if you have any other emergent concerns.  Additional Information:  Your vital signs today were: BP (!) 164/99 (BP Location: Right Arm)   Pulse 86   Temp 98.7 F (37.1 C) (Oral)   Resp 17   Ht 6\' 4"  (1.93 m)   Wt 100.2 kg   SpO2 99%   BMI 26.90 kg/m  If your blood pressure (BP) was elevated above 135/85 this visit, please have this repeated by your doctor within one month. -------------- Your caregiver has diagnosed you as suffering from an ankle sprain. Ankle sprain occurs when the ligaments that hold  the ankle joint together are stretched or torn. It may take 4 to 6 weeks to heal.  For Activity: If prescribed crutches, use crutches with non-weight bearing for the first few days. Then, you may walk on your ankle as the pain allows, or as instructed. Start gradually with weight bearing on the affected ankle. Once you can walk pain free, then try jogging. When you can run forwards, then you can try moving side-to-side. If you cannot walk without crutches in one week, you need a re-check. --------------
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
## 2020-02-26 ENCOUNTER — Encounter: Payer: Self-pay | Admitting: Orthopaedic Surgery

## 2020-02-26 ENCOUNTER — Ambulatory Visit (INDEPENDENT_AMBULATORY_CARE_PROVIDER_SITE_OTHER): Payer: Medicaid Other | Admitting: Orthopaedic Surgery

## 2020-02-26 DIAGNOSIS — M25572 Pain in left ankle and joints of left foot: Secondary | ICD-10-CM
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
## 2020-09-23 ENCOUNTER — Other Ambulatory Visit: Payer: Self-pay | Admitting: Podiatry

## 2020-09-23 DIAGNOSIS — B353 Tinea pedis: Secondary | ICD-10-CM

## 2020-09-28 ENCOUNTER — Ambulatory Visit: Payer: Medicaid Other | Admitting: Podiatry

## 2020-10-30 ENCOUNTER — Other Ambulatory Visit: Payer: Self-pay

## 2020-10-30 ENCOUNTER — Ambulatory Visit: Payer: Medicaid Other | Admitting: Podiatry

## 2020-10-30 ENCOUNTER — Encounter: Payer: Self-pay | Admitting: Podiatry

## 2020-10-30 DIAGNOSIS — M79674 Pain in right toe(s): Secondary | ICD-10-CM | POA: Diagnosis not present

## 2020-10-30 DIAGNOSIS — B353 Tinea pedis: Secondary | ICD-10-CM | POA: Diagnosis not present

## 2020-10-30 DIAGNOSIS — M79675 Pain in left toe(s): Secondary | ICD-10-CM | POA: Diagnosis not present

## 2020-10-30 DIAGNOSIS — B351 Tinea unguium: Secondary | ICD-10-CM | POA: Diagnosis not present

## 2020-10-30 DIAGNOSIS — E1165 Type 2 diabetes mellitus with hyperglycemia: Secondary | ICD-10-CM

## 2020-10-30 MED ORDER — CLOTRIMAZOLE 1 % EX CREA: TOPICAL_CREAM | CUTANEOUS | 1 refills | Status: DC

## 2020-10-30 NOTE — Progress Notes (Signed)
  Subjective:  Patient ID: Bob Reyes, male    DOB: 05/29/1980,  MRN: [id number]  40 y.o. male presents preventative diabetic foot care and painful thick toenails that are difficult to trim. Pain interferes with ambulation. Aggravating factors include wearing enclosed shoe gear. Pain is relieved with periodic professional debridement.  Patient states blood glucose was 98 mg/dl on yesterday; he did not check it this morning.  PCP is Rometta Emery, MD , and last visit was two months ago.  No Known Allergies  Review of Systems: Negative except as noted in the HPI.   Objective:  Vascular Examination: Vascular status intact b/l with palpable pedal pulses. CFT immediate b/l. No edema. No pain with calf compression b/l. Skin temperature gradient WNL b/l.   Neurological Examination: Sensation grossly intact b/l with 10 gram monofilament. Vibratory sensation intact b/l.   Dermatological Examination: Pedal skin with normal turgor, texture and tone b/l. Toenails 1-5 b/l thick, discolored, elongated with subungual debris and pain on dorsal palpation. Hyperkeratotic lesion(s) plantar aspect b/l heel pads.  No erythema, no edema, no drainage, no fluctuance.  Musculoskeletal Examination: Muscle strength 5/5 to b/l LE. No gross bony deformities b/l.  Radiographs: None Assessment:   1. Pain due to onychomycosis of toenails of both feet   2. Tinea pedis of both feet   3. Type 2 diabetes mellitus with hyperglycemia, without long-term current use of insulin (HCC)     Plan:  -Examined patient. -Patient refuses paring of calluses on today's visit. Refuses to sign Medicaid ABN. Advised patient to use pumice sto. -Continue diabetic foot care principles: inspect feet daily, monitor glucose as recommended by PCP and/or Endocrinologist, and follow prescribed diet per PCP, Endocrinologist and/or dietician. -Patient to continue soft, supportive shoe gear daily. -Toenails 1-5 b/l were debrided in  length and girth with sterile nail nippers and dremel without iatrogenic bleeding.  -Patient to report any pedal injuries to medical professional immediately. --For tinea pedis, Rx sent to pharmacy for Clotrimazole Cream 1% to be applied toleft foot and right foot twice daily for 6 weeks. -Patient/POA to call should there be question/concern in the interim.  Return in about 3 months (around 01/29/2021).  Freddie Breech, DPM

## 2020-11-11 ENCOUNTER — Other Ambulatory Visit: Payer: Self-pay

## 2020-11-11 ENCOUNTER — Ambulatory Visit (HOSPITAL_COMMUNITY)
Admission: EM | Admit: 2020-11-11 | Discharge: 2020-11-11 | Disposition: A | Discharge status: Home / Self Care | Payer: Medicaid Other | Attending: Psychiatry | Admitting: Psychiatry

## 2020-11-11 DIAGNOSIS — F200 Paranoid schizophrenia: Secondary | ICD-10-CM | POA: Insufficient documentation

## 2020-11-11 NOTE — ED Provider Notes (Signed)
Behavioral Health Urgent Care Medical Screening Exam  Patient Name: Bob Reyes MRN: [id number] Date of Evaluation: 11/11/20 Chief Complaint:   Diagnosis:  Final diagnoses:  Paranoid schizophrenia, chronic condition ([place])    History of Present illness: Bob Reyes is a 40 y.o. male. Patient states "I need to get my medications."  Patient presents voluntarily to [facility] behavioral health for walk-in assessment. He has been diagnosed with schizophrenia.  He reports he is followed outpatient by [facility] in [place].  He last met with outpatient therapist at [facility] approximately 1 week ago.  Additionally he meets with therapist at [place] each Thursday.  He reports good compliance with medications, reports he last had medications today.  Patient is assessed face-to-face by nurse practitioner.  He is seated in assessment area, no acute distress.  He is alert and oriented, pleasant and cooperative during assessment.  He reports euthymic mood with congruent affect.  He denies suicidal and homicidal ideations.  He denies any history of suicide attempts, denies history of self-harm.  He contracts verbally for safety with this Probation officer.  He has normal speech and behavior.  He denies both auditory and visual hallucinations.  Patient is able to converse coherently with goal-directed thoughts and no distractibility or preoccupation.  He denies paranoia.  Objectively there is no evidence of psychosis/mania or delusional thinking.  Patient resides in [place] with his mother, denies access to weapons. He is seeking employment in the [place]. He denies alcohol and substance use. He endorses alcohol and substance use.   Patient offered support and encouragement.  He gives verbal consent to speak with his mother, Bob Reyes, who reports concern that patient may have recently been "flushing his medications down the toilet." She reports she would like to have him placed back on  long-acting injectable, he has tolerated these medications well in the past.  She agrees with plan to follow-up with established outpatient psychiatry at [place], [place].   Psychiatric Specialty Exam  Presentation  General Appearance:Appropriate for Environment; Casual  Eye Contact:Fair  Speech:Clear and Coherent; Normal Rate  Speech Volume:Normal  Handedness:Right   Mood and Affect  Mood:Euthymic  Affect:Appropriate; Congruent   Thought Process  Thought Processes:Coherent; Goal Directed; Linear  Descriptions of Associations:Intact  Orientation:Full (Time, Place and Person)  Thought Content:WDL; Logical    Hallucinations:None  Ideas of Reference:None  Suicidal Thoughts:No  Homicidal Thoughts:No   Sensorium  Memory:Immediate Fair; Recent Fair; Remote Fair  Judgment:Fair  Insight:Shallow   Executive Functions  Concentration:Good  Attention Span:Good  Recall:Good  Fund of Knowledge:Good  Language:Good   Psychomotor Activity  Psychomotor Activity:Normal   Assets  Assets:Communication Skills; Desire for Improvement; Financial Resources/Insurance; Intimacy; Housing; Leisure Time; Physical Health   Sleep  Sleep:Good  Number of hours:  No data recorded  No data recorded  Physical Exam: Physical Exam Vitals and nursing note reviewed.  Constitutional:      Appearance: Normal appearance. He is well-developed and normal weight.  HENT:     Head: Normocephalic and atraumatic.     Nose: Nose normal.  Cardiovascular:     Rate and Rhythm: Normal rate.  Pulmonary:     Effort: Pulmonary effort is normal.  Musculoskeletal:        General: Normal range of motion.     Cervical back: Normal range of motion.  Skin:    General: Skin is warm and dry.  Neurological:     Mental Status: He is alert and oriented to person, place, and time.  Psychiatric:        Attention and Perception: Attention and perception normal.        Mood and Affect: Mood  and affect normal.        Speech: Speech normal.        Behavior: Behavior normal. Behavior is cooperative.        Thought Content: Thought content normal.        Cognition and Memory: Cognition and memory normal.        Judgment: Judgment normal.   Review of Systems  Constitutional: Negative.   HENT: Negative.    Eyes: Negative.   Respiratory: Negative.    Cardiovascular: Negative.   Gastrointestinal: Negative.   Genitourinary: Negative.   Musculoskeletal: Negative.   Skin: Negative.   Neurological: Negative.   Endo/Heme/Allergies: Negative.   Psychiatric/Behavioral: Negative.    Blood pressure (!) 150/89, pulse 72, temperature 97.6 F (36.4 C), temperature source Oral, resp. rate 18, SpO2 100 %. There is no height or weight on file to calculate BMI.  Musculoskeletal: Strength & Muscle Tone: within normal limits Gait & Station: normal Patient leans: N/A   [place] MSE Discharge Disposition for Follow up and Recommendations: Based on my evaluation the patient does not appear to have an emergency medical condition and can be discharged with resources and follow up care in outpatient services for Medication Management and Individual Therapy Patient reviewed with Dr Serafina Mitchell. Follow up with established outpatient psychiatry at [facility]. Continue current medications.    Lucky Rathke, FNP 11/11/2020, 12:09 PM

## 2020-11-11 NOTE — Discharge Instructions (Signed)

## 2020-11-11 NOTE — Discharge Summary (Signed)
Bob Reyes to be D/C'd Home per FNP order. Discussed with the patient and all questions fully answered. An After Visit Summary was printed and given to the patient. Patient escorted out and D/C home via private auto.  [name]  11/11/2020 12:18 PM

## 2020-11-11 NOTE — BH Assessment (Signed)
Pt reports needing to get his medicaitons adjusted. Reports he is compliant with taking zyprexa but want to get medications increased so "I can see better". Denies SI, HI, AVH, paranoia and substance use. Pt reports seeing a provider at [facility] medical center for med mgt about three months ago. Pt reports diagnosis of schizophrenia.  Pt mom is with him today if collateral is needed .   Pt is routine based off information received in triage.

## 2020-11-19 ENCOUNTER — Telehealth (HOSPITAL_COMMUNITY): Payer: Self-pay | Admitting: Internal Medicine

## 2020-11-19 NOTE — BH Assessment (Signed)
Care Management - Follow Up [facility] Discharges   Writer attempted to make contact with patient today and was unsuccessful.  Writer was able to leave a HIPPA compliant voice message.     Per chart review, patient receives outpatient mental health services with [facility].

## 2020-12-07 ENCOUNTER — Ambulatory Visit: Payer: Medicaid Other | Admitting: Podiatry

## 2020-12-11 ENCOUNTER — Ambulatory Visit (HOSPITAL_COMMUNITY)
Admission: RE | Admit: 2020-12-11 | Discharge: 2020-12-11 | Disposition: A | Discharge status: Home / Self Care | Payer: Medicaid Other | Attending: Psychiatry | Admitting: Psychiatry

## 2021-02-01 ENCOUNTER — Ambulatory Visit: Payer: Medicaid Other | Admitting: Podiatry

## 2021-04-17 ENCOUNTER — Ambulatory Visit (HOSPITAL_COMMUNITY)
Admission: EM | Admit: 2021-04-17 | Discharge: 2021-04-17 | Disposition: A | Discharge status: Home / Self Care | Payer: Medicaid Other | Attending: Psychiatry | Admitting: Psychiatry

## 2021-04-17 DIAGNOSIS — Z79899 Other long term (current) drug therapy: Secondary | ICD-10-CM | POA: Insufficient documentation

## 2021-04-17 DIAGNOSIS — F200 Paranoid schizophrenia: Secondary | ICD-10-CM | POA: Insufficient documentation

## 2021-04-17 DIAGNOSIS — E119 Type 2 diabetes mellitus without complications: Secondary | ICD-10-CM | POA: Insufficient documentation

## 2021-04-17 NOTE — ED Notes (Signed)
Discharge instructions provided and Pt stated understanding. Pt alert, orient and ambulatory prior to d/c from facility. Personal belongings returned. Pt escorted to the front lobby to d/c from facility. Safety maintained.   

## 2021-04-17 NOTE — ED Triage Notes (Signed)
Pt presents to [facility] accompanied by his mother/family. Pt states that he is currently homeless and came to this facility with his family seeking housing. Pt states that he is diagnosed with schizophrenia but he is compliant with his medications. Pt states " I'm good, I'm just stressed because I don't have anywhere to go". Pt denies SI/HI and AVH. ?

## 2021-04-17 NOTE — Discharge Instructions (Addendum)

## 2021-04-17 NOTE — ED Provider Notes (Signed)
Behavioral Health Urgent Care Medical Screening Exam ? ?Patient Name: Bob Reyes ?MRN: [id number] ?Date of Evaluation: 04/17/21 ?Chief Complaint:   ?Diagnosis:  ?Final diagnoses:  ?Schizophrenia, paranoid (HCC)  ? ? ?History of Present illness: Bob Reyes is a 41 y.o. male. Patient presents voluntarily to [facility] behavioral health for walk-in assessment.  Patient is accompanied by his mother, Christin Bach and his aunt, Opal Sidles. Mother and aunt remain present during assessment per patient preference. ? ?Nashawn states "I am stressed because I do not have a place to stay."  Patient reports he has resided in a hotel for the last 3 days however he cannot continue to reside in a hotel as he does not have money. ? ?Patient has been diagnosed with paranoid schizophrenia.  He is followed by outpatient psychiatry for therapy and medication management at [facility] in [place].  He reports compliance with medications including Haldol, and medications to address his blood pressure and diabetes .  He was most recently seen by outpatient medication management at [facility] on 04/13/2021.  He reports compliance with Haldol.  Patient and his mother share that they are currently seeking housing for patient along with [facility] care coordinator.  Patient meets with [facility] care coordinator weekly every Thursday.  Patient's mother shares that patient has a IT consultant and is currently on a wait list for [facility] but there are no openings at this time.  Finnlee reports he has been hospitalized at [facility] in [place] in 2010.  No family mental health history reported. ? ?Patient is assessed face-to-face by nurse practitioner.  He is seated in assessment area, no acute distress.  He is alert and oriented, pleasant and cooperative during assessment.  ?He presents with euthymic mood, congruent affect. He denies suicidal and homicidal ideations.  He denies history of suicide attempts, denies history  of self-harm.  He contracts verbally for safety with this Clinical research associate. ?He has normal speech and behavior.  He denies both auditory and visual hallucinations.  Patient is able to converse coherently with goal-directed thoughts and no distractibility or preoccupation.  He denies paranoia.  Objectively there is no evidence of psychosis/mania or delusional thinking. ? ?Patient most recently residing in a hotel.  He denies access to weapons.  He is not currently employed.  He denies alcohol and substance use.  Patient endorses average sleep and appetite. ? ?Patient offered support and encouragement. ?Spoke with patient's mother who would like for patient to be "observed for a few days."  Patient's mother reports concern that current medication management provider at [facility] "is not doing anything."  Discussed potential alternative outpatient psychiatry providers. ? ?Patient's mother is educated and verbalizes understanding of mental health resources and other crisis services in the community. She is instructed to call 911 and present to the nearest emergency room should he experience any suicidal/homicidal ideation, auditory/visual/hallucinations, or detrimental worsening of his mental health condition.   ? ? ? ?Psychiatric Specialty Exam ? ?Presentation  ?General Appearance:Appropriate for Environment; Casual ? ?Eye Contact:Fair ? ?Speech:Clear and Coherent; Normal Rate ? ?Speech Volume:Normal ? ?Handedness:Right ? ? ?Mood and Affect  ?Mood:Euthymic ? ?Affect:Appropriate; Congruent ? ? ?Thought Process  ?Thought Processes:Coherent; Goal Directed; Linear ? ?Descriptions of Associations:Intact ? ?Orientation:Full (Time, Place and Person) ? ?Thought Content:Logical; WDL ?   Hallucinations:None ? ?Ideas of Reference:None ? ?Suicidal Thoughts:No ? ?Homicidal Thoughts:No ? ? ?Sensorium  ?Memory:Immediate Good; Recent Fair ? ?Judgment:Intact ? ?Insight:Present ? ? ?Executive Functions  ?Concentration:Good ? ?Attention  Span:Good ? ?  Recall:Good ? ?Fund of Knowledge:Good ? ?Language:Good ? ? ?Psychomotor Activity  ?Psychomotor Activity:Normal ? ? ?Assets  ?Assets:Communication Skills; Desire for Improvement; Financial Resources/Insurance; Intimacy; Physical Health; Leisure Time; Resilience; Social Support ? ? ?Sleep  ?Sleep:Fair ? ?Number of hours: No data recorded ? ?No data recorded ? ?Physical Exam: ?Physical Exam ?Vitals and nursing note reviewed.  ?Constitutional:   ?   Appearance: Normal appearance. He is well-developed.  ?HENT:  ?   Head: Normocephalic.  ?   Nose: Nose normal.  ?Cardiovascular:  ?   Rate and Rhythm: Normal rate.  ?Pulmonary:  ?   Effort: Pulmonary effort is normal.  ?Musculoskeletal:     ?   General: Normal range of motion.  ?Skin: ?   General: Skin is warm and dry.  ?Neurological:  ?   Mental Status: He is alert and oriented to person, place, and time.  ?Psychiatric:     ?   Attention and Perception: Attention and perception normal.     ?   Mood and Affect: Mood and affect normal.     ?   Speech: Speech normal.     ?   Behavior: Behavior normal. Behavior is cooperative.     ?   Thought Content: Thought content normal.     ?   Cognition and Memory: Cognition and memory normal.  ? ?Review of Systems  ?Constitutional: Negative.   ?HENT: Negative.    ?Eyes: Negative.   ?Respiratory: Negative.    ?Cardiovascular: Negative.   ?Gastrointestinal: Negative.   ?Genitourinary: Negative.   ?Musculoskeletal: Negative.   ?Skin: Negative.   ?Neurological: Negative.   ?Endo/Heme/Allergies: Negative.   ?Psychiatric/Behavioral: Negative.    ?Blood pressure (!) 144/88, pulse 98, temperature 98.3 ?F (36.8 ?C), temperature source Oral, resp. rate 18, SpO2 98 %. There is no height or weight on file to calculate BMI. ? ?Musculoskeletal: ?Strength & Muscle Tone: within normal limits ?Gait & Station: normal ?Patient leans: N/A ? ? ?[facility] MSE Discharge Disposition for Follow up and Recommendations: ?Based on my evaluation I certify  that psychiatric inpatient services furnished can reasonably be expected to improve the patient's condition which I recommend transfer to an appropriate accepting facility.  ?Patient reviewed with Dr. Nelly Rout. ?Follow-up with established outpatient psychiatry or alternative outpatient psychiatry resources. ?Continue current medications. ? ?Lenard Lance, FNP ?04/17/2021, 2:35 PM ? ?

## 2021-04-21 ENCOUNTER — Ambulatory Visit (INDEPENDENT_AMBULATORY_CARE_PROVIDER_SITE_OTHER): Payer: Medicaid Other | Admitting: Podiatry

## 2021-04-21 ENCOUNTER — Encounter: Payer: Self-pay | Admitting: Podiatry

## 2021-04-21 ENCOUNTER — Other Ambulatory Visit: Payer: Self-pay

## 2021-04-21 DIAGNOSIS — M79675 Pain in left toe(s): Secondary | ICD-10-CM

## 2021-04-21 DIAGNOSIS — B351 Tinea unguium: Secondary | ICD-10-CM

## 2021-04-21 DIAGNOSIS — E119 Type 2 diabetes mellitus without complications: Secondary | ICD-10-CM

## 2021-04-21 DIAGNOSIS — E1165 Type 2 diabetes mellitus with hyperglycemia: Secondary | ICD-10-CM

## 2021-04-21 DIAGNOSIS — M79674 Pain in right toe(s): Secondary | ICD-10-CM

## 2021-04-22 ENCOUNTER — Ambulatory Visit (INDEPENDENT_AMBULATORY_CARE_PROVIDER_SITE_OTHER): Payer: Medicaid Other | Admitting: Physician Assistant

## 2021-04-22 ENCOUNTER — Encounter (HOSPITAL_COMMUNITY): Payer: Self-pay | Admitting: Physician Assistant

## 2021-04-22 VITALS — BP 143/99 | HR 69 | Ht 77.0 in | Wt 200.0 lb

## 2021-04-22 DIAGNOSIS — F200 Paranoid schizophrenia: Secondary | ICD-10-CM

## 2021-04-22 MED ORDER — HALOPERIDOL 5 MG PO TABS: 5.0000 mg | ORAL_TABLET | Freq: Every day | ORAL | 1 refills | Status: DC

## 2021-04-22 NOTE — Progress Notes (Cosign Needed)
Psychiatric Initial Adult Assessment   Patient Identification: Bob Reyes MRN:  [id number] Date of Evaluation:  04/22/2021 Referral Source: [facility]al [facility] Urgent [facility] Chief Complaint:   Chief Complaint  Patient presents with   WALK-IN    WALK IN POST [facility] D/C   Visit Diagnosis:    ICD-10-CM   1. Paranoid schizophrenia, chronic condition (HCC)  F20.0 haloperidol (HALDOL) 5 MG tablet      History of Present Illness:    Bob Reyes  Associated Signs/Symptoms: Depression Symptoms:  insomnia, psychomotor retardation, recurrent thoughts of death, anxiety, disturbed sleep, weight loss, decreased labido, (Hypo) Manic Symptoms:  Elevated Mood, Financial Extravagance, Grandiosity, Anxiety Symptoms:   None Psychotic Symptoms:  Paranoia, PTSD Symptoms: Had a traumatic exposure:  Patient denies traumatic experiences Had a traumatic exposure in the last month:  N/A Re-experiencing:  Intrusive Thoughts Hypervigilance:  No Hyperarousal:  None Avoidance:  Foreshortened Future  Past Psychiatric History:  Paranoid schizophrenia chronic condition  Previous Psychotropic Medications: Yes   Substance Abuse History in the last 12 months:  No.  Consequences of Substance Abuse: Medical Consequences:  None Legal Consequences:  None Family Consequences:  None Blackouts:  None DT's: None Withdrawal Symptoms:   None  Past Medical History:  Past Medical History:  Diagnosis Date   Closed fracture of bone of left foot    Closed fracture of bone of right foot    Constipation due to opioid therapy 07/05/2016   Diabetes mellitus without complication ([place])    Hypertension    Paranoid schizophrenia, chronic condition ([place]) 06/28/2016    Past Surgical History:  Procedure Laterality Date   ORIF CALCANEOUS FRACTURE Left 06/29/2016   Procedure: OPEN REDUCTION INTERNAL FIXATION (ORIF) CALCANEOUS FRACTURE;  Surgeon: Marybelle Killings, MD;  Location: [place];  Service: Orthopedics;   Laterality: Left;   ORIF CALCANEOUS FRACTURE Right 07/01/2016   Procedure: OPEN REDUCTION INTERNAL FIXATION (ORIF) RIGHT CALCANEOUS FRACTURE;  Surgeon: Marybelle Killings, MD;  Location: [place];  Service: Orthopedics;  Laterality: Right;    Family Psychiatric History:  Patient states that many of his Aunts on his mother's side of the family have schizophrenia.  Family History:  Family History  Problem Relation Age of Onset   Healthy Mother    Healthy Father     Social History:   Social History   Socioeconomic History   Marital status: Single    Spouse name: Not on file   Number of children: Not on file   Years of education: Not on file   Highest education level: Not on file  Occupational History   Not on file  Tobacco Use   Smoking status: Never   Smokeless tobacco: Never  Vaping Use   Vaping Use: Never used  Substance and Sexual Activity   Alcohol use: No   Drug use: No   Sexual activity: Not on file  Other Topics Concern   Not on file  Social History Narrative   Not on file   Social Determinants of Health   Financial Resource Strain: Not on file  Food Insecurity: Not on file  Transportation Needs: Not on file  Physical Activity: Not on file  Stress: Not on file  Social Connections: Not on file    Additional Social History:  Patient reports that he doesn't have housing but is living with his mother. Patient  Is currently unemployed but receives disability.  Patient endorses social support and states that he receives therapy through [facility].  Allergies:  No Known Allergies  Metabolic Disorder Labs: Lab Results  Component Value Date   HGBA1C 11.7 (H) 11/03/2019   MPG 289.09 11/03/2019   Lab Results  Component Value Date   PROLACTIN 23.5 (H) 06/29/2016   Lab Results  Component Value Date   CHOL 159 06/29/2016   TRIG 225 (H) 11/06/2019   HDL 33 (L) 06/29/2016   CHOLHDL 4.8 06/29/2016   VLDL 36 06/29/2016   LDLCALC 90 06/29/2016   Lab Results   Component Value Date   TSH 1.959 Test methodology is 3rd generation TSH 04/28/2007    Therapeutic Level Labs: No results found for: LITHIUM No results found for: CBMZ No results found for: VALPROATE  Current Medications: Current Outpatient Medications  Medication Sig Dispense Refill   amLODipine (NORVASC) 5 MG tablet Take 1 tablet (5 mg total) by mouth daily. 30 tablet 3   blood glucose meter kit and supplies Dispense based on patient and insurance preference. Use up to four times daily as directed. (FOR ICD-10 E10.9, E11.9). 1 each 0   clotrimazole (LOTRIMIN) 1 % cream Apply to both feet and between toes twice daily for 6 weeks. 60 g 1   glimepiride (AMARYL) 2 MG tablet Take 2 mg by mouth daily.     insulin degludec (TRESIBA) 100 UNIT/ML FlexTouch Pen Inject 45 Units into the skin in the morning. 15 mL 3   Insulin Pen Needle 32G X 4 MM MISC 45 Units by Does not apply route in the morning. 100 each 2   ketoconazole (NIZORAL) 2 % cream Apply 1 application topically 2 (two) times daily as needed (facial blemishes).      LANTUS SOLOSTAR 100 UNIT/ML Solostar Pen SMARTSIG:45 Unit(s) SUB-Q Every Morning     linagliptin (TRADJENTA) 5 MG TABS tablet Take 1 tablet (5 mg total) by mouth daily. 30 tablet 3   haloperidol (HALDOL) 5 MG tablet Take 1 tablet (5 mg total) by mouth at bedtime. 30 tablet 1   No current facility-administered medications for this visit.    Musculoskeletal: Strength & Muscle Tone: within normal limits Gait & Station: normal Patient leans: N/A  Psychiatric Specialty Exam: Review of Systems  Psychiatric/Behavioral:  Positive for behavioral problems. Negative for decreased concentration, dysphoric mood, hallucinations, self-injury, sleep disturbance and suicidal ideas. The patient is not nervous/anxious and is not hyperactive.    Blood pressure (!) 143/99, pulse 69, height 6\' 5"  (1.956 m), weight 200 lb (90.7 kg).Body mass index is 23.72 kg/m.  General Appearance:  Casual  Eye Contact:  Good  Speech:  Clear and Coherent and Normal Rate  Volume:  Normal  Mood:  Euthymic  Affect:  Appropriate  Thought Process:  Coherent, Goal Directed, and Descriptions of Associations: Intact  Orientation:  Full (Time, Place, and Person)  Thought Content:  WDL  Suicidal Thoughts:  No  Homicidal Thoughts:  No  Memory:  Immediate;   Fair Recent;   Fair Remote;   Fair  Judgement:  Fair  Insight:  Fair  Psychomotor Activity:  Normal  Concentration:  Concentration: Good and Attention Span: Good  Recall:  Good  Fund of Knowledge:Fair  Language: Good  Akathisia:  No  Handed:  Right  AIMS (if indicated):  not done  Assets:  Communication Skills Desire for Improvement Financial Resources/Insurance  ADL's:  Intact  Cognition: WNL  Sleep:  Good   Screenings: GAD-7    Flowsheet Row Office Visit from 04/22/2021 in [facility]  Total GAD-7 Score 12  [place] Office Visit from 04/22/2021 in [facility] Nutrition from 11/12/2019 in [facility]  PHQ-2 Total Score 2 0  PHQ-9 Total Score 10 --      [place] Office Visit from 04/22/2021 in [place] and Plan:     Collaboration of Care: Medication Management AEB provider managing patient's psychiatric medications, Primary Care Provider AEB patient having a primary care provider at [facility], [place] patient being followed by a mental health provider at this facility, and Referral or follow-up with counselor/therapist AEB patient being followed by a therapist/counselor at [place] was advised Release of Information must be obtained prior to any record release in order to collaborate their care with an outside provider. Patient/Guardian was advised if they have not already done so to  contact the registration department to sign all necessary forms in order for [place] to release information regarding their care.   Consent: Patient/Guardian gives verbal consent for treatment and assignment of benefits for services provided during this visit. Patient/Guardian expressed understanding and agreed to proceed.   1. Paranoid schizophrenia, chronic condition (HCC)  - haloperidol (HALDOL) 5 MG tablet; Take 1 tablet (5 mg total) by mouth at bedtime.  Dispense: 30 tablet; Refill: 1  To follow up in 6 weeks Provider spent a total of 30 minutes with the patient/reviewing patient's chart  Malachy Mood, PA 3/9/20235:55 PM

## 2021-04-28 NOTE — Progress Notes (Signed)
ANNUAL DIABETIC FOOT EXAM ? ?Subjective: ?Bob Reyes presents today for annual diabetic foot examination. ? ?Patient disputes dx of diabetes, though medical records indicates the diagnosis. He states last A1c indicated "I am no longer diabetic"he states he no longer has to check his blood glucose. ? ?Patient denies any h/o foot wounds. ? ?Patient denies any numbness, tingling, burning, or pins/needle sensation in feet. ? ?Risk factors: diabetes, HTN. ? ?Elwyn Reach, MD is patient's PCP. Last visit was December, 2022. ? ?Patient declines callus paring on today's visit stating he takes care of them himself. ? ?Past Medical History:  ?Diagnosis Date  ? Closed fracture of bone of left foot   ? Closed fracture of bone of right foot   ? Constipation due to opioid therapy 07/05/2016  ? Diabetes mellitus without complication ([place])   ? Hypertension   ? Paranoid schizophrenia, chronic condition ([place]) 06/28/2016  ? ?Patient Active Problem List  ? Diagnosis Date Noted  ? Pain in left ankle and joints of left foot 02/26/2020  ? DM type 2 ([place]) 11/05/2019  ? Renal insufficiency 11/05/2019  ? AKI (acute kidney injury) ([place]) 11/05/2019  ? LBBB (left bundle branch block) 11/05/2019  ? Elevated lipase 11/05/2019  ? DKA (diabetic ketoacidoses) 11/02/2019  ? Weakness 01/16/2017  ? Essential hypertension, benign 09/08/2016  ? S/P ORIF (open reduction internal fixation) fracture 08/19/2016  ? Constipation due to opioid therapy 07/05/2016  ? Closed fracture of bone of right foot   ? Closed fracture of bone of left foot   ? Paranoid schizophrenia, chronic condition ([place]) 06/28/2016  ? Bilateral calcaneal fractures 06/27/2016  ? ?Past Surgical History:  ?Procedure Laterality Date  ? ORIF CALCANEOUS FRACTURE Left 06/29/2016  ? Procedure: OPEN REDUCTION INTERNAL FIXATION (ORIF) CALCANEOUS FRACTURE;  Surgeon: Marybelle Killings, MD;  Location: [place];  Service: Orthopedics;  Laterality: Left;  ? ORIF CALCANEOUS FRACTURE Right 07/01/2016  ?  Procedure: OPEN REDUCTION INTERNAL FIXATION (ORIF) RIGHT CALCANEOUS FRACTURE;  Surgeon: Marybelle Killings, MD;  Location: [place];  Service: Orthopedics;  Laterality: Right;  ? ?Current Outpatient Medications on File Prior to Visit  ?Medication Sig Dispense Refill  ? amLODipine (NORVASC) 5 MG tablet Take 1 tablet (5 mg total) by mouth daily. 30 tablet 3  ? blood glucose meter kit and supplies Dispense based on patient and insurance preference. Use up to four times daily as directed. (FOR ICD-10 E10.9, E11.9). 1 each 0  ? clotrimazole (LOTRIMIN) 1 % cream Apply to both feet and between toes twice daily for 6 weeks. 60 g 1  ? glimepiride (AMARYL) 2 MG tablet Take 2 mg by mouth daily.    ? insulin degludec (TRESIBA) 100 UNIT/ML FlexTouch Pen Inject 45 Units into the skin in the morning. 15 mL 3  ? Insulin Pen Needle 32G X 4 MM MISC 45 Units by Does not apply route in the morning. 100 each 2  ? ketoconazole (NIZORAL) 2 % cream Apply 1 application topically 2 (two) times daily as needed (facial blemishes).     ? LANTUS SOLOSTAR 100 UNIT/ML Solostar Pen SMARTSIG:45 Unit(s) SUB-Q Every Morning    ? linagliptin (TRADJENTA) 5 MG TABS tablet Take 1 tablet (5 mg total) by mouth daily. 30 tablet 3  ? ?No current facility-administered medications on file prior to visit.  ?  ?No Known Allergies ?Social History  ? ?Occupational History  ? Not on file  ?Tobacco Use  ? Smoking status: Never  ? Smokeless tobacco: Never  ?  Vaping Use  ? Vaping Use: Never used  ?Substance and Sexual Activity  ? Alcohol use: No  ? Drug use: No  ? Sexual activity: Not on file  ? ?Family History  ?Problem Relation Age of Onset  ? Healthy Mother   ? Healthy Father   ? ?Immunization History  ?Administered Date(s) Administered  ? Influenza-Unspecified 09/14/2016  ? PPD Test 07/04/2016, 08/21/2016  ?  ? ?Review of Systems: Negative except as noted in the HPI.  ? ?Objective: ?There were no vitals filed for this visit. ? ?Bob Reyes is a pleasant 41 y.o. male in  NAD. AAO X 3. ? ?Vascular Examination: ?Vascular status intact b/l with palpable pedal pulses. CFT immediate b/l. No edema. No pain with calf compression b/l. Skin temperature gradient WNL b/l.  ? ?Neurological Examination: ?Sensation grossly intact b/l with 10 gram monofilament. Vibratory sensation intact b/l.  ? ?Dermatological Examination: ?Pedal skin with normal turgor, texture and tone b/l. Toenails 1-5 b/l thick, discolored, elongated with subungual debris and pain on dorsal palpation. Hyperkeratotic lesion(s) plantar aspect b/l heel pads.  No erythema, no edema, no drainage, no fluctuance. ? ?Musculoskeletal Examination: ?Muscle strength 5/5 to b/l LE. No gross bony deformities b/l. ? ?Radiographs: None ?Assessment: ?1. Pain due to onychomycosis of toenails of both feet   ?2. Type 2 diabetes mellitus with hyperglycemia, without long-term current use of insulin ([place])   ?3. Encounter for diabetic foot exam ([place])   ?  ?ADA Risk Categorization: ?Low Risk :  ?Patient has all of the following: ?Intact protective sensation ?No prior foot ulcer  ?No severe deformity ?Pedal pulses present ? ?Plan: ?-Examined patient. ?-Mycotic toenails 1-5 bilaterally were debrided in length and girth with sterile nail nippers and dremel without incident. ?-Patient/POA to call should there be question/concern in the interim. ?Return in about 4 months (around 08/21/2021). ? ?Marzetta Board, DPM ?

## 2021-05-10 ENCOUNTER — Telehealth (HOSPITAL_COMMUNITY): Payer: Self-pay | Admitting: Physician Assistant

## 2021-05-10 NOTE — Telephone Encounter (Signed)
Parent called to request changing patient from Haldol to Clozeril.   Also, when the change of medication occurs please inform ie the pt has to be home under supervision? Shaaron Adler, parent, [phone].

## 2021-05-27 ENCOUNTER — Other Ambulatory Visit (HOSPITAL_COMMUNITY): Payer: Self-pay | Admitting: Physician Assistant

## 2021-05-27 DIAGNOSIS — F200 Paranoid schizophrenia: Secondary | ICD-10-CM

## 2021-05-27 MED ORDER — RISPERIDONE 2 MG PO TABS: 2.0000 mg | ORAL_TABLET | Freq: Every day | ORAL | 0 refills | Status: DC

## 2021-05-27 NOTE — Telephone Encounter (Signed)
Message acknowledged and reviewed.

## 2021-05-27 NOTE — Progress Notes (Signed)
Patient presented today with his mother requesting placement with ACTT or facility that will help him.  Patient's mother is also requesting that patient be placed on a long-acting injectable due to patient exhibiting medication noncompliance in the past.  Per patient, patient has been on Zyprexa, risperidone, and Haldol in the past for his medication management.  He has been on the risperidone injectable in the past with success in the management of his symptoms according to the patient.  Provider recommended that patient be placed on risperidone 2 mg at bedtime before being placed on a long-acting injectable.  Provider recommended patient consider Invega injection which can be given 4 weeks as opposed to the risperidone Consta injection given every 2 weeks.  Provider placing patient on risperidone 2 mg at bedtime.  Patient has an appointment with this provider scheduled for 06/01/2021. ?

## 2021-06-01 ENCOUNTER — Ambulatory Visit (INDEPENDENT_AMBULATORY_CARE_PROVIDER_SITE_OTHER): Payer: Medicaid Other | Admitting: Physician Assistant

## 2021-06-01 ENCOUNTER — Encounter (HOSPITAL_COMMUNITY): Payer: Self-pay | Admitting: Physician Assistant

## 2021-06-01 ENCOUNTER — Telehealth (HOSPITAL_COMMUNITY): Payer: Self-pay | Admitting: *Deleted

## 2021-06-01 DIAGNOSIS — F200 Paranoid schizophrenia: Secondary | ICD-10-CM

## 2021-06-01 MED ORDER — INVEGA SUSTENNA 234 MG/1.5ML IM SUSY: 234.0000 mg | PREFILLED_SYRINGE | Freq: Once | INTRAMUSCULAR | 0 refills | Status: DC

## 2021-06-01 NOTE — Progress Notes (Signed)
BH MD/PA/NP OP Progress Note ? ?06/01/2021 6:22 PM ?Drue Stager Eng  ?MRN:  [id number] ? ?Chief Complaint:  ?Chief Complaint  ?Patient presents with  ? Follow-up  ? Medication Management  ? ?HPI:  ? ?Bob Reyes is a 41 year old male with a past psychiatric history significant for paranoid schizophrenia who presents to [facility] for follow-up and medication management.  Patient is currently being managed on the following medications: ? ?Haldol 5 mg at bedtime ?Risperidone 2 mg at bedtime ? ?Patient was recently placed on risperidone in preparation for receiving monthly injections of [place].  Patient reports that he has been taking risperidone as prescribed and loves it.  Patient endorsed some nausea when taking his risperidone the first day but has not had any issues since.  Patient reports that he has not been taking his Haldol and would like to know if he needs to continue taking the medication.  Provider advised the patient to continue taking Haldol 5 mg at bedtime.  Patient vocalized understanding.  Patient denies depressive symptoms.  He endorses anxiety and rates his anxiety at 6 out of 10.  Patient states that he has also been having issues with sleeping.  Patient currently has housing but is unemployed.  Before becoming unemployed, patient states that he used to work at [employer].  A GAD-7 screen was performed with the patient scoring a 7. ? ?Patient is alert and oriented x4, calm, cooperative, and fully engaged in conversation during the encounter.  Patient endorses good mood.  Patient denies suicidal or homicidal ideations.  He further denies auditory or visual hallucinations and does not appear to be responding to internal/external stimuli.  Patient endorses poor sleep and receives on average 4 hours of sleep each night.  Patient endorses good appetite and eats on average 2 meals per day.  Patient denies alcohol consumption, tobacco use, and illicit drug  use. ? ?Visit Diagnosis:  ?  ICD-10-CM   ?1. Paranoid schizophrenia, chronic condition ([place])  F20.0 paliperidone (INVEGA SUSTENNA) 234 MG/1.5ML SUSY injection  ?  ? ? ?Past Psychiatric History:  ?Paranoid schizophrenia, chronic condition ? ?Past Medical History:  ?Past Medical History:  ?Diagnosis Date  ? Closed fracture of bone of left foot   ? Closed fracture of bone of right foot   ? Constipation due to opioid therapy 07/05/2016  ? Diabetes mellitus without complication ([place])   ? Hypertension   ? Paranoid schizophrenia, chronic condition ([place]) 06/28/2016  ?  ?Past Surgical History:  ?Procedure Laterality Date  ? ORIF CALCANEOUS FRACTURE Left 06/29/2016  ? Procedure: OPEN REDUCTION INTERNAL FIXATION (ORIF) CALCANEOUS FRACTURE;  Surgeon: Marybelle Killings, MD;  Location: [place];  Service: Orthopedics;  Laterality: Left;  ? ORIF CALCANEOUS FRACTURE Right 07/01/2016  ? Procedure: OPEN REDUCTION INTERNAL FIXATION (ORIF) RIGHT CALCANEOUS FRACTURE;  Surgeon: Marybelle Killings, MD;  Location: [place];  Service: Orthopedics;  Laterality: Right;  ? ? ?Family Psychiatric History:  ?Patient states that many of his Aunts on his mother's side of the family have schizophrenia. ? ?Family History:  ?Family History  ?Problem Relation Age of Onset  ? Healthy Mother   ? Healthy Father   ? ? ?Social History:  ?Social History  ? ?Socioeconomic History  ? Marital status: Single  ?  Spouse name: Not on file  ? Number of children: Not on file  ? Years of education: Not on file  ? Highest education level: Not on file  ?Occupational History  ?  Not on file  ?Tobacco Use  ? Smoking status: Never  ? Smokeless tobacco: Never  ?Vaping Use  ? Vaping Use: Never used  ?Substance and Sexual Activity  ? Alcohol use: No  ? Drug use: No  ? Sexual activity: Not on file  ?Other Topics Concern  ? Not on file  ?Social History Narrative  ? Not on file  ? ?Social Determinants of Health  ? ?Financial Resource Strain: Not on file  ?Food Insecurity: Not on file   ?Transportation Needs: Not on file  ?Physical Activity: Not on file  ?Stress: Not on file  ?Social Connections: Not on file  ? ? ?Allergies: No Known Allergies ? ?Metabolic Disorder Labs: ?Lab Results  ?Component Value Date  ? HGBA1C 11.7 (H) 11/03/2019  ? MPG 289.09 11/03/2019  ? ?Lab Results  ?Component Value Date  ? PROLACTIN 23.5 (H) 06/29/2016  ? ?Lab Results  ?Component Value Date  ? CHOL 159 06/29/2016  ? TRIG 225 (H) 11/06/2019  ? HDL 33 (L) 06/29/2016  ? CHOLHDL 4.8 06/29/2016  ? VLDL 36 06/29/2016  ? Advance 90 06/29/2016  ? ?Lab Results  ?Component Value Date  ? TSH 1.959 Test methodology is 3rd generation TSH 04/28/2007  ? ? ?Therapeutic Level Labs: ?No results found for: LITHIUM ?No results found for: VALPROATE ?No components found for:  CBMZ ? ?Current Medications: ?Current Outpatient Medications  ?Medication Sig Dispense Refill  ? paliperidone (INVEGA SUSTENNA) 234 MG/1.5ML SUSY injection Inject 234 mg into the muscle once for 1 dose. 1.5 mL 0  ? amLODipine (NORVASC) 5 MG tablet Take 1 tablet (5 mg total) by mouth daily. 30 tablet 3  ? blood glucose meter kit and supplies Dispense based on patient and insurance preference. Use up to four times daily as directed. (FOR ICD-10 E10.9, E11.9). 1 each 0  ? clotrimazole (LOTRIMIN) 1 % cream Apply to both feet and between toes twice daily for 6 weeks. 60 g 1  ? glimepiride (AMARYL) 2 MG tablet Take 2 mg by mouth daily.    ? haloperidol (HALDOL) 5 MG tablet Take 1 tablet (5 mg total) by mouth at bedtime. 30 tablet 1  ? insulin degludec (TRESIBA) 100 UNIT/ML FlexTouch Pen Inject 45 Units into the skin in the morning. 15 mL 3  ? Insulin Pen Needle 32G X 4 MM MISC 45 Units by Does not apply route in the morning. 100 each 2  ? ketoconazole (NIZORAL) 2 % cream Apply 1 application topically 2 (two) times daily as needed (facial blemishes).     ? LANTUS SOLOSTAR 100 UNIT/ML Solostar Pen SMARTSIG:45 Unit(s) SUB-Q Every Morning    ? linagliptin (TRADJENTA) 5 MG TABS  tablet Take 1 tablet (5 mg total) by mouth daily. 30 tablet 3  ? risperiDONE (RISPERDAL) 2 MG tablet Take 1 tablet (2 mg total) by mouth at bedtime. 30 tablet 0  ? ?No current facility-administered medications for this visit.  ? ? ? ?Musculoskeletal: ?Strength & Muscle Tone: within normal limits ?Gait & Station: normal ?Patient leans: N/A ? ?Psychiatric Specialty Exam: ?Review of Systems  ?Psychiatric/Behavioral:  Positive for sleep disturbance. Negative for decreased concentration, dysphoric mood, hallucinations, self-injury and suicidal ideas. The patient is nervous/anxious. The patient is not hyperactive.    ?There were no vitals taken for this visit.There is no height or weight on file to calculate BMI.  ?General Appearance: Casual and Fairly Groomed  ?Eye Contact:  Good  ?Speech:  Clear and Coherent and Normal Rate  ?Volume:  Normal  ?Mood:  Anxious and Euthymic  ?Affect:  Appropriate and Congruent  ?Thought Process:  Coherent and Descriptions of Associations: Intact  ?Orientation:  Full (Time, Place, and Person)  ?Thought Content: WDL   ?Suicidal Thoughts:  No  ?Homicidal Thoughts:  No  ?Memory:  Immediate;   Fair ?Recent;   Fair ?Remote;   Fair  ?Judgement:  Fair  ?Insight:  Fair  ?Psychomotor Activity:  Normal  ?Concentration:  Concentration: Good and Attention Span: Good  ?Recall:  Good  ?Fund of Knowledge: Fair  ?Language: Good  ?Akathisia:  No  ?Handed:  Right  ?AIMS (if indicated): not done  ?Assets:  Communication Skills ?Desire for Improvement ?Financial Resources/Insurance ?Housing  ?ADL's:  Intact  ?Cognition: WNL  ?Sleep:  Fair  ? ?Screenings: ?GAD-7   ? ?[place] Office Visit from 06/01/2021 in [facility] Office Visit from 04/22/2021 in [facility]  ?Total GAD-7 Score 7 12  ? ?  ? ?PHQ2-9   ? ?[place] Office Visit from 06/01/2021 in [facility] Office Visit from 04/22/2021 in [facility] Nutrition from 11/12/2019 in [facility]  ?PHQ-2 Total Score 1 2 0  ?PHQ-9 Total Score -- 10 --  ? ?  ? ?[place] Office Visit from 06/01/2021 in [facility]

## 2021-06-01 NOTE — Telephone Encounter (Signed)
Called mom and patient after they left the office this am after seeing Eddie PA. I wanted to complete patient assistance form and and ACTT referral form. Mom states [facility] who is the agency I was referring him too is coming out tomorrow and he has mcd so no need to complete either form. Will notify Eddie to send both rxs for the Invega 234 and 156 to his preferred pharmacy so he can pick them up and bring them with him next thurs when his shot is due.  ?

## 2021-06-03 ENCOUNTER — Telehealth (HOSPITAL_COMMUNITY): Payer: Self-pay | Admitting: *Deleted

## 2021-06-03 NOTE — Telephone Encounter (Signed)
Call from patients mom asking If I was the person that called her the other day re referrals to an ACT. I had and when we spoke earlier in the week he has an appt with a team so she didn't want other information. States this am that the team that came out was [facility] and she said they did not work out because they were not [place] and did not have much to offer. I provided the name and numbers of the three teams I am aware of and she will follow up with them. ?

## 2021-06-10 ENCOUNTER — Ambulatory Visit (INDEPENDENT_AMBULATORY_CARE_PROVIDER_SITE_OTHER): Payer: Medicaid Other | Admitting: *Deleted

## 2021-06-10 ENCOUNTER — Encounter (HOSPITAL_COMMUNITY): Payer: Self-pay

## 2021-06-10 VITALS — BP 134/99 | HR 98 | Resp 16 | Ht 76.0 in | Wt 200.0 lb

## 2021-06-10 DIAGNOSIS — F200 Paranoid schizophrenia: Secondary | ICD-10-CM | POA: Diagnosis not present

## 2021-06-10 MED ORDER — PALIPERIDONE PALMITATE ER 234 MG/1.5ML IM SUSY
234.0000 mg | PREFILLED_SYRINGE | Freq: Once | INTRAMUSCULAR | Status: AC
  Administered 2021-06-10: 234 mg via INTRAMUSCULAR

## 2021-06-10 NOTE — Telephone Encounter (Signed)
Message acknowledged and reviewed.

## 2021-06-10 NOTE — Progress Notes (Signed)
In for his first shot today at this clinic. He was accompanied by his mom but she did not come back to the nurses area with him. He has MCD and did bring his shot. He got Invega  S 234 mg today as his first [place] shot. Going forward it will change to 156 mg. When asked he did not know what he took a shot for other than he could name his dx as paranoid schizophrenia. He denies any current sx when asked. He is pleasant and cooperative. Today he got his shot in his R DELTOID without issue. He has numerous tattoos on every skin surface that can be seen today so he said needles dont bother him. He is to return in one month for his next shot. ?

## 2021-07-05 ENCOUNTER — Other Ambulatory Visit (HOSPITAL_COMMUNITY): Payer: Self-pay | Admitting: Physician Assistant

## 2021-07-05 DIAGNOSIS — F200 Paranoid schizophrenia: Secondary | ICD-10-CM

## 2021-07-05 MED ORDER — PALIPERIDONE PALMITATE ER 156 MG/ML IM SUSY: 156.0000 mg | PREFILLED_SYRINGE | INTRAMUSCULAR | 11 refills | Status: DC

## 2021-07-05 NOTE — Progress Notes (Signed)
Patient to transition to [place] 156 mg injection every 28 days for the management of his paranoid schizophrenia. Patient's medication to be e-prescribed to pharmacy of choice.

## 2021-07-08 ENCOUNTER — Encounter (HOSPITAL_COMMUNITY): Payer: Self-pay

## 2021-07-08 ENCOUNTER — Telehealth (HOSPITAL_COMMUNITY): Payer: Self-pay | Admitting: *Deleted

## 2021-07-08 ENCOUNTER — Ambulatory Visit (HOSPITAL_COMMUNITY): Payer: Medicaid Other | Admitting: *Deleted

## 2021-07-08 VITALS — BP 139/93 | HR 77 | Ht 76.0 in | Wt 208.0 lb

## 2021-07-08 DIAGNOSIS — F200 Paranoid schizophrenia: Secondary | ICD-10-CM

## 2021-07-08 NOTE — Progress Notes (Signed)
In this am for his monthly injection of Invega S 156 mg. Today he got his shot in his L DELTOID. His mom works at 9 so anxious to be first on the day so she can get to work. Followed up with him on if he was now with an ACT, and he said he was, clarified he is going to [facility].. I will call them today to see if they can also take over giving him his shot. He states he is going 5 days a week. He is to return in one month for his next shot till I get clarity on his day program.

## 2021-07-08 NOTE — Telephone Encounter (Signed)
In for his monthly Invega injection. He told Clinical research associate today he is going to the [facility] and Clinical research associate called and spoke with Nedra Hai there to see if his injection is something they can take over going forward. They do not provide medication management services, they do not have medical staff and the NP that is contracted with them is there only for consult. Will continue seeing him for his injections.

## 2021-08-05 ENCOUNTER — Encounter (HOSPITAL_COMMUNITY): Payer: Self-pay

## 2021-08-05 ENCOUNTER — Ambulatory Visit (INDEPENDENT_AMBULATORY_CARE_PROVIDER_SITE_OTHER): Payer: Medicaid Other | Admitting: *Deleted

## 2021-08-05 ENCOUNTER — Other Ambulatory Visit (HOSPITAL_COMMUNITY): Payer: Self-pay | Admitting: Registered Nurse

## 2021-08-05 VITALS — BP 142/106 | HR 73 | Ht 76.0 in | Wt 210.0 lb

## 2021-08-05 DIAGNOSIS — F200 Paranoid schizophrenia: Secondary | ICD-10-CM

## 2021-08-05 MED ORDER — PALIPERIDONE PALMITATE ER 156 MG/ML IM SUSY
156.0000 mg | PREFILLED_SYRINGE | INTRAMUSCULAR | Status: AC
  Administered 2021-08-05 – 2021-11-04 (×3): 156 mg via INTRAMUSCULAR

## 2021-08-05 MED ORDER — PALIPERIDONE PALMITATE ER 156 MG/ML IM SUSY
156.0000 mg | PREFILLED_SYRINGE | INTRAMUSCULAR | 3 refills | Status: DC
Start: 2021-08-05 — End: 2021-09-30

## 2021-08-05 NOTE — Progress Notes (Signed)
In late today for his shot, scheduled this am in a 3 pm instead. States he was unable to pick up his shot before now, not at the pharmacy. He states he is trying to find a job, "maybe at the mall" Mom brought him to the appt but waiting for him in the car. He is pleasant, good personal appearance and denies any sx of psychosis. He got his Invega S 156 mg shot today in his R DELTOID without issue. He is to return in one month for his next shot.

## 2021-08-23 ENCOUNTER — Ambulatory Visit: Payer: Medicaid Other | Admitting: Podiatry

## 2021-08-23 ENCOUNTER — Encounter: Payer: Self-pay | Admitting: Podiatry

## 2021-08-23 DIAGNOSIS — M79674 Pain in right toe(s): Secondary | ICD-10-CM

## 2021-08-23 DIAGNOSIS — B353 Tinea pedis: Secondary | ICD-10-CM | POA: Diagnosis not present

## 2021-08-23 DIAGNOSIS — E1165 Type 2 diabetes mellitus with hyperglycemia: Secondary | ICD-10-CM

## 2021-08-23 DIAGNOSIS — M79675 Pain in left toe(s): Secondary | ICD-10-CM

## 2021-08-23 DIAGNOSIS — B351 Tinea unguium: Secondary | ICD-10-CM

## 2021-08-23 MED ORDER — CLOTRIMAZOLE 1 % EX CREA: TOPICAL_CREAM | CUTANEOUS | 1 refills | Status: AC

## 2021-08-23 NOTE — Progress Notes (Signed)
  Subjective:  Patient ID: Bob Reyes, male    DOB: [birth year],  MRN: [id number]  Bob Reyes presents to clinic today for painful elongated mycotic toenails 1-5 bilaterally which are tender when wearing enclosed shoe gear. Pain is relieved with periodic professional debridement.  Patient states states he is no longer diabetic.  New problem(s): None.   PCP is Rometta Emery, MD , and last visit was  4-5 months ago.  No Known Allergies  Review of Systems: Negative except as noted in the HPI.  Objective: No changes noted in today's physical examination. There were no vitals filed for this visit.  Bob Reyes is a pleasant 41 y.o. male in NAD. AAO X 3.  Vascular Examination: Vascular status intact b/l with palpable pedal pulses. CFT immediate b/l. No edema. No pain with calf compression b/l. Skin temperature gradient WNL b/l.   Neurological Examination: Sensation grossly intact b/l with 10 gram monofilament. Vibratory sensation intact b/l.   Dermatological Examination: Pedal skin with normal turgor, texture and tone b/l. Toenails 1-5 b/l thick, discolored, elongated with subungual debris and pain on dorsal palpation. Hyperkeratotic lesion(s) plantar aspect b/l heel pads.  No erythema, no edema, no drainage, no fluctuance. Diffuse scaling noted peripherally and plantarly b/l feet.  No interdigital macerations.  No blisters, no weeping. No signs of secondary bacterial infection noted.   Musculoskeletal Examination: Muscle strength 5/5 to b/l LE. No gross bony deformities b/l.  Radiographs: None  Assessment/Plan: 1. Pain due to onychomycosis of toenails of both feet   2. Tinea pedis of both feet     -Patient was evaluated and treated. All patient's and/or POA's questions/concerns answered on today's visit. -Patient to continue soft, supportive shoe gear daily. -Toenails 1-5 b/l were debrided in length and girth with sterile nail nippers and dremel without iatrogenic  bleeding.  -For tinea pedis, Rx sent to pharmacy for Clotrimazole Cream 1% to be applied twice daily for six weeks. -Patient/POA to call should there be question/concern in the interim.   Return in about 3 months (around 11/23/2021).  Freddie Breech, DPM

## 2021-09-02 ENCOUNTER — Ambulatory Visit (INDEPENDENT_AMBULATORY_CARE_PROVIDER_SITE_OTHER): Payer: Medicaid Other | Admitting: *Deleted

## 2021-09-02 ENCOUNTER — Encounter (HOSPITAL_COMMUNITY): Payer: Self-pay

## 2021-09-02 VITALS — BP 140/92 | HR 80 | Wt 208.0 lb

## 2021-09-02 DIAGNOSIS — F200 Paranoid schizophrenia: Secondary | ICD-10-CM

## 2021-09-02 NOTE — Progress Notes (Signed)
In as planned for his monthly injection for Invega S 156 mg, given today in his L DELTOID. He offers no complaints today, is pleasant and verbal and today he is telling Clinical research associate he wants to go to school to be a speaker, he doesn't need to be p"aid for it but wants to go to [place] and talk about birds and dying" as examples. Asking what classes he would have to take to be a speaker. Writer does not know and told him this and not sure this is realistic for him. Last month he also said he wanted to work. To return in 28 days

## 2021-09-30 ENCOUNTER — Ambulatory Visit (INDEPENDENT_AMBULATORY_CARE_PROVIDER_SITE_OTHER): Payer: Medicaid Other | Admitting: Registered Nurse

## 2021-09-30 ENCOUNTER — Encounter (HOSPITAL_COMMUNITY): Payer: Self-pay | Admitting: Registered Nurse

## 2021-09-30 ENCOUNTER — Ambulatory Visit (INDEPENDENT_AMBULATORY_CARE_PROVIDER_SITE_OTHER): Payer: Medicaid Other | Admitting: *Deleted

## 2021-09-30 VITALS — BP 156/106 | Ht 76.0 in | Wt 210.0 lb

## 2021-09-30 DIAGNOSIS — F200 Paranoid schizophrenia: Secondary | ICD-10-CM

## 2021-09-30 MED ORDER — HALOPERIDOL 5 MG PO TABS: 5.0000 mg | ORAL_TABLET | Freq: Every day | ORAL | 3 refills | Status: AC

## 2021-09-30 MED ORDER — RISPERIDONE 2 MG PO TABS: 2.0000 mg | ORAL_TABLET | Freq: Every day | ORAL | 3 refills | Status: AC

## 2021-09-30 MED ORDER — PALIPERIDONE PALMITATE ER 156 MG/ML IM SUSY: 156.0000 mg | PREFILLED_SYRINGE | INTRAMUSCULAR | 3 refills | Status: AC

## 2021-09-30 NOTE — Progress Notes (Signed)
In for his monthly injection of Invega S 156 mg given today in his R DELTOID. Mom met me in the lobby to tell me and show me he is now on BP meds which are added to his med list. States he doesn't have high BP even though showed him his bp is 165/106 this am. States if he gets sick on it again he will come to me cuz he doesn't have BP issues. States he took the pills once and threw up so not taking it again. He is disorganized and illogical at times with his answers. He was seen by [facility] this am for his every 3 month assessment. To return in one month for  his next shot.

## 2021-09-30 NOTE — Progress Notes (Signed)
BH MD/PA/NP OP Progress Note  09/30/2021 12:48 PM Bob Reyes  MRN:  [id number]  Chief Complaint:  Chief Complaint  Patient presents with   Follow up psyciatric evaluation and administration of long    Follow up psychiatric evaluation and administration of long   HPI: Bob Reyes 41 y.o. male seen face to face in office today for follow-up psychiatric evaluation and administration of long acting injectable.  His psychiatric history includes paranoid schizophrenia.  His mental health is managed with Invega S[place]enna 156 mg every 28 days, Haldol 5 mg daily at bedtime, and Risperdal 2 mg daily at bed time.  He reports medications continues to manage his mental health well without adverse reaction.    He denies depression, anxiety, fluctuations in mood, abnormal movement, suicidal/self-harm/homicidal ideation, psychosis, and paranoia.  AIMS, PHQ 2 & 9, C-SSRS, and GAD 7 screenings conducted by provider see scores below.  He reports eating and sleeping without difficulty.  Elevation of blood pressure and patient has been refusing to take medications for hypertension stating that he did not have high blood pressure.  Education on hypertension and the importance of taking medication given.  Understanding voiced and states he will start taking his medication.    Visit Diagnosis:    ICD-10-CM   1. Paranoid schizophrenia, chronic condition ([place])  F20.0 paliperidone (INVEGA SUSTENNA) 156 MG/ML SUSY injection    risperiDONE (RISPERDAL) 2 MG tablet    haloperidol (HALDOL) 5 MG tablet      Past Psychiatric History: Paranoid schizophrenia, chronic condition  Past Medical History:  Past Medical History:  Diagnosis Date   Closed fracture of bone of left foot    Closed fracture of bone of right foot    Constipation due to opioid therapy 07/05/2016   Diabetes mellitus without complication ([place])    Hypertension    Paranoid schizophrenia, chronic condition ([place]) 06/28/2016    Past Surgical History:   Procedure Laterality Date   ORIF CALCANEOUS FRACTURE Left 06/29/2016   Procedure: OPEN REDUCTION INTERNAL FIXATION (ORIF) CALCANEOUS FRACTURE;  Surgeon: Marybelle Killings, MD;  Location: [place];  Service: Orthopedics;  Laterality: Left;   ORIF CALCANEOUS FRACTURE Right 07/01/2016   Procedure: OPEN REDUCTION INTERNAL FIXATION (ORIF) RIGHT CALCANEOUS FRACTURE;  Surgeon: Marybelle Killings, MD;  Location: [place];  Service: Orthopedics;  Laterality: Right;    Family Psychiatric History: Maternal side of family significant history of schizophrenia  Family History:  Family History  Problem Relation Age of Onset   Healthy Mother    Healthy Father     Social History:  Social History   Socioeconomic History   Marital status: Single    Spouse name: Not on file   Number of children: Not on file   Years of education: Not on file   Highest education level: Not on file  Occupational History   Not on file  Tobacco Use   Smoking status: Never   Smokeless tobacco: Never  Vaping Use   Vaping Use: Never used  Substance and Sexual Activity   Alcohol use: No   Drug use: No   Sexual activity: Not on file  Other Topics Concern   Not on file  Social History Narrative   Not on file   Social Determinants of Health   Financial Resource Strain: Not on file  Food Insecurity: Not on file  Transportation Needs: Not on file  Physical Activity: Not on file  Stress: Not on file  Social Connections: Not on file  Allergies: No Known Allergies  Metabolic Disorder Labs: Lab Results  Component Value Date   HGBA1C 11.7 (H) 11/03/2019   MPG 289.09 11/03/2019   Lab Results  Component Value Date   PROLACTIN 23.5 (H) 06/29/2016   Lab Results  Component Value Date   CHOL 159 06/29/2016   TRIG 225 (H) 11/06/2019   HDL 33 (L) 06/29/2016   CHOLHDL 4.8 06/29/2016   VLDL 36 06/29/2016   LDLCALC 90 06/29/2016     Therapeutic Level Labs: No results found for: "LITHIUM" No results found for:  "VALPROATE" No results found for: "CBMZ"  Current Medications: Current Outpatient Medications  Medication Sig Dispense Refill   amLODipine (NORVASC) 5 MG tablet Take 1 tablet (5 mg total) by mouth daily. 30 tablet 3   blood glucose meter kit and supplies Dispense based on patient and insurance preference. Use up to four times daily as directed. (FOR ICD-10 E10.9, E11.9). (Patient not taking: Reported on 08/23/2021) 1 each 0   clotrimazole (LOTRIMIN) 1 % cream Apply to both feet and between toes twice daily for 6 weeks. 60 g 1   glimepiride (AMARYL) 2 MG tablet Take 2 mg by mouth daily.     haloperidol (HALDOL) 5 MG tablet Take 1 tablet (5 mg total) by mouth at bedtime. 30 tablet 3   insulin degludec (TRESIBA) 100 UNIT/ML FlexTouch Pen Inject 45 Units into the skin in the morning. (Patient not taking: Reported on 08/23/2021) 15 mL 3   Insulin Pen Needle 32G X 4 MM MISC 45 Units by Does not apply route in the morning. (Patient not taking: Reported on 08/23/2021) 100 each 2   ketoconazole (NIZORAL) 2 % cream Apply 1 application topically 2 (two) times daily as needed (facial blemishes).      LANTUS SOLOSTAR 100 UNIT/ML Solostar Pen SMARTSIG:45 Unit(s) SUB-Q Every Morning (Patient not taking: Reported on 08/23/2021)     linagliptin (TRADJENTA) 5 MG TABS tablet Take 1 tablet (5 mg total) by mouth daily. 30 tablet 3   paliperidone (INVEGA SUSTENNA) 156 MG/ML SUSY injection Inject 1 mL (156 mg total) into the muscle every 28 (twenty-eight) days. 1 mL 3   risperiDONE (RISPERDAL) 2 MG tablet Take 1 tablet (2 mg total) by mouth at bedtime. 30 tablet 3   Current Facility-Administered Medications  Medication Dose Route Frequency Provider Last Rate Last Admin   paliperidone (INVEGA SUSTENNA) injection 156 mg  156 mg Intramuscular Q28 days Chiana Wamser B, NP   156 mg at 09/30/21 0846     Musculoskeletal: Strength & Muscle Tone: within normal limits Gait & Station: normal Patient leans: N/A  Psychiatric  Specialty Exam: Review of Systems  Constitutional: Negative.   HENT: Negative.    Eyes: Negative.   Respiratory: Negative.    Cardiovascular: Negative.   Gastrointestinal: Negative.   Genitourinary: Negative.   Musculoskeletal: Negative.   Skin: Negative.   Neurological: Negative.   Hematological: Negative.   Psychiatric/Behavioral:  Negative for agitation, confusion, decreased concentration, dysphoric mood, hallucinations, self-injury, sleep disturbance and suicidal ideas. The patient is not nervous/anxious.     There were no vitals taken for this visit.There is no height or weight on file to calculate BMI.  General Appearance: Casual  Eye Contact:  Good  Speech:  Clear and Coherent  Volume:  Normal  Mood:  Euthymic  Affect:  Appropriate and Congruent  Thought Process:  Coherent and Descriptions of Associations: Intact  Orientation:  Full (Time, Place, and Person)  Thought Content: WDL  Suicidal Thoughts:  No  Homicidal Thoughts:  No  Memory:  Immediate;   Fair Recent;   Fair Remote;   Fair  Judgement:  Fair  Insight:  Fair and Present  Psychomotor Activity:  Normal  Concentration:  Concentration: Good and Attention Span: Good  Recall:  Good  Fund of Knowledge: Fair  Language: Good  Akathisia:  No  Handed:  Right  AIMS (if indicated): done  Assets:  Communication Skills Desire for Improvement Financial Resources/Insurance Housing Social Support  ADL's:  Intact  Cognition: WNL  Sleep:  Good   Screenings: [place] Office Visit from 09/30/2021 in [place] Total Score 0      GAD-7    [place] Office Visit from 09/30/2021 in [facility] Office Visit from 06/01/2021 in [facility] Office Visit from 04/22/2021 in [facility]  Total GAD-7 Score 0 7 12      PHQ2-9    [place] Office Visit from 09/30/2021 in [facility] Office Visit from 06/01/2021 in [facility] Office Visit from 04/22/2021 in [facility]12/2019 [facility]  PHQ-2 Total Score 0 1 2 0  PHQ-9 Total Score -- -- 10 --      [place] Office Visit from 09/30/2021 in [facility] Office Visit from 06/01/2021 in [facility] Office Visit from 04/22/2021 in [place] No Risk No Risk Low Risk      Assessment and Plan: Bob Reyes appears to be doing well.  He reports that medications are effective and managing his mental health without any adverse reaction.  During visit he is dressed appropriate for weather.  He is sitting upright in chair with no noted distress.  He is alert/oriented x 4, calm/cooperative and mood is congruent with affect.  He spoke in a clear tone at moderate volume, and normal pace, with good eye contact.  His thought process is coherent and relevant; and there is no indication that he is currently responding to internal/external stimuli, or experiencing delusional thought content.  He denies depression, anxiety, suicidal/self-harm/homicidal ideation, psychosis, paranoia, and fluctuations in mood.   1. Paranoid schizophrenia, chronic condition ([place]) - paliperidone (INVEGA SUSTENNA) 156 MG/ML SUSY injection; Inject 1 mL (156 mg total) into the muscle every 28 (twenty-eight) days.  Dispense: 1 mL; Refill: 3 - risperiDONE (RISPERDAL) 2 MG tablet; Take 1 tablet (2 mg total) by mouth at bedtime.  Dispense: 30 tablet; Refill: 3 - haloperidol (HALDOL) 5 MG tablet; Take 1 tablet (5 mg total) by mouth at bedtime.  Dispense: 30 tablet; Refill: 3    Collaboration of Care: Collaboration of Care: Medication Management AEB Refill medications and administration of long acting injectable and Psychiatrist  AEB Follow up psychiatric evaluation Meds ordered this encounter  Medications   paliperidone (INVEGA SUSTENNA) 156 MG/ML SUSY injection    Sig: Inject 1 mL (156 mg total) into the muscle every 28 (twenty-eight) days.    Dispense:  1 mL    Refill:  3    Order Specific Question:   Supervising Provider    Answer:   Hampton Abbot [[id number]]   risperiDONE (RISPERDAL) 2 MG tablet    Sig: Take 1 tablet (2 mg total) by mouth at bedtime.    Dispense:  30 tablet    Refill:  3    Order Specific Question:   Supervising Provider    Answer:   Hampton Abbot [[id number]]   haloperidol (HALDOL) 5 MG tablet    Sig: Take 1 tablet (5 mg total) by mouth at bedtime.    Dispense:  30 tablet    Refill:  3    Order Specific Question:   Supervising Provider    Answer:   Hampton Abbot [[id number]]     Patient/Guardian was advised Release of Information must be obtained prior to any record release in order to collaborate their care with an outside provider. Patient/Guardian was advised if they have not already done so to contact the registration department to sign all necessary forms in order for [place] to release information regarding their care.   Consent: Patient/Guardian gives verbal consent for treatment and assignment of benefits for services provided during this visit. Patient/Guardian expressed understanding and agreed to proceed.    Graciana Sessa, NP 09/30/2021, 12:48 PM

## 2021-10-28 ENCOUNTER — Ambulatory Visit (HOSPITAL_COMMUNITY): Payer: Medicaid Other

## 2021-11-04 ENCOUNTER — Ambulatory Visit (HOSPITAL_COMMUNITY): Payer: Medicaid Other

## 2021-11-04 ENCOUNTER — Encounter (HOSPITAL_COMMUNITY): Payer: Self-pay

## 2021-11-04 VITALS — BP 146/107 | HR 64 | Resp 13 | Ht 76.0 in | Wt 214.0 lb

## 2021-11-04 DIAGNOSIS — F200 Paranoid schizophrenia: Secondary | ICD-10-CM

## 2021-11-04 NOTE — Progress Notes (Signed)
Patient presents to the office for his monthly injection of Invega S 156 mg given today in his L DELTOID, he is doing well over all, will return in 28 days.

## 2021-12-02 ENCOUNTER — Ambulatory Visit (HOSPITAL_COMMUNITY): Payer: Medicaid Other

## 2021-12-20 ENCOUNTER — Ambulatory Visit (INDEPENDENT_AMBULATORY_CARE_PROVIDER_SITE_OTHER): Payer: Medicaid Other | Admitting: Podiatry

## 2021-12-20 DIAGNOSIS — Z91199 Patient's noncompliance with other medical treatment and regimen due to unspecified reason: Secondary | ICD-10-CM

## 2021-12-20 NOTE — Progress Notes (Signed)
1. No-show for appointment     

## 2022-10-12 ENCOUNTER — Encounter: Payer: Self-pay | Admitting: Podiatry

## 2022-10-12 ENCOUNTER — Ambulatory Visit (INDEPENDENT_AMBULATORY_CARE_PROVIDER_SITE_OTHER): Payer: MEDICAID | Admitting: Podiatry

## 2022-10-12 DIAGNOSIS — M79675 Pain in left toe(s): Secondary | ICD-10-CM

## 2022-10-12 DIAGNOSIS — M79674 Pain in right toe(s): Secondary | ICD-10-CM

## 2022-10-12 DIAGNOSIS — E119 Type 2 diabetes mellitus without complications: Secondary | ICD-10-CM

## 2022-10-12 DIAGNOSIS — M2141 Flat foot [pes planus] (acquired), right foot: Secondary | ICD-10-CM | POA: Diagnosis not present

## 2022-10-12 DIAGNOSIS — B351 Tinea unguium: Secondary | ICD-10-CM | POA: Diagnosis not present

## 2022-10-12 DIAGNOSIS — M2142 Flat foot [pes planus] (acquired), left foot: Secondary | ICD-10-CM | POA: Diagnosis not present

## 2022-10-12 DIAGNOSIS — E1165 Type 2 diabetes mellitus with hyperglycemia: Secondary | ICD-10-CM

## 2022-10-15 NOTE — Progress Notes (Signed)
ANNUAL DIABETIC FOOT EXAM  Subjective: Bob Reyes presents today annual diabetic foot exam. He is accompanied by his Child psychotherapist on today's visit.  Chief Complaint  Patient presents with   Nail Problem    DFC,Referring Provider Rometta Emery, MD,lov:2 months ago   Patient confirms h/o diabetes.  Patient denies any h/o foot wounds.  Risk factors: diabetes.  Rometta Emery, MD is patient's PCP.  Past Medical History:  Diagnosis Date   Closed fracture of bone of left foot    Closed fracture of bone of right foot    Constipation due to opioid therapy 07/05/2016   Diabetes mellitus without complication (HCC)    Hypertension    Paranoid schizophrenia, chronic condition (HCC) 06/28/2016   Patient Active Problem List   Diagnosis Date Noted   Pain in left ankle and joints of left foot 02/26/2020   DM type 2 (HCC) 11/05/2019   Renal insufficiency 11/05/2019   AKI (acute kidney injury) (HCC) 11/05/2019   LBBB (left bundle branch block) 11/05/2019   Elevated lipase 11/05/2019   DKA (diabetic ketoacidoses) 11/02/2019   Weakness 01/16/2017   Essential hypertension, benign 09/08/2016   S/P ORIF (open reduction internal fixation) fracture 08/19/2016   Constipation due to opioid therapy 07/05/2016   Closed fracture of bone of right foot    Closed fracture of bone of left foot    Paranoid schizophrenia, chronic condition (HCC) 06/28/2016   Bilateral calcaneal fractures 06/27/2016   Past Surgical History:  Procedure Laterality Date   ORIF CALCANEOUS FRACTURE Left 06/29/2016   Procedure: OPEN REDUCTION INTERNAL FIXATION (ORIF) CALCANEOUS FRACTURE;  Surgeon: Eldred Manges, MD;  Location[facility];  Service: Orthopedics;  Laterality: Left;   ORIF CALCANEOUS FRACTURE Right 07/01/2016   Procedure: OPEN REDUCTION INTERNAL FIXATION (ORIF) RIGHT CALCANEOUS FRACTURE;  Surgeon: Eldred Manges, MD;  Location: [facility];  Service: Orthopedics;  Laterality: Right;   Current Outpatient  Medications on File Prior to Visit  Medication Sig Dispense Refill   amLODipine (NORVASC) 5 MG tablet Take 1 tablet (5 mg total) by mouth daily. 30 tablet 3   blood glucose meter kit and supplies Dispense based on patient and insurance preference. Use up to four times daily as directed. (FOR ICD-10 E10.9, E11.9). 1 each 0   clotrimazole (LOTRIMIN) 1 % cream Apply to both feet and between toes twice daily for 6 weeks. 60 g 1   glimepiride (AMARYL) 2 MG tablet Take 2 mg by mouth daily.     haloperidol (HALDOL) 5 MG tablet Take 1 tablet (5 mg total) by mouth at bedtime. 30 tablet 3   insulin degludec (TRESIBA) 100 UNIT/ML FlexTouch Pen Inject 45 Units into the skin in the morning. 15 mL 3   Insulin Pen Needle 32G X 4 MM MISC 45 Units by Does not apply route in the morning. 100 each 2   ketoconazole (NIZORAL) 2 % cream Apply 1 application topically 2 (two) times daily as needed (facial blemishes).      LANTUS SOLOSTAR 100 UNIT/ML Solostar Pen      linagliptin (TRADJENTA) 5 MG TABS tablet Take 1 tablet (5 mg total) by mouth daily. 30 tablet 3   paliperidone (INVEGA SUSTENNA) 156 MG/ML SUSY injection Inject 1 mL (156 mg total) into the muscle every 28 (twenty-eight) days. 1 mL 3   risperiDONE (RISPERDAL) 2 MG tablet Take 1 tablet (2 mg total) by mouth at bedtime. 30 tablet 3   No current facility-administered medications on file prior  to visit.    No Known Allergies Social History   Occupational History   Not on file  Tobacco Use   Smoking status: Never   Smokeless tobacco: Never  Vaping Use   Vaping status: Never Used  Substance and Sexual Activity   Alcohol use: No   Drug use: No   Sexual activity: Not on file   Family History  Problem Relation Age of Onset   Healthy Mother    Healthy Father    Immunization History  Administered Date(s) Administered   Influenza-Unspecified 09/14/2016   PPD Test 07/04/2016, 08/21/2016    Review of Systems: Negative except as noted in the HPI.    Objective: There were no vitals filed for this visit.  Bob Reyes is a pleasant 42 y.o. male in NAD. AAO X 3.  Vascular Examination: Capillary refill time immediate b/l. Vascular status intact b/l with palpable pedal pulses. No pain with calf compression b/l. Skin temperature gradient WNL b/l. No cyanosis or clubbing b/l. No ischemia or gangrene noted b/l. Pedal hair sparse. No edema noted b/l LE.  Neurological Examination: Sensation grossly intact b/l with 10 gram monofilament. Vibratory sensation intact b/l.   Dermatological Examination: Pedal skin with normal turgor, texture and tone b/l.  No open wounds. No interdigital macerations.   Toenails 1-5 b/l thick, discolored, elongated with subungual debris and pain on dorsal palpation.   Minimal hyperkeratosis b/l heels.  Musculoskeletal Examination: Normal muscle strength 5/5 to all lower extremity muscle groups bilaterally. Pes planus deformity noted bilateral LE.Marland Kitchen No pain, crepitus or joint limitation noted with ROM b/l LE.  Patient ambulates independently without assistive aids.  Radiographs: None  ADA Risk Categorization: Low Risk :  Patient has all of the following: Intact protective sensation No prior foot ulcer  No severe deformity Pedal pulses present  Assessment: 1. Pain due to onychomycosis of toenails of both feet   2. Pes planus of both feet   3. Type 2 diabetes mellitus with hyperglycemia, without long-term current use of insulin (HCC)   4. Encounter for diabetic foot exam (HCC)     Plan: -Consent given for treatment as described below: -Examined patient. -Patient to continue soft, supportive shoe gear daily. -Toenails 1-5 b/l were debrided in length and girth with sterile nail nippers and dremel without iatrogenic bleeding.  -Patient/POA to call should there be question/concern in the interim. No follow-ups on file.  Freddie Breech, DPM

## 2023-01-16 ENCOUNTER — Ambulatory Visit (INDEPENDENT_AMBULATORY_CARE_PROVIDER_SITE_OTHER): Payer: MEDICAID | Admitting: Podiatry

## 2023-01-16 DIAGNOSIS — Z91199 Patient's noncompliance with other medical treatment and regimen due to unspecified reason: Secondary | ICD-10-CM

## 2023-01-16 NOTE — Progress Notes (Signed)
No show

## 2023-04-14 ENCOUNTER — Ambulatory Visit: Payer: MEDICAID | Admitting: Orthopaedic Surgery

## 2023-04-18 ENCOUNTER — Ambulatory Visit (INDEPENDENT_AMBULATORY_CARE_PROVIDER_SITE_OTHER): Payer: MEDICAID | Admitting: Podiatry

## 2023-04-18 DIAGNOSIS — Z91199 Patient's noncompliance with other medical treatment and regimen due to unspecified reason: Secondary | ICD-10-CM

## 2023-04-18 NOTE — Progress Notes (Signed)
 1. No-show for appointment

## 2023-05-24 ENCOUNTER — Ambulatory Visit: Payer: MEDICAID | Admitting: Podiatry

## 2023-05-30 ENCOUNTER — Ambulatory Visit (INDEPENDENT_AMBULATORY_CARE_PROVIDER_SITE_OTHER): Payer: MEDICAID | Admitting: Podiatry

## 2023-05-30 ENCOUNTER — Encounter: Payer: Self-pay | Admitting: Podiatry

## 2023-05-30 VITALS — Ht 76.0 in | Wt 214.0 lb

## 2023-05-30 DIAGNOSIS — B351 Tinea unguium: Secondary | ICD-10-CM

## 2023-05-30 DIAGNOSIS — M79674 Pain in right toe(s): Secondary | ICD-10-CM

## 2023-05-30 DIAGNOSIS — M79675 Pain in left toe(s): Secondary | ICD-10-CM | POA: Diagnosis not present

## 2023-05-30 DIAGNOSIS — E1165 Type 2 diabetes mellitus with hyperglycemia: Secondary | ICD-10-CM | POA: Diagnosis not present

## 2023-06-03 NOTE — Progress Notes (Signed)
  Subjective:  Patient ID: Bob Reyes, male    DOB: 1980/11/07,  MRN: [id number]  43 y.o. male presents preventative diabetic foot care and painful, elongated thickened toenails x 10 which are symptomatic when wearing enclosed shoe gear. This interferes with his/her daily activities.  Chief Complaint  Patient presents with   Nail Problem    Pt is here for [facility] unsure of last A1C PCP is Dr Carlton Chick and LOV has been over a year.   New problem(s): None   PCP is Davida Espy, MD.  Review of Systems: Negative except as noted in the HPI.   Objective:  Bob Reyes is a pleasant 43 y.o. male in NAD. AAO x 3.  Vascular Examination: Vascular status intact b/l with palpable pedal pulses. CFT immediate b/l. Pedal hair present. No edema. No pain with calf compression b/l. Skin temperature gradient WNL b/l. No varicosities noted. No cyanosis or clubbing noted.  Neurological Examination: Sensation grossly intact b/l with 10 gram monofilament. Vibratory sensation intact b/l.  Dermatological Examination: Pedal skin with normal turgor, texture and tone b/l. No open wounds nor interdigital macerations noted. Toenails 1-5 b/l thick, discolored, elongated with subungual debris and pain on dorsal palpation. No hyperkeratotic lesions noted b/l.   Musculoskeletal Examination: Muscle strength 5/5 to b/l LE.  No pain, crepitus noted b/l. Pes planus b/l. Patient ambulates independently without assistive aids.   Radiographs: None  Last A1c:       No data to display           Assessment:   1. Pain due to onychomycosis of toenails of both feet   2. Type 2 diabetes mellitus with hyperglycemia, without long-term current use of insulin  [facility])    Plan:  Patient was evaluated and treated. All patient's and/or POA's questions/concerns addressed on today's visit. Mycotic toenails 1-5 debrided in length and girth without incident.  Continue daily foot inspections and monitor blood glucose per  PCP/Endocrinologist's recommendations.Continue soft, supportive shoe gear daily. Report any pedal injuries to medical professional. Call office if there are any quesitons/concerns. -Patient/POA to call should there be question/concern in the interim.  Return in about 3 months (around 08/29/2023).  Luella Sager, DPM      [place] LOCATION: [address] [place] [address]                   Office [phone]   [facility] LOCATION: [address] [place], [place] [address] Office [phone]

## 2023-10-03 ENCOUNTER — Ambulatory Visit: Payer: MEDICAID | Admitting: Podiatry

## 2023-11-07 ENCOUNTER — Ambulatory Visit (INDEPENDENT_AMBULATORY_CARE_PROVIDER_SITE_OTHER): Payer: MEDICAID | Admitting: Podiatry

## 2023-11-07 VITALS — Ht 76.0 in | Wt 214.0 lb

## 2023-11-07 DIAGNOSIS — M79674 Pain in right toe(s): Secondary | ICD-10-CM

## 2023-11-07 DIAGNOSIS — M2142 Flat foot [pes planus] (acquired), left foot: Secondary | ICD-10-CM

## 2023-11-07 DIAGNOSIS — E1165 Type 2 diabetes mellitus with hyperglycemia: Secondary | ICD-10-CM | POA: Diagnosis not present

## 2023-11-07 DIAGNOSIS — M79675 Pain in left toe(s): Secondary | ICD-10-CM

## 2023-11-07 DIAGNOSIS — M2141 Flat foot [pes planus] (acquired), right foot: Secondary | ICD-10-CM | POA: Diagnosis not present

## 2023-11-07 DIAGNOSIS — E119 Type 2 diabetes mellitus without complications: Secondary | ICD-10-CM | POA: Diagnosis not present

## 2023-11-07 DIAGNOSIS — B351 Tinea unguium: Secondary | ICD-10-CM | POA: Diagnosis not present

## 2023-11-08 ENCOUNTER — Encounter: Payer: Self-pay | Admitting: Podiatry

## 2023-11-08 NOTE — Progress Notes (Signed)
  Subjective:  Patient ID: Bob Reyes, male    DOB: 1980/08/12,  MRN: [id number]  Bob Reyes presents to clinic today for for annual diabetic foot examination and painful elongated mycotic toenails 1-5 bilaterally which are tender when wearing enclosed shoe gear. Pain is relieved with periodic professional debridement.  Chief Complaint  Patient presents with   Nail Problem    RM 16 RFC. Pt is not diabetic. PCP-[facility], last visit 07/2023.   New problem(s): None.   PCP is Sim Emery CROME, MD.  No Known Allergies  Review of Systems: Negative except as noted in the HPI.  Objective: No changes noted in today's physical examination. There were no vitals filed for this visit. [name] is a pleasant 43 y.o. male in NAD. AAO x 3.   Diabetic foot exam was performed with the following findings:   Normal sensation of 10g monofilament Intact posterior tibialis and dorsalis pedis pulses Vascular Examination: Capillary refill time immediate b/l. Vascular status intact b/l with palpable pedal pulses. Pedal hair present b/l. No pain with calf compression b/l. Skin temperature gradient WNL b/l. No cyanosis or clubbing b/l. No ischemia or gangrene noted b/l.   Neurological Examination: Sensation grossly intact b/l with 10 gram monofilament. Vibratory sensation intact b/l.   Dermatological Examination: Pedal skin with normal turgor, texture and tone b/l.  No open wounds. No interdigital macerations.   Toenails 1-5 b/l thick, discolored, elongated with subungual debris and pain on dorsal palpation.   No corns, calluses, nor porokeratotic lesions.  Pedal skin noted to be dry b/l lower extremities.  Musculoskeletal Examination: Muscle strength 5/5 to all lower extremity muscle groups bilaterally. No pain, crepitus or joint limitation noted with ROM b/l LE. Pes planus b/l. Patient ambulates independently without assistive aids.  Radiographs: None      Assessment/Plan: 1. Pain due to onychomycosis of toenails of both feet   2. Pes planus of both feet   3. Type 2 diabetes mellitus with hyperglycemia, without long-term current use of insulin  (HCC)   4. Encounter for diabetic foot exam (HCC)   Diabetic foot examination performed today. All patient's and/or POA's questions/concerns addressed on today's visit. Toenails 1-5 debrided in length and girth without incident. Advised patient to use Bag Balm Moisturizer for dry skin daily. Continue foot and shoe inspections daily. Monitor blood glucose per PCP/Endocrinologist's recommendations. Continue soft, supportive shoe gear daily. Report any pedal injuries to medical professional. Call office if there are any questions/concerns. Return in about 3 months (around 02/06/2024).  Delon CROME Merlin, DPM      [place] LOCATION: [address], [place] [address]                   Office [phone]   [facility] LOCATION: [address] [place], [place] [address] Office [phone]

## 2023-12-18 ENCOUNTER — Encounter: Payer: Self-pay | Admitting: Radiology

## 2024-01-05 ENCOUNTER — Emergency Department (HOSPITAL_COMMUNITY): Payer: MEDICAID

## 2024-01-05 ENCOUNTER — Encounter (HOSPITAL_COMMUNITY): Payer: Self-pay | Admitting: Emergency Medicine

## 2024-01-05 ENCOUNTER — Emergency Department (HOSPITAL_COMMUNITY)
Admission: EM | Admit: 2024-01-05 | Discharge: 2024-01-05 | Disposition: A | Discharge status: Home / Self Care | Payer: MEDICAID | Attending: Emergency Medicine | Admitting: Emergency Medicine

## 2024-01-05 DIAGNOSIS — R000 Tachycardia, unspecified: Secondary | ICD-10-CM | POA: Insufficient documentation

## 2024-01-05 DIAGNOSIS — Z79899 Other long term (current) drug therapy: Secondary | ICD-10-CM | POA: Insufficient documentation

## 2024-01-05 DIAGNOSIS — Z794 Long term (current) use of insulin: Secondary | ICD-10-CM | POA: Insufficient documentation

## 2024-01-05 DIAGNOSIS — S46912A Strain of unspecified muscle, fascia and tendon at shoulder and upper arm level, left arm, initial encounter: Secondary | ICD-10-CM | POA: Insufficient documentation

## 2024-01-05 DIAGNOSIS — X58XXXA Exposure to other specified factors, initial encounter: Secondary | ICD-10-CM | POA: Diagnosis not present

## 2024-01-05 DIAGNOSIS — Z7984 Long term (current) use of oral hypoglycemic drugs: Secondary | ICD-10-CM | POA: Diagnosis not present

## 2024-01-05 DIAGNOSIS — S4992XA Unspecified injury of left shoulder and upper arm, initial encounter: Secondary | ICD-10-CM | POA: Diagnosis present

## 2024-01-05 DIAGNOSIS — R079 Chest pain, unspecified: Secondary | ICD-10-CM

## 2024-01-05 DIAGNOSIS — R0789 Other chest pain: Secondary | ICD-10-CM | POA: Diagnosis not present

## 2024-01-05 LAB — BASIC METABOLIC PANEL WITH GFR
Anion gap: 12 (ref 5–15)
BUN: 9 mg/dL (ref 6–20)
CO2: 25 mmol/L (ref 22–32)
Calcium: 9.4 mg/dL (ref 8.9–10.3)
Chloride: 103 mmol/L (ref 98–111)
Creatinine, Ser: 1.1 mg/dL (ref 0.61–1.24)
GFR, Estimated: >60 mL/min (ref >60)
Glucose, Bld: 103 mg/dL — ABNORMAL HIGH (ref 70–99)
Potassium: 4.2 mmol/L (ref 3.5–5.1)
Sodium: 140 mmol/L (ref 135–145)

## 2024-01-05 LAB — CBC WITH DIFFERENTIAL/PLATELET
Abs Immature Granulocytes: 0.04 K/uL (ref 0.00–0.07)
Basophils Absolute: 0.1 K/uL (ref 0.0–0.1)
Basophils Relative: 1 %
Eosinophils Absolute: 0.3 K/uL (ref 0.0–0.5)
Eosinophils Relative: 3 %
HCT: 41.8 % (ref 39.0–52.0)
Hemoglobin: 14.7 g/dL (ref 13.0–17.0)
Immature Granulocytes: 0 %
Lymphocytes Relative: 24 %
Lymphs Abs: 2.8 K/uL (ref 0.7–4.0)
MCH: 28.7 pg (ref 26.0–34.0)
MCHC: 35.2 g/dL (ref 30.0–36.0)
MCV: 81.6 fL (ref 80.0–100.0)
Monocytes Absolute: 0.8 K/uL (ref 0.1–1.0)
Monocytes Relative: 7 %
Neutro Abs: 7.7 K/uL (ref 1.7–7.7)
Neutrophils Relative %: 65 %
Platelets: 265 K/uL (ref 150–400)
RBC: 5.12 MIL/uL (ref 4.22–5.81)
RDW: 12.5 % (ref 11.5–15.5)
WBC: 11.8 K/uL — ABNORMAL HIGH (ref 4.0–10.5)
nRBC: 0 % (ref 0.0–0.2)

## 2024-01-05 LAB — TROPONIN I (HIGH SENSITIVITY): Troponin I (High Sensitivity): 3 ng/L (ref <18)

## 2024-01-05 MED ORDER — AMLODIPINE BESYLATE 2.5 MG PO TABS: 2.5000 mg | ORAL_TABLET | Freq: Every day | ORAL | 0 refills | Status: AC

## 2024-01-05 MED ORDER — IBUPROFEN 800 MG PO TABS
800.0000 mg | ORAL_TABLET | Freq: Once | ORAL | Status: AC
  Administered 2024-01-05: 800 mg via ORAL
  Filled 2024-01-05: qty 1

## 2024-01-05 NOTE — Discharge Instructions (Addendum)
You have been seen today in the Emergency Department (ED)  for dizziness.  Your workup including labs, CT scan of the head and neck, and EKG show reassuring results.  Your symptoms may be due to vertigo, so it is important that you drink plenty of non-alcoholic fluids and take meclizine as needed for dizziness. It is also important to avoid drugs and alcohol.  Please call your regular doctor as soon as possible to schedule the next available clinic appointment to follow up with him/her regarding your visit to the ED and your symptoms. You should ideally follow up with your primary doctor within one week.  I have also given you a referral to cardiology to follow-up with regarding your slow heart rate.  I have given you referral to neurology to follow-up with regarding your dizziness and vertigo.  Return to the Emergency Department (ED)  if you have any syncopal episodes (pass out) or develop ANY severe worsening chest pain, pressure, tightness, palpitations, trouble breathing, sudden sweating, slurred speech, facial droop or other symptoms that concern you.   If you have not heard from the Cardiology office within the next 72 hours please call [phone].

## 2024-01-05 NOTE — ED Notes (Signed)
 Reviewed discharge instructoins with pt. States he understand. Will fill rx and f/u with cardiologist and pcp

## 2024-01-05 NOTE — ED Provider Notes (Signed)
 Received pt in sign out from Zavitz, DO. In short pt is mentally delayed, presents with left shoulder pain he thinks from sleeping on his shoulder weird. Some chest discomfort this morning. None now, no heart problems. Hx BP meds and obese, low risk and well appearing.   Plan: troponin and blood work negative, follow up cardiology outpatient.  Physical Exam  BP (!) 139/113 (BP Location: Right Arm)   Pulse (!) 111   Temp 98.9 F (37.2 C)   Resp 16   SpO2 99%   Physical Exam Constitutional:      Appearance: Normal appearance.  HENT:     Head: Normocephalic.     Nose: Nose normal.     Mouth/Throat:     Mouth: Mucous membranes are moist.  Eyes:     Pupils: Pupils are equal, round, and reactive to light.  Cardiovascular:     Rate and Rhythm: Normal rate and regular rhythm.     Pulses: Normal pulses.     Heart sounds: Normal heart sounds.  Pulmonary:     Effort: Pulmonary effort is normal.     Breath sounds: Normal breath sounds.  Abdominal:     General: Abdomen is flat.  Skin:    Capillary Refill: Capillary refill takes less than 2 seconds.  Neurological:     Mental Status: He is alert and oriented to person, place, and time.     Procedures  Procedures  ED Course / MDM    Medical Decision Making Results of Xray show no abnormality, no fracture or dislocation Troponin WNL - EKG reassuring, no acute pain of chest, unlikely acute pathology or STEMI CBC and CMP reassuring without acute pathology.   Discharge. Pt is appropriate for discharge home and management of symptoms outpatient with strict return precautions. Caregiver agreeable to plan and verbalizes understanding. All questions answered.    Amount and/or Complexity of Data Reviewed Labs: ordered. Decision-making details documented in ED Course.    Details: Reviewed by me Radiology: ordered and independent interpretation performed. Decision-making details documented in ED Course.    Details: Reviewed by  me ECG/medicine tests: ordered and independent interpretation performed. Decision-making details documented in ED Course.  Risk Prescription drug management.          Elyse Prevo E, NP 01/05/24 1717    Chhabra, Anil K, MD 01/05/24 2308

## 2024-01-05 NOTE — ED Triage Notes (Signed)
 Pt here from home with c/o left shoulder pain for 1 week , pt thinks he has been sleeping on it wrong , pt has been off his b/o meds for a while

## 2024-01-05 NOTE — ED Notes (Signed)
Up and ambulated to the rest room without difficulty 

## 2024-01-05 NOTE — ED Provider Notes (Signed)
 [place] EMERGENCY DEPARTMENT AT [facility] Provider Note   CSN: [id number] Arrival date & time: 01/05/24  1115     Patient presents with: Shoulder Pain   Bob Reyes is a 43 y.o. male.   Patient presents with intermittent left shoulder discomfort for 1 week.  Patient feels he has been sleeping on it wrong.  No direct trauma.  At times pain with position currently does not have any pain with movement.  No history of shoulder problems.  No cardiac history.  Patient was on high blood pressure meds in the past but not currently.  Patient in the room with family.  No exertional chest pain.  Patient did have transient/brief chest left upper discomfort he was unsure if it was shoulder related.  No shortness of breath.  The history is provided by the patient.  Shoulder Pain Associated symptoms: no back pain, no fever and no neck pain        Prior to Admission medications   Medication Sig Start Date End Date Taking? Authorizing Provider  amLODipine  (NORVASC ) 5 MG tablet Take 1 tablet (5 mg total) by mouth daily. 11/08/19   Rai, Nydia POUR, MD  blood glucose meter kit and supplies Dispense based on patient and insurance preference. Use up to four times daily as directed. (FOR ICD-10 E10.9, E11.9). 11/07/19   Rai, Nydia POUR, MD  clotrimazole  (LOTRIMIN ) 1 % cream Apply to both feet and between toes twice daily for 6 weeks. 08/23/21   Gaynel Delon CROME, DPM  glimepiride (AMARYL) 2 MG tablet Take 2 mg by mouth daily. 12/29/19   [provider]  haloperidol  (HALDOL ) 5 MG tablet Take 1 tablet (5 mg total) by mouth at bedtime. 09/30/21   Rankin, Shuvon B, NP  insulin  degludec (TRESIBA ) 100 UNIT/ML FlexTouch Pen Inject 45 Units into the skin in the morning. 11/07/19   Rai, Nydia POUR, MD  Insulin  Pen Needle 32G X 4 MM MISC 45 Units by Does not apply route in the morning. 11/07/19   Rai, Ripudeep K, MD  ketoconazole (NIZORAL) 2 % cream Apply 1 application topically 2 (two) times  daily as needed (facial blemishes).  05/30/19   [provider]  LANTUS  SOLOSTAR 100 UNIT/ML Solostar Pen  06/05/20   [provider]  linagliptin  (TRADJENTA ) 5 MG TABS tablet Take 1 tablet (5 mg total) by mouth daily. 11/07/19   Rai, Nydia POUR, MD  paliperidone  (INVEGA  SUSTENNA) 156 MG/ML SUSY injection Inject 1 mL (156 mg total) into the muscle every 28 (twenty-eight) days. 09/30/21   Rankin, Shuvon B, NP  risperiDONE  (RISPERDAL ) 2 MG tablet Take 1 tablet (2 mg total) by mouth at bedtime. Patient not taking: Reported on 11/07/2023 09/30/21 09/30/22  Rankin, Shuvon B, NP    Allergies: Patient has no known allergies.    Review of Systems  Constitutional:  Negative for chills and fever.  HENT:  Negative for congestion.   Eyes:  Negative for visual disturbance.  Respiratory:  Negative for shortness of breath.   Cardiovascular:  Positive for chest pain. Negative for leg swelling.  Gastrointestinal:  Negative for abdominal pain and vomiting.  Genitourinary:  Negative for dysuria and flank pain.  Musculoskeletal:  Negative for back pain, neck pain and neck stiffness.  Skin:  Negative for rash.  Neurological:  Negative for light-headedness and headaches.    Updated Vital Signs BP (!) 139/113 (BP Location: Right Arm)   Pulse (!) 111   Temp 98.9 F (37.2 C)  Resp 16   SpO2 99%   Physical Exam Vitals and nursing note reviewed.  Constitutional:      General: He is not in acute distress.    Appearance: He is well-developed.  HENT:     Head: Normocephalic and atraumatic.     Mouth/Throat:     Mouth: Mucous membranes are moist.  Eyes:     General:        Right eye: No discharge.        Left eye: No discharge.     Conjunctiva/sclera: Conjunctivae normal.  Neck:     Trachea: No tracheal deviation.  Cardiovascular:     Rate and Rhythm: Regular rhythm. Tachycardia present.     Heart sounds: No murmur heard. Pulmonary:     Effort: Pulmonary effort is normal.     Breath  sounds: Normal breath sounds.  Abdominal:     General: There is no distension.     Palpations: Abdomen is soft.     Tenderness: There is no abdominal tenderness. There is no guarding.  Musculoskeletal:        General: No swelling.     Cervical back: Normal range of motion and neck supple. No rigidity.     Comments: Patient has full range of motion with abduction, external/internal rotation, adduction without significant discomfort.  Patient normal strength with flexion and abduction.  Neurovasc intact left arm.  Patient points to anterior left shoulder where discomfort is at times with position.  Skin:    General: Skin is warm.     Capillary Refill: Capillary refill takes less than 2 seconds.     Findings: No rash.  Neurological:     General: No focal deficit present.     Mental Status: He is alert.     Cranial Nerves: No cranial nerve deficit.  Psychiatric:        Mood and Affect: Mood normal.     (all labs ordered are listed, but only abnormal results are displayed) Labs Reviewed  CBC WITH DIFFERENTIAL/PLATELET  BASIC METABOLIC PANEL WITH GFR  TROPONIN I (HIGH SENSITIVITY)    EKG: EKG Interpretation Date/Time:  Friday January 05 2024 14:53:44 EST Ventricular Rate:  65 PR Interval:  164 QRS Duration:  83 QT Interval:  385 QTC Calculation: 401 R Axis:   19  Text Interpretation: Sinus rhythm Minimal ST elevation, anterior leads Confirmed by Tonia Chew [phone]) on 01/05/2024 3:00:28 PM  Radiology: DG Shoulder Left Result Date: 01/05/2024 EXAM: 1 VIEW(S) XRAY OF THE LEFT SHOULDER 01/05/2024 11:47:00 AM COMPARISON: None available. CLINICAL HISTORY: Pain FINDINGS: BONES AND JOINTS: Glenohumeral joint is normally aligned. No acute fracture or dislocation. The [facility] joint is unremarkable in appearance. SOFT TISSUES: No abnormal calcifications. Visualized lung is unremarkable. IMPRESSION: 1. No significant abnormality. Electronically signed by: Lynwood Seip MD 01/05/2024 12:20 PM  EST RP Workstation: [id number]     Procedures   Medications Ordered in the ED  ibuprofen  (ADVIL ) tablet 800 mg (800 mg Oral Given 01/05/24 1141)                                    Medical Decision Making Amount and/or Complexity of Data Reviewed Labs: ordered. Radiology: ordered. ECG/medicine tests: ordered.  Risk Prescription drug management.   Patient presents with primary concern for left shoulder discomfort without significant trauma.  Discussed differential including bursitis, rotator cuff strain.  X-ray independently reviewed no acute fracture or  dislocation.  Other differential include atypical cardiac, no chest pain or chest pressure at this time however patient did have brief episode unsure if it was similar to shoulder discomfort.  Patient low risk cardiac.  Plan for basic blood work, single troponin and if unremarkable close follow-up with cardiology and primary care doctor outpatient.  EKG overall unremarkable minimal J-point/ST elevation anteriorly.  Plan for screening blood work and troponin.  If unremarkable and patient continues to not have chest pain we stable for outpatient follow-up with cardiology and primary doctor.      Final diagnoses:  Left shoulder strain, initial encounter  Acute chest pain    ED Discharge Orders          Ordered    Ambulatory referral to Cardiology       Comments: If you have not heard from the Cardiology office within the next 72 hours please call [phone].   01/05/24 1503               Tonia Chew, MD 01/05/24 [phone]

## 2024-01-05 NOTE — ED Notes (Signed)
 Kaitlyn np aware of BP

## 2024-02-21 ENCOUNTER — Encounter: Payer: Self-pay | Admitting: Podiatry

## 2024-02-21 ENCOUNTER — Ambulatory Visit: Payer: MEDICAID | Admitting: Podiatry

## 2024-02-21 DIAGNOSIS — B351 Tinea unguium: Secondary | ICD-10-CM

## 2024-02-21 DIAGNOSIS — E1165 Type 2 diabetes mellitus with hyperglycemia: Secondary | ICD-10-CM | POA: Diagnosis not present

## 2024-02-21 DIAGNOSIS — M79675 Pain in left toe(s): Secondary | ICD-10-CM

## 2024-02-21 DIAGNOSIS — M79674 Pain in right toe(s): Secondary | ICD-10-CM | POA: Diagnosis not present

## 2024-02-25 NOTE — Progress Notes (Signed)
"  °  Subjective:  Patient ID: Bob Reyes, male    DOB: 02/05/1981,  MRN: [id number]  Bob Reyes presents to clinic today for. preventative diabetic foot care for painful thick toenails that are difficult to trim. Pain interferes with ambulation. Aggravating factors include wearing enclosed shoe gear. Pain is relieved with periodic professional debridement.   PCP is Sim Emery CROME, MD.  Allergies[1]  Review of Systems: Negative except as noted in the HPI.  Objective:  There were no vitals filed for this visit. Bob Reyes is a pleasant 44 y.o. male in NAD. AAO x 3.  Normal sensation of 10g monofilament Intact posterior tibialis and dorsalis pedis pulses Vascular Examination: Capillary refill time immediate b/l. Vascular status intact b/l with palpable pedal pulses. Pedal hair present b/l. No pain with calf compression b/l. Skin temperature gradient WNL b/l. No cyanosis or clubbing b/l. No ischemia or gangrene noted b/l.   Neurological Examination: Sensation grossly intact b/l with 10 gram monofilament. Vibratory sensation intact b/l.   Dermatological Examination: Pedal skin with normal turgor, texture and tone b/l.  No open wounds. No interdigital macerations.   Toenails 1-5 b/l thick, discolored, elongated with subungual debris and pain on dorsal palpation.   No corns, calluses, nor porokeratotic lesions.  Musculoskeletal Examination: Muscle strength 5/5 to all lower extremity muscle groups bilaterally. No pain, crepitus or joint limitation noted with ROM b/l LE. Pes planus b/l. Patient ambulates independently without assistive aids.  Radiographs: None  Assessment/Plan: 1. Pain due to onychomycosis of toenails of both feet   2. Type 2 diabetes mellitus with hyperglycemia, without long-term current use of insulin  [facility])   Patient was evaluated and treated. All patient's and/or POA's questions/concerns addressed on today's visit. Mycotic toenails 1-5 b/l debrided in length  and girth without incident.  Treatment was provided by assistant Mable FABIENE Cherry under my supervision.Continue daily foot inspections and monitor blood glucose per PCP/Endocrinologist's recommendations.Continue soft, supportive shoe gear daily. Report any pedal injuries to medical professional. Call office if there are any quesitons/concerns. -Patient/POA to call should there be question/concern in the interim.   Return in about 3 months (around 05/21/2024).  Delon CROME Merlin, DPM      [place] LOCATION: [address] [place] [address]                   Office [phone]   [facility] LOCATION: [address] [place], [place] [address] Office [phone]     [1] No Known Allergies  "

## 2024-05-28 ENCOUNTER — Ambulatory Visit: Payer: MEDICAID | Admitting: Podiatry
